# Patient Record
Sex: Male | Born: 1950 | Race: Black or African American | Hispanic: No | Marital: Single | Smoking: Former smoker
Health system: Southern US, Community
[De-identification: ages and names within clinical notes are randomized; demographics above are authoritative.]

## PROBLEM LIST (undated history)

## (undated) DIAGNOSIS — N183 Chronic kidney disease, stage 3 unspecified: Secondary | ICD-10-CM

## (undated) DIAGNOSIS — I340 Nonrheumatic mitral (valve) insufficiency: Secondary | ICD-10-CM

## (undated) DIAGNOSIS — F1011 Alcohol abuse, in remission: Secondary | ICD-10-CM

## (undated) DIAGNOSIS — I509 Heart failure, unspecified: Secondary | ICD-10-CM

## (undated) DIAGNOSIS — Z953 Presence of xenogenic heart valve: Secondary | ICD-10-CM

## (undated) DIAGNOSIS — I5042 Chronic combined systolic (congestive) and diastolic (congestive) heart failure: Secondary | ICD-10-CM

## (undated) DIAGNOSIS — I10 Essential (primary) hypertension: Secondary | ICD-10-CM

## (undated) DIAGNOSIS — C61 Malignant neoplasm of prostate: Secondary | ICD-10-CM

## (undated) HISTORY — PX: PROSTATE SURGERY: SHX751

## (undated) HISTORY — PX: APPENDECTOMY: SHX54

## (undated) HISTORY — DX: Chronic combined systolic (congestive) and diastolic (congestive) heart failure: I50.42

---

## 2016-04-26 ENCOUNTER — Emergency Department (HOSPITAL_COMMUNITY): Payer: Medicare Other

## 2016-04-26 ENCOUNTER — Encounter (HOSPITAL_COMMUNITY): Payer: Self-pay

## 2016-04-26 ENCOUNTER — Inpatient Hospital Stay (HOSPITAL_COMMUNITY)
Admission: EM | Admit: 2016-04-26 | Discharge: 2016-04-28 | DRG: 291 | Disposition: A | Discharge status: Home Health | Payer: Medicare Other | Attending: Cardiology | Admitting: Cardiology

## 2016-04-26 DIAGNOSIS — I130 Hypertensive heart and chronic kidney disease with heart failure and stage 1 through stage 4 chronic kidney disease, or unspecified chronic kidney disease: Secondary | ICD-10-CM | POA: Diagnosis not present

## 2016-04-26 DIAGNOSIS — I5021 Acute systolic (congestive) heart failure: Secondary | ICD-10-CM | POA: Diagnosis present

## 2016-04-26 DIAGNOSIS — R0602 Shortness of breath: Secondary | ICD-10-CM | POA: Diagnosis not present

## 2016-04-26 DIAGNOSIS — I5043 Acute on chronic combined systolic (congestive) and diastolic (congestive) heart failure: Secondary | ICD-10-CM | POA: Diagnosis present

## 2016-04-26 DIAGNOSIS — I340 Nonrheumatic mitral (valve) insufficiency: Secondary | ICD-10-CM | POA: Diagnosis present

## 2016-04-26 DIAGNOSIS — I272 Other secondary pulmonary hypertension: Secondary | ICD-10-CM | POA: Diagnosis present

## 2016-04-26 DIAGNOSIS — I472 Ventricular tachycardia: Secondary | ICD-10-CM | POA: Diagnosis present

## 2016-04-26 DIAGNOSIS — D638 Anemia in other chronic diseases classified elsewhere: Secondary | ICD-10-CM | POA: Diagnosis present

## 2016-04-26 DIAGNOSIS — N183 Chronic kidney disease, stage 3 unspecified: Secondary | ICD-10-CM | POA: Diagnosis present

## 2016-04-26 DIAGNOSIS — Z823 Family history of stroke: Secondary | ICD-10-CM

## 2016-04-26 DIAGNOSIS — I509 Heart failure, unspecified: Secondary | ICD-10-CM

## 2016-04-26 DIAGNOSIS — Z8546 Personal history of malignant neoplasm of prostate: Secondary | ICD-10-CM

## 2016-04-26 DIAGNOSIS — I5031 Acute diastolic (congestive) heart failure: Secondary | ICD-10-CM | POA: Diagnosis present

## 2016-04-26 DIAGNOSIS — I071 Rheumatic tricuspid insufficiency: Secondary | ICD-10-CM | POA: Diagnosis present

## 2016-04-26 DIAGNOSIS — Z7982 Long term (current) use of aspirin: Secondary | ICD-10-CM

## 2016-04-26 DIAGNOSIS — Z87891 Personal history of nicotine dependence: Secondary | ICD-10-CM

## 2016-04-26 DIAGNOSIS — E785 Hyperlipidemia, unspecified: Secondary | ICD-10-CM | POA: Diagnosis present

## 2016-04-26 DIAGNOSIS — Z8249 Family history of ischemic heart disease and other diseases of the circulatory system: Secondary | ICD-10-CM

## 2016-04-26 DIAGNOSIS — I5023 Acute on chronic systolic (congestive) heart failure: Secondary | ICD-10-CM | POA: Diagnosis present

## 2016-04-26 DIAGNOSIS — N179 Acute kidney failure, unspecified: Secondary | ICD-10-CM | POA: Diagnosis present

## 2016-04-26 DIAGNOSIS — E7800 Pure hypercholesterolemia, unspecified: Secondary | ICD-10-CM | POA: Diagnosis present

## 2016-04-26 HISTORY — DX: Chronic kidney disease, stage 3 unspecified: N18.30

## 2016-04-26 HISTORY — DX: Malignant neoplasm of prostate: C61

## 2016-04-26 HISTORY — DX: Nonrheumatic mitral (valve) insufficiency: I34.0

## 2016-04-26 HISTORY — DX: Heart failure, unspecified: I50.9

## 2016-04-26 HISTORY — DX: Essential (primary) hypertension: I10

## 2016-04-26 HISTORY — DX: Chronic kidney disease, stage 3 (moderate): N18.3

## 2016-04-26 HISTORY — DX: Alcohol abuse, in remission: F10.11

## 2016-04-26 LAB — CBC WITH DIFFERENTIAL/PLATELET
BASOS PCT: 0 %
Basophils Absolute: 0 10*3/uL (ref 0.0–0.1)
Eosinophils Absolute: 0 10*3/uL (ref 0.0–0.7)
Eosinophils Relative: 1 %
HEMATOCRIT: 33.3 % — AB (ref 39.0–52.0)
HEMOGLOBIN: 10.8 g/dL — AB (ref 13.0–17.0)
LYMPHS PCT: 27 %
Lymphs Abs: 1.3 10*3/uL (ref 0.7–4.0)
MCH: 34.2 pg — ABNORMAL HIGH (ref 26.0–34.0)
MCHC: 32.4 g/dL (ref 30.0–36.0)
MCV: 105.4 fL — AB (ref 78.0–100.0)
MONO ABS: 0.6 10*3/uL (ref 0.1–1.0)
MONOS PCT: 11 %
NEUTROS ABS: 3 10*3/uL (ref 1.7–7.7)
NEUTROS PCT: 61 %
Platelets: 191 10*3/uL (ref 150–400)
RBC: 3.16 MIL/uL — ABNORMAL LOW (ref 4.22–5.81)
RDW: 13.8 % (ref 11.5–15.5)
WBC: 4.9 10*3/uL (ref 4.0–10.5)

## 2016-04-26 LAB — I-STAT TROPONIN, ED
Troponin i, poc: 0.03 ng/mL (ref 0.00–0.08)
Troponin i, poc: 0.04 ng/mL (ref 0.00–0.08)

## 2016-04-26 LAB — COMPREHENSIVE METABOLIC PANEL
ALBUMIN: 2.8 g/dL — AB (ref 3.5–5.0)
ALK PHOS: 56 U/L (ref 38–126)
ALT: 20 U/L (ref 17–63)
ANION GAP: 10 (ref 5–15)
AST: 33 U/L (ref 15–41)
BILIRUBIN TOTAL: 1 mg/dL (ref 0.3–1.2)
BUN: 34 mg/dL — AB (ref 6–20)
CALCIUM: 8.9 mg/dL (ref 8.9–10.3)
CO2: 24 mmol/L (ref 22–32)
Chloride: 100 mmol/L — ABNORMAL LOW (ref 101–111)
Creatinine, Ser: 1.66 mg/dL — ABNORMAL HIGH (ref 0.61–1.24)
GFR calc Af Amer: 48 mL/min — ABNORMAL LOW (ref >60)
GFR, EST NON AFRICAN AMERICAN: 42 mL/min — AB (ref >60)
GLUCOSE: 92 mg/dL (ref 65–99)
Potassium: 4.8 mmol/L (ref 3.5–5.1)
Sodium: 134 mmol/L — ABNORMAL LOW (ref 135–145)
TOTAL PROTEIN: 6.9 g/dL (ref 6.5–8.1)

## 2016-04-26 LAB — BRAIN NATRIURETIC PEPTIDE: B NATRIURETIC PEPTIDE 5: 2761.6 pg/mL — AB (ref 0.0–100.0)

## 2016-04-26 MED ORDER — ACETAMINOPHEN 325 MG PO TABS
650.0000 mg | ORAL_TABLET | ORAL | Status: DC | PRN
  Administered 2016-04-27: 650 mg via ORAL
  Filled 2016-04-26: qty 2

## 2016-04-26 MED ORDER — HEPARIN SODIUM (PORCINE) 5000 UNIT/ML IJ SOLN
5000.0000 [IU] | Freq: Three times a day (TID) | INTRAMUSCULAR | Status: DC
  Administered 2016-04-26 – 2016-04-28 (×5): 5000 [IU] via SUBCUTANEOUS
  Filled 2016-04-26 (×5): qty 1

## 2016-04-26 MED ORDER — FUROSEMIDE 10 MG/ML IJ SOLN
40.0000 mg | Freq: Once | INTRAMUSCULAR | Status: AC
  Administered 2016-04-26: 40 mg via INTRAVENOUS
  Filled 2016-04-26: qty 4

## 2016-04-26 MED ORDER — ISOSORBIDE MONONITRATE ER 30 MG PO TB24
30.0000 mg | ORAL_TABLET | Freq: Every day | ORAL | Status: DC
  Administered 2016-04-27 – 2016-04-28 (×2): 30 mg via ORAL
  Filled 2016-04-26 (×2): qty 1

## 2016-04-26 MED ORDER — FUROSEMIDE 10 MG/ML IJ SOLN
40.0000 mg | Freq: Two times a day (BID) | INTRAMUSCULAR | Status: DC
  Administered 2016-04-26 – 2016-04-27 (×2): 40 mg via INTRAVENOUS
  Filled 2016-04-26 (×2): qty 4

## 2016-04-26 MED ORDER — SODIUM CHLORIDE 0.9% FLUSH
3.0000 mL | Freq: Two times a day (BID) | INTRAVENOUS | Status: DC
  Administered 2016-04-27 – 2016-04-28 (×4): 3 mL via INTRAVENOUS

## 2016-04-26 MED ORDER — ADULT MULTIVITAMIN W/MINERALS CH
1.0000 | ORAL_TABLET | Freq: Every day | ORAL | Status: DC
  Administered 2016-04-27 – 2016-04-28 (×2): 1 via ORAL
  Filled 2016-04-26 (×2): qty 1

## 2016-04-26 MED ORDER — ASPIRIN EC 81 MG PO TBEC
81.0000 mg | DELAYED_RELEASE_TABLET | Freq: Every day | ORAL | Status: DC
  Administered 2016-04-27 – 2016-04-28 (×2): 81 mg via ORAL
  Filled 2016-04-26 (×2): qty 1

## 2016-04-26 MED ORDER — GUAIFENESIN-DM 100-10 MG/5ML PO SYRP
5.0000 mL | ORAL_SOLUTION | ORAL | Status: DC | PRN
  Administered 2016-04-26 – 2016-04-27 (×2): 5 mL via ORAL
  Filled 2016-04-26 (×2): qty 5

## 2016-04-26 MED ORDER — SODIUM CHLORIDE 0.9% FLUSH: 3.0000 mL | INTRAVENOUS | Status: DC | PRN

## 2016-04-26 MED ORDER — VITAMIN B-1 100 MG PO TABS
100.0000 mg | ORAL_TABLET | Freq: Every day | ORAL | Status: DC
  Administered 2016-04-27 – 2016-04-28 (×2): 100 mg via ORAL
  Filled 2016-04-26: qty 1

## 2016-04-26 MED ORDER — ATORVASTATIN CALCIUM 40 MG PO TABS
40.0000 mg | ORAL_TABLET | Freq: Every day | ORAL | Status: DC
  Administered 2016-04-27 – 2016-04-28 (×2): 40 mg via ORAL
  Filled 2016-04-26 (×2): qty 1

## 2016-04-26 MED ORDER — POTASSIUM CHLORIDE CRYS ER 20 MEQ PO TBCR
20.0000 meq | EXTENDED_RELEASE_TABLET | Freq: Two times a day (BID) | ORAL | Status: DC
  Administered 2016-04-26 – 2016-04-28 (×4): 20 meq via ORAL
  Filled 2016-04-26 (×4): qty 1

## 2016-04-26 MED ORDER — CARVEDILOL 3.125 MG PO TABS
3.1250 mg | ORAL_TABLET | Freq: Two times a day (BID) | ORAL | Status: DC
  Administered 2016-04-26 – 2016-04-28 (×4): 3.125 mg via ORAL
  Filled 2016-04-26 (×5): qty 1

## 2016-04-26 MED ORDER — SODIUM CHLORIDE 0.9 % IV SOLN: 250.0000 mL | INTRAVENOUS | Status: DC | PRN

## 2016-04-26 MED ORDER — ONDANSETRON HCL 4 MG/2ML IJ SOLN: 4.0000 mg | Freq: Four times a day (QID) | INTRAMUSCULAR | Status: DC | PRN

## 2016-04-26 MED ORDER — HYDRALAZINE HCL 25 MG PO TABS
25.0000 mg | ORAL_TABLET | Freq: Three times a day (TID) | ORAL | Status: DC
  Administered 2016-04-26: 25 mg via ORAL
  Filled 2016-04-26 (×2): qty 1

## 2016-04-26 MED ORDER — FOLIC ACID 1 MG PO TABS
1.0000 mg | ORAL_TABLET | Freq: Every day | ORAL | Status: DC
  Administered 2016-04-27 – 2016-04-28 (×2): 1 mg via ORAL
  Filled 2016-04-26 (×2): qty 1

## 2016-04-26 NOTE — ED Triage Notes (Signed)
Pt presents with increasing shortness of breath and facial edema since being discharged from [facility] on 8/23.  Pt had follow up appointment yesterday, family was called this morning with BNP over 4000.  Pt reports intermittent chest pressure and dry cough.  Lasix started yesterday.

## 2016-04-26 NOTE — ED Notes (Signed)
Report attempted 

## 2016-04-26 NOTE — H&P (Signed)
Patient ID: Aaron Bennett MRN: [id number], DOB/AGE: 65-Jun-1952   Admit date: 04/26/2016   Primary Physician: No primary care provider on file. Primary Cardiologist: New  Pt. Profile:  Aaron Bennett is a 65 y.o. male with a history of HTN, HLD, prostate cancer, CKD stage 3 and recently dx CHF who presented to [facility] ED for evaluation of worsening shortness of breath.  HPI: As above. The patient was a heavy alcohol drinker quit in July 2017. He was admitted to [facility] 8/20- 8/23 for CHF. He was initially presented for altered mental status and worsening shortness of breath. Episode of  Alcohol withdrawal seizure.  He was treated with IV diuretics and this did not prescribe at discharge due to increased creatinine. Echocardiogram shows slow ventricular function of 30-35% with severe MR and TR. Advised to follow-up with cardiology as outpatient. The patient was started on Imdur, spironolactone, Coreg, statin, aspirin and discharged home.                                  Patient yesterday seen by primary care provider and complained of shortness of breath and Lasix was started.  Patient came to live [place] with her sister after PCP visit. Lastand had a worsening shortness of breath with orthopnea and PND. His lower extremity edema improved per sister. Family was called this morning with BNP over 4000 leading to ER presentation. Patient admits to having intermittent chest pain mostly with cough, improved lower extremity edema and abdominal fullness. The patient denies syncope, melena or blood in his stool or urine.  Workup revealed BNP of 2761. Point-of-care troponin negative x 2. Serum creatinine of 1.6 his GFR 48. Hemoglobin of 10.8. Chest x-ray shows evidence of CHF.  50 pack year history of tobacco smoking. Last smoke prior to recent admission.  Problem List  Past Medical History:  Diagnosis Date  . CHF (congestive heart failure) ([place])   . CKD (chronic kidney disease)  stage 3, GFR 30-59 ml/min   . H/O ETOH abuse   . Hypertension   . Prostate cancer [facility])     Past Surgical History:  Procedure Laterality Date  . APPENDECTOMY    . PROSTATE SURGERY       Allergies  No Known Allergies   Home Medications  Prior to Admission medications   Medication Sig Start Date End Date Taking? Authorizing Provider  aspirin EC 81 MG tablet Take 81 mg by mouth daily.   Yes Historical Provider, MD  atorvastatin (LIPITOR) 40 MG tablet Take 40 mg by mouth daily.   Yes Historical Provider, MD  carvedilol (COREG) 3.125 MG tablet Take 3.125 mg by mouth 2 (two) times daily with a meal.   Yes Historical Provider, MD  folic acid (FOLVITE) 1 MG tablet Take 1 mg by mouth daily.   Yes Historical Provider, MD  furosemide (LASIX) 20 MG tablet Take 20 mg by mouth daily.   Yes Historical Provider, MD  isosorbide mononitrate (IMDUR) 30 MG 24 hr tablet Take 30 mg by mouth daily.   Yes Historical Provider, MD  Multiple Vitamin (MULTIVITAMIN) tablet Take 1 tablet by mouth daily.   Yes Historical Provider, MD  potassium chloride SA (K-DUR,KLOR-CON) 20 MEQ tablet Take 20 mEq by mouth 2 (two) times daily.   Yes Historical Provider, MD  spironolactone (ALDACTONE) 25 MG tablet Take 25 mg by mouth daily.   Yes Historical Provider, MD  thiamine (VITAMIN  B-1) 100 MG tablet Take 100 mg by mouth daily.   Yes Historical Provider, MD    Family History  Family History  Problem Relation Age of Onset  . Lung disease Mother   . Stroke Father 55  . Yves Dill Parkinson White syndrome Brother    Family Status  Relation Status  . Mother Deceased at age 59  . Father Deceased at age 1  . Brother Deceased at age 52   with WPW    Social History  Social History   Social History  . Marital status: Single    Spouse name: N/A  . Number of children: N/A  . Years of education: N/A   Occupational History  . Not on file.   Social History Main Topics  . Smoking status: Former Smoker     Packs/day: 1.00    Years: 50.00    Quit date: 04/16/2016  . Smokeless tobacco: Never Used  . Alcohol use Yes     Comment: 1/2 pine  . Drug use: Unknown  . Sexual activity: Not on file   Other Topics Concern  . Not on file   Social History Narrative  . No narrative on file      All other systems reviewed and are otherwise negative except as noted above.  Physical Exam  Blood pressure 117/86, pulse 96, temperature 97.9 F (36.6 C), temperature source Oral, resp. rate 18, height 5\' 11"  (1.803 m), weight 152 lb (68.9 kg), SpO2 100 %.  General: Frail NAD Psych: Normal affect. Neuro: Alert and oriented X 3. Moves all extremities spontaneously. HEENT: Normal  Neck: Supple without bruits. + JVD Lungs:  Resp regular and unlabored.  Diminished breath sounds with faint bibasilar rales. Heart: RRR no s3, s4. Systolic murmurs III/VI. Abdomen: Soft, non-tender, non-distended, BS + x 4.  Extremities: No clubbing, cyanosis. Trace to 1+ edema. DP/PT/Radials 2+ and equal bilaterally.  Labs  No results for input(s): CKTOTAL, CKMB, TROPONINI in the last 72 hours. Lab Results  Component Value Date   WBC 4.9 04/26/2016   HGB 10.8 (L) 04/26/2016   HCT 33.3 (L) 04/26/2016   MCV 105.4 (H) 04/26/2016   PLT 191 04/26/2016     Recent Labs Lab 04/26/16 1129  NA 134*  K 4.8  CL 100*  CO2 24  BUN 34*  CREATININE 1.66*  CALCIUM 8.9  PROT 6.9  BILITOT 1.0  ALKPHOS 56  ALT 20  AST 33  GLUCOSE 92   No results found for: CHOL, HDL, LDLCALC, TRIG No results found for: DDIMER   Radiology/Studies  Dg Chest 2 View  Result Date: 04/26/2016 CLINICAL DATA:  Cough for a week. EXAM: CHEST  2 VIEW COMPARISON:  None. FINDINGS: Cardiac silhouette is mild-to-moderately enlarged. No mediastinal or hilar masses or evidence of adenopathy. There are thickened interstitial markings bilaterally. No focal lung consolidation. No pleural effusion or pneumothorax. Skeletal structures are intact. IMPRESSION:  1. Findings consistent with congestive heart failure with interstitial pulmonary edema. No evidence of pneumonia. Electronically Signed   By: Lajean Manes M.D.   On: 04/26/2016 11:46    ECG  Pending  ASSESSMENT AND PLAN  1. Acute CHF - BNP of 2761 here. Recent admission for CHF however worsening shortness of breath with orthopnea and PND. He did felt better after "starting Lasix by PCP ". Records requested. 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 Given IV lasix 40mg  x 1 in ER with 635ml diuresis.Start him on IV Lasix 40 twice a day and get echocardiogram. Strict  I&O and daily wt. Discontinue spironolactone. Start hydralazine. Continue Imdur and Coreg.  2. Acute on CKD stage II likely - Follow kidney function closely with diuresis.   3. HTN - Stable  4. 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 Follow hemoglobin closely.  Signed, Leanor Kail, PA-C 04/26/2016, 2:32 PM Pager [phone]  Patient seen and examined and history reviewed. Agree with above findings and plan. 65 yo WM seen with his sister. History of long standing Etoh abuse (at least 1/2 pint/day for over 2 years). Admitted one week ago in [place] with acute CHF. Diuresed with IV lasix. Echo showed EF 30-35% with severe MR and TR. Also had CKD with creatinine 1.48. Discharged on Coreg, nitrates and aldactone but no lasix due to concern about renal function. Shortly after discharge he had recurrent symptoms of SOB, cough, orthopnea, and increased abdominal swelling and edema. Reports abstinence from alcohol this past week. Now moved here to live with his sister.  On exam patient is a pleasant BM in NAD. NSR Positive JVD to jaw Lungs with basilar rales.  CV with grade 3/6 holosystolic murmur at the apex. Prominent apical impulse. Abdomen distended. 1+ edema.  Labs reviewed as noted above. Creatinine 1.66. Albumin 2.8. CXR with CHF. BNP elevated  Impression: Acute on chronic systolic CHF. 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 I suspect severe MR and TR is secondary. He is decompensated and needs admission for IV lasix. Will stop aldactone with CKD. Continue Coreg. Will add nitrates and hydralazine. Follow renal indices closely. Will repeat Echo since he is planning to stay here with sister long term. Stressed importance of continued abstinence from [place].  Peter Martinique, [place] 04/26/2016 4:38 PM

## 2016-04-26 NOTE — ED Notes (Signed)
Meal tray ordered 

## 2016-04-26 NOTE — ED Provider Notes (Signed)
[facility] DEPT Provider Note   CSN: [id number] Arrival date & time: 04/26/16  1026     History   Chief Complaint No chief complaint on file.   HPI Aaron Bennett is a 65 y.o. male.  Patient is a 65 year old male with past medical history of hypertension, high cholesterol, and recently diagnosed congestive heart failure. He was recently admitted at [facility] and treated for CHF. He was apparently diuresed, then discharged. Over the past several days he has had increasing shortness of breath with exertion and orthopnea. He denies any fevers or chills or productive cough.    The history is provided by the patient.    Past Medical History:  Diagnosis Date  . CHF (congestive heart failure) ([place])   . H/O ETOH abuse   . Hypertension   . Prostate cancer ([place])     There are no active problems to display for this patient.   Past Surgical History:  Procedure Laterality Date  . APPENDECTOMY    . PROSTATE SURGERY         Home Medications    Prior to Admission medications   Medication Sig Start Date End Date Taking? Authorizing Provider  aspirin EC 81 MG tablet Take 81 mg by mouth daily.   Yes Historical Provider, MD  atorvastatin (LIPITOR) 40 MG tablet Take 40 mg by mouth daily.   Yes Historical Provider, MD  carvedilol (COREG) 3.125 MG tablet Take 3.125 mg by mouth 2 (two) times daily with a meal.   Yes Historical Provider, MD  folic acid (FOLVITE) 1 MG tablet Take 1 mg by mouth daily.   Yes Historical Provider, MD  furosemide (LASIX) 20 MG tablet Take 20 mg by mouth daily.   Yes Historical Provider, MD  isosorbide mononitrate (IMDUR) 30 MG 24 hr tablet Take 30 mg by mouth daily.   Yes Historical Provider, MD  Multiple Vitamin (MULTIVITAMIN) tablet Take 1 tablet by mouth daily.   Yes Historical Provider, MD  potassium chloride SA (K-DUR,KLOR-CON) 20 MEQ tablet Take 20 mEq by mouth 2 (two) times daily.   Yes Historical Provider, MD  spironolactone (ALDACTONE)  25 MG tablet Take 25 mg by mouth daily.   Yes Historical Provider, MD  thiamine (VITAMIN B-1) 100 MG tablet Take 100 mg by mouth daily.   Yes Historical Provider, MD    Family History History reviewed. No pertinent family history.  Social History Social History  Substance Use Topics  . Smoking status: Former Research scientist (life sciences)  . Smokeless tobacco: Never Used  . Alcohol use Yes     Allergies   Review of patient's allergies indicates no known allergies.   Review of Systems Review of Systems  All other systems reviewed and are negative.    Physical Exam Updated Vital Signs BP 117/86   Pulse 96   Temp 97.9 F (36.6 C) (Oral)   Resp 18   Ht 5\' 11"  (1.803 m)   Wt 152 lb (68.9 kg)   SpO2 100%   BMI 21.20 kg/m   Physical Exam  Constitutional: He is oriented to person, place, and time. He appears well-developed and well-nourished. No distress.  HENT:  Head: Normocephalic and atraumatic.  Mouth/Throat: Oropharynx is clear and moist.  Neck: Normal range of motion. Neck supple.  Cardiovascular: Normal rate and regular rhythm.  Exam reveals no friction rub.   No murmur heard. Pulmonary/Chest: Effort normal. No respiratory distress. He has no wheezes. He has rales.  There are slight rales in the bases bilaterally.  Abdominal: Soft. Bowel sounds are normal. He exhibits no distension. There is no tenderness.  Musculoskeletal: Normal range of motion. He exhibits edema.  There is 1+ pitting edema of both lower extremities.  Neurological: He is alert and oriented to person, place, and time. Coordination normal.  Skin: Skin is warm and dry. He is not diaphoretic.  Nursing note and vitals reviewed.    ED Treatments / Results  Labs (all labs ordered are listed, but only abnormal results are displayed) Labs Reviewed  CBC WITH DIFFERENTIAL/PLATELET - Abnormal; Notable for the following:       Result Value   RBC 3.16 (*)    Hemoglobin 10.8 (*)    HCT 33.3 (*)    MCV 105.4 (*)    MCH  34.2 (*)    All other components within normal limits  COMPREHENSIVE METABOLIC PANEL - Abnormal; Notable for the following:    Sodium 134 (*)    Chloride 100 (*)    BUN 34 (*)    Creatinine, Ser 1.66 (*)    Albumin 2.8 (*)    GFR calc non Af Amer 42 (*)    GFR calc Af Amer 48 (*)    All other components within normal limits  BRAIN NATRIURETIC PEPTIDE - Abnormal; Notable for the following:    B Natriuretic Peptide 2,761.6 (*)    All other components within normal limits  I-STAT TROPOININ, ED  Randolm Idol, ED    EKG  EKG Interpretation  Date/Time:  Wednesday April 26 2016 14:56:38 EDT Ventricular Rate:  91 PR Interval:    QRS Duration: 119 QT Interval:  372 QTC Calculation: 458 R Axis:   62 Text Interpretation:  Sinus rhythm Incomplete right bundle branch block Confirmed by Aboubacar Matsuo  MD, Rakeen Gaillard ([id number]) on 04/26/2016 3:01:26 PM       Radiology Dg Chest 2 View  Result Date: 04/26/2016 CLINICAL DATA:  Cough for a week. EXAM: CHEST  2 VIEW COMPARISON:  None. FINDINGS: Cardiac silhouette is mild-to-moderately enlarged. No mediastinal or hilar masses or evidence of adenopathy. There are thickened interstitial markings bilaterally. No focal lung consolidation. No pleural effusion or pneumothorax. Skeletal structures are intact. IMPRESSION: 1. Findings consistent with congestive heart failure with interstitial pulmonary edema. No evidence of pneumonia. Electronically Signed   By: Lajean Manes M.D.   On: 04/26/2016 11:46    Procedures Procedures (including critical care time)  Medications Ordered in ED Medications  furosemide (LASIX) injection 40 mg (not administered)     Initial Impression / Assessment and Plan / ED Course  I have reviewed the triage vital signs and the nursing notes.  Pertinent labs & imaging results that were available during my care of the patient were reviewed by me and considered in my medical decision making (see chart for details).  Clinical  Course    Patient presents with complaints of dyspnea on exertion and orthopnea worsening over the past several days. He was recently admitted for congestive heart failure at [facility]. His workup today reveals a markedly elevated BNP along with evidence for pulmonary edema on his chest x-ray. He was given IV Lasix. I've spoken with cardiology who will evaluate and likely admit.  Final Clinical Impressions(s) / ED Diagnoses   Final diagnoses:  None    New Prescriptions New Prescriptions   No medications on file     Veryl Speak, MD 04/26/16 1502

## 2016-04-27 ENCOUNTER — Observation Stay (HOSPITAL_COMMUNITY): Payer: Medicare Other

## 2016-04-27 DIAGNOSIS — N179 Acute kidney failure, unspecified: Secondary | ICD-10-CM | POA: Diagnosis present

## 2016-04-27 DIAGNOSIS — R0602 Shortness of breath: Secondary | ICD-10-CM | POA: Diagnosis present

## 2016-04-27 DIAGNOSIS — I272 Other secondary pulmonary hypertension: Secondary | ICD-10-CM | POA: Diagnosis present

## 2016-04-27 DIAGNOSIS — Z87891 Personal history of nicotine dependence: Secondary | ICD-10-CM | POA: Diagnosis not present

## 2016-04-27 DIAGNOSIS — I340 Nonrheumatic mitral (valve) insufficiency: Secondary | ICD-10-CM | POA: Diagnosis present

## 2016-04-27 DIAGNOSIS — I130 Hypertensive heart and chronic kidney disease with heart failure and stage 1 through stage 4 chronic kidney disease, or unspecified chronic kidney disease: Secondary | ICD-10-CM | POA: Diagnosis present

## 2016-04-27 DIAGNOSIS — I472 Ventricular tachycardia: Secondary | ICD-10-CM | POA: Diagnosis present

## 2016-04-27 DIAGNOSIS — I509 Heart failure, unspecified: Secondary | ICD-10-CM

## 2016-04-27 DIAGNOSIS — I5021 Acute systolic (congestive) heart failure: Secondary | ICD-10-CM | POA: Diagnosis present

## 2016-04-27 DIAGNOSIS — E785 Hyperlipidemia, unspecified: Secondary | ICD-10-CM | POA: Diagnosis present

## 2016-04-27 DIAGNOSIS — D638 Anemia in other chronic diseases classified elsewhere: Secondary | ICD-10-CM | POA: Diagnosis present

## 2016-04-27 DIAGNOSIS — Z8546 Personal history of malignant neoplasm of prostate: Secondary | ICD-10-CM | POA: Diagnosis not present

## 2016-04-27 DIAGNOSIS — Z8249 Family history of ischemic heart disease and other diseases of the circulatory system: Secondary | ICD-10-CM | POA: Diagnosis not present

## 2016-04-27 DIAGNOSIS — N183 Chronic kidney disease, stage 3 (moderate): Secondary | ICD-10-CM | POA: Diagnosis present

## 2016-04-27 DIAGNOSIS — I5043 Acute on chronic combined systolic (congestive) and diastolic (congestive) heart failure: Secondary | ICD-10-CM | POA: Diagnosis present

## 2016-04-27 DIAGNOSIS — E7800 Pure hypercholesterolemia, unspecified: Secondary | ICD-10-CM | POA: Diagnosis present

## 2016-04-27 DIAGNOSIS — Z7982 Long term (current) use of aspirin: Secondary | ICD-10-CM | POA: Diagnosis not present

## 2016-04-27 DIAGNOSIS — I071 Rheumatic tricuspid insufficiency: Secondary | ICD-10-CM | POA: Diagnosis present

## 2016-04-27 DIAGNOSIS — Z823 Family history of stroke: Secondary | ICD-10-CM | POA: Diagnosis not present

## 2016-04-27 LAB — BASIC METABOLIC PANEL
ANION GAP: 9 (ref 5–15)
BUN: 42 mg/dL — ABNORMAL HIGH (ref 6–20)
CHLORIDE: 97 mmol/L — AB (ref 101–111)
CO2: 28 mmol/L (ref 22–32)
Calcium: 9 mg/dL (ref 8.9–10.3)
Creatinine, Ser: 2.06 mg/dL — ABNORMAL HIGH (ref 0.61–1.24)
GFR calc non Af Amer: 32 mL/min — ABNORMAL LOW (ref >60)
GFR, EST AFRICAN AMERICAN: 37 mL/min — AB (ref >60)
Glucose, Bld: 100 mg/dL — ABNORMAL HIGH (ref 65–99)
POTASSIUM: 5.1 mmol/L (ref 3.5–5.1)
Sodium: 134 mmol/L — ABNORMAL LOW (ref 135–145)

## 2016-04-27 LAB — ECHOCARDIOGRAM COMPLETE
HEIGHTINCHES: 71 in
Weight: 2281.6 oz

## 2016-04-27 MED ORDER — HYDRALAZINE HCL 25 MG PO TABS
25.0000 mg | ORAL_TABLET | Freq: Three times a day (TID) | ORAL | Status: DC
  Administered 2016-04-27: 25 mg via ORAL
  Filled 2016-04-27 (×3): qty 1

## 2016-04-27 MED ORDER — FUROSEMIDE 40 MG PO TABS
40.0000 mg | ORAL_TABLET | Freq: Every day | ORAL | Status: DC
  Administered 2016-04-28: 40 mg via ORAL
  Filled 2016-04-27: qty 1

## 2016-04-27 NOTE — Progress Notes (Signed)
Notified oncall PA Rhonda Barrett that patient's bp is running low. She gave a verbal order to hold hydralazine 25mg  for systolic bp less than [id number]. Held 6am hydralazine

## 2016-04-27 NOTE — Progress Notes (Signed)
Patient Name: Goran Fineran Date of Encounter: 04/27/2016     Principal Problem:   Acute on chronic systolic CHF (congestive heart failure) ([place]) Active Problems:   Acute CHF (congestive heart failure) ([facility])   Severe mitral insufficiency   Tricuspid valve insufficiency   Anemia of chronic disease   CKD (chronic kidney disease) stage 3, GFR 30-59 ml/min    SUBJECTIVE  No complaints. breathing a lot better. Still with cough.  CURRENT MEDS . aspirin EC  81 mg Oral Daily  . atorvastatin  40 mg Oral Daily  . carvedilol  3.125 mg Oral BID WC  . folic acid  1 mg Oral Daily  . furosemide  40 mg Intravenous BID  . heparin  5,000 Units Subcutaneous Q8H  . hydrALAZINE  25 mg Oral Q8H  . isosorbide mononitrate  30 mg Oral Daily  . multivitamin with minerals  1 tablet Oral Daily  . potassium chloride SA  20 mEq Oral BID  . sodium chloride flush  3 mL Intravenous Q12H  . thiamine  100 mg Oral Daily    OBJECTIVE  Vitals:   04/26/16 2348 04/27/16 0556 04/27/16 0834 04/27/16 0856  BP: 92/63 93/64 108/68 107/63  Pulse: 100 90 96 88  Resp: 18 18 18 18   Temp: 98.6 F (37 C) 97.9 F (36.6 C) 97.8 F (36.6 C) 97.5 F (36.4 C)  TempSrc: Oral Oral Oral Oral  SpO2: 98% 98% 98% 99%  Weight:  142 lb 9.6 oz (64.7 kg)    Height:        Intake/Output Summary (Last 24 hours) at 04/27/16 1050 Last data filed at 04/27/16 1015  Gross per 24 hour  Intake              563 ml  Output             2845 ml  Net            -2282 ml   Filed Weights   04/26/16 1036 04/26/16 1748 04/27/16 0556  Weight: 152 lb (68.9 kg) 146 lb 1.6 oz (66.3 kg) 142 lb 9.6 oz (64.7 kg)    PHYSICAL EXAM  General: Pleasant, NAD. Neuro: Alert and oriented X 3. Moves all extremities spontaneously. Psych: Normal affect. HEENT:  Normal  Neck: Supple without bruits or JVD. Lungs:  Resp regular and unlabored, CTA. Heart: RRR with grade 3/6 holosystolic murmur at the apex. Prominent apical impulse. Abdomen:  Soft, non-tender, +distended, BS + x 4.  Extremities: No clubbing, cyanosis or edema. DP/PT/Radials 2+ and equal bilaterally.   Accessory Clinical Findings  CBC  Recent Labs  04/26/16 1129  WBC 4.9  NEUTROABS 3.0  HGB 10.8*  HCT 33.3*  MCV 105.4*  PLT 99991111   Basic Metabolic Panel  Recent Labs  04/26/16 1129 04/27/16 0431  NA 134* 134*  K 4.8 5.1  CL 100* 97*  CO2 24 28  GLUCOSE 92 100*  BUN 34* 42*  CREATININE 1.66* 2.06*  CALCIUM 8.9 9.0   Liver Function Tests  Recent Labs  04/26/16 1129  AST 33  ALT 20  ALKPHOS 56  BILITOT 1.0  PROT 6.9  ALBUMIN 2.8*    TELE  NSR with PVCs and NSVT: 17 beats  Radiology/Studies  Dg Chest 2 View  Result Date: 04/26/2016 CLINICAL DATA:  Cough for a week. EXAM: CHEST  2 VIEW COMPARISON:  None. FINDINGS: Cardiac silhouette is mild-to-moderately enlarged. No mediastinal or hilar masses or evidence of adenopathy. There are thickened  interstitial markings bilaterally. No focal lung consolidation. No pleural effusion or pneumothorax. Skeletal structures are intact. IMPRESSION: 1. Findings consistent with congestive heart failure with interstitial pulmonary edema. No evidence of pneumonia. Electronically Signed   By: Lajean Manes M.D.   On: 04/26/2016 11:46    ASSESSMENT AND PLAN  Jacarie Bouknight is a 65 y.o. male with a history of HTN, HLD, prostate cancer, CKD stage 3, alcoholism (quit 02/2016), tobacco abuse and recently dx systolic CHF with severe MR/TR who presented to [facility] ED 04/26/16 for evaluation of worsening shortness of breath.  Acute on chronic systolic CHF: likely 2/2 alcoholic CM and suspect severe MR/TR are secondary.  -- Recently admitted in [place] for new onset CHF c/b alcohol withdrawal seizure. 2D ECHO with EF 30-35% severe MR and TR. He was treated with IV diuretics but no po diuretics prescribed at discharge due to increased creatinine. He returned with volume overload. -- BNP elevated 2761. CXR c/w  CHF. -- Placed on IV lasix 40mg  BID. Net neg 2.3L and weight down 10 lbs (152--> 142lbs).   -- Worsening creat 1.66--> 2.06. He has had brisk diuresis on IV lasix. With worsening renal function, I will convert him to PO lasix 40mg  daily and continue to watch creat.  -- Repeat 2D ECHO here pending. -- Continue Coreg 3.125mg  BID and hydralazine/nitrates. Bps have been soft. Arlyce Harman discontinued due to AKI.  CKD: worsening renal function with diuresis: 1.66--> 2.06. As above, will convert to PO lasix 40mg  daily.   NSVT: 17 beats. Continue Coreg. Repeat 2D ECHO pending. If EF <35% we may need to think about ICD. 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 Troponins have remained negative.   HLD: continue statin   Alcohol and tobacco abuse: he has quit both since last admission. Stressed importance of complete cessation.  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 Follow hemoglobin closely.  HTN: stable  Signed, Angelena Form PA-C  Pager [phone] Patient seen and examined and history reviewed. Agree with above findings and plan. Patient is much better today. Breathing is improved and edema resolved. Good response to IV lasix. Creatinine is higher. Agree with switching lasix to po. Continue Coreg, nitrates, hydralazine. Echo pending. Discussed sodium restriction, monitoring weight, medication compliance. He will be staying with his sister who is a Marine scientist.   Havard Radigan [place], [place] 04/27/2016 12:30 PM

## 2016-04-27 NOTE — Progress Notes (Addendum)
Notified MD Martin[place] that patient had 16 beats of Vtach. No order were given. Patient remained asymptomatic. Will continue to monitor for patient safety

## 2016-04-27 NOTE — Progress Notes (Signed)
  Echocardiogram 2D Echocardiogram has been performed.  Jennette Dubin 04/27/2016, 1:42 PM

## 2016-04-27 NOTE — Progress Notes (Signed)
Patient had a 11 beat run of Vtach. He is asymptomatic. Physician has been notified.

## 2016-04-28 ENCOUNTER — Telehealth: Payer: Self-pay | Admitting: Cardiology

## 2016-04-28 ENCOUNTER — Encounter (HOSPITAL_COMMUNITY): Payer: Self-pay | Admitting: Student

## 2016-04-28 DIAGNOSIS — I272 Other secondary pulmonary hypertension: Secondary | ICD-10-CM

## 2016-04-28 DIAGNOSIS — I5031 Acute diastolic (congestive) heart failure: Secondary | ICD-10-CM

## 2016-04-28 LAB — BASIC METABOLIC PANEL
ANION GAP: 10 (ref 5–15)
BUN: 36 mg/dL — ABNORMAL HIGH (ref 6–20)
CALCIUM: 8.7 mg/dL — AB (ref 8.9–10.3)
CO2: 25 mmol/L (ref 22–32)
Chloride: 99 mmol/L — ABNORMAL LOW (ref 101–111)
Creatinine, Ser: 1.77 mg/dL — ABNORMAL HIGH (ref 0.61–1.24)
GFR, EST AFRICAN AMERICAN: 45 mL/min — AB (ref >60)
GFR, EST NON AFRICAN AMERICAN: 39 mL/min — AB (ref >60)
Glucose, Bld: 97 mg/dL (ref 65–99)
POTASSIUM: 4.9 mmol/L (ref 3.5–5.1)
Sodium: 134 mmol/L — ABNORMAL LOW (ref 135–145)

## 2016-04-28 MED ORDER — FUROSEMIDE 40 MG PO TABS: 40.0000 mg | ORAL_TABLET | Freq: Every day | ORAL | 6 refills | Status: DC

## 2016-04-28 MED ORDER — HYDRALAZINE HCL 10 MG PO TABS: 10.0000 mg | ORAL_TABLET | Freq: Three times a day (TID) | ORAL | Status: DC

## 2016-04-28 MED ORDER — HYDRALAZINE HCL 10 MG PO TABS: 10.0000 mg | ORAL_TABLET | Freq: Three times a day (TID) | ORAL | 6 refills | Status: DC

## 2016-04-28 NOTE — Telephone Encounter (Signed)
New message        TCM appt on 05-05-16 with Kerin Ransom

## 2016-04-28 NOTE — Progress Notes (Signed)
Patient Name: Aaron Bennett Date of Encounter: 04/28/2016     Principal Problem:   Acute on chronic systolic CHF (congestive heart failure) ([place]) Active Problems:   Acute CHF (congestive heart failure) (HCC)   Severe mitral insufficiency   Tricuspid valve insufficiency   Anemia of chronic disease   CKD (chronic kidney disease) stage 3, GFR 30-59 ml/min   Acute systolic CHF (congestive heart failure) (HCC)    SUBJECTIVE  No complaints. breathing a lot better.   CURRENT MEDS . aspirin EC  81 mg Oral Daily  . atorvastatin  40 mg Oral Daily  . carvedilol  3.125 mg Oral BID WC  . folic acid  1 mg Oral Daily  . furosemide  40 mg Oral Daily  . heparin  5,000 Units Subcutaneous Q8H  . hydrALAZINE  10 mg Oral Q8H  . isosorbide mononitrate  30 mg Oral Daily  . multivitamin with minerals  1 tablet Oral Daily  . sodium chloride flush  3 mL Intravenous Q12H  . thiamine  100 mg Oral Daily    OBJECTIVE  Vitals:   04/28/16 0624 04/28/16 0637 04/28/16 0641 04/28/16 0835  BP:  97/71 92/60 100/69  Pulse:  85 89 95  Resp:      Temp:      TempSrc:      SpO2:      Weight: 141 lb 1.6 oz (64 kg)     Height:        Intake/Output Summary (Last 24 hours) at 04/28/16 1031 Last data filed at 04/28/16 0915  Gross per 24 hour  Intake             1680 ml  Output             1600 ml  Net               80 ml   Filed Weights   04/26/16 1748 04/27/16 0556 04/28/16 0624  Weight: 146 lb 1.6 oz (66.3 kg) 142 lb 9.6 oz (64.7 kg) 141 lb 1.6 oz (64 kg)    PHYSICAL EXAM  General: Pleasant, NAD. Neuro: Alert and oriented X 3. Moves all extremities spontaneously. Psych: Normal affect. HEENT:  Normal  Neck: Supple without bruits or JVD. Lungs:  Resp regular and unlabored, CTA. Heart: RRR with grade 3/6 holosystolic murmur at the apex. Prominent apical impulse. Abdomen: Soft, non-tender, +distended, BS + x 4.  Extremities: No clubbing, cyanosis or edema. DP/PT/Radials 2+ and equal  bilaterally.   Accessory Clinical Findings  CBC  Recent Labs  04/26/16 1129  WBC 4.9  NEUTROABS 3.0  HGB 10.8*  HCT 33.3*  MCV 105.4*  PLT 99991111   Basic Metabolic Panel  Recent Labs  04/27/16 0431 04/28/16 0434  NA 134* 134*  K 5.1 4.9  CL 97* 99*  CO2 28 25  GLUCOSE 100* 97  BUN 42* 36*  CREATININE 2.06* 1.77*  CALCIUM 9.0 8.7*   Liver Function Tests  Recent Labs  04/26/16 1129  AST 33  ALT 20  ALKPHOS 56  BILITOT 1.0  PROT 6.9  ALBUMIN 2.8*    TELE  NSR with PVCs and NSVT: 8-11 beats.  Radiology/Studies  Dg Chest 2 View  Result Date: 04/26/2016 CLINICAL DATA:  Cough for a week. EXAM: CHEST  2 VIEW COMPARISON:  None. FINDINGS: Cardiac silhouette is mild-to-moderately enlarged. No mediastinal or hilar masses or evidence of adenopathy. There are thickened interstitial markings bilaterally. No focal lung consolidation. No pleural effusion or  pneumothorax. Skeletal structures are intact. IMPRESSION: 1. Findings consistent with congestive heart failure with interstitial pulmonary edema. No evidence of pneumonia. Electronically Signed   By: Lajean Manes M.D.   On: 04/26/2016 11:46   Echo: Study Conclusions  - Left ventricle: The cavity size was normal. There was moderate   focal basal and moderate concentric hypertrophy of the left   ventricle. Systolic function was normal. The estimated ejection   fraction was in the range of 50% to 55%. Wall motion was normal;   there were no regional wall motion abnormalities. - Ventricular septum: The contour showed diastolic flattening and   systolic flattening. - Aortic valve: There was moderate regurgitation. - Mitral valve: There was severe regurgitation directed centrally   and posteriorly. Valve area by continuity equation (using LVOT   flow): 1.11 cm^2. - Left atrium: The atrium was moderately dilated. - Right ventricle: The cavity size was moderately dilated. Wall   thickness was normal. - Right atrium:  The atrium was severely dilated. - Tricuspid valve: There was moderate regurgitation. - Pulmonary arteries: Systolic pressure was severely increased. PA   peak pressure: 71 mm Hg (S).  Impressions:  - Low normal LVEF, however with severe MR most probably   overestimated.   Mitral leaflets are thickened and mildly calcified. There is   severe mitral regurgitation (RV = 58ml, ERO 50 mm2).   Moderate aortic regurgitation.   Moderately dilated RV with normal RVEF. Severe pulmonary   hypertension.  ASSESSMENT AND PLAN  Jaydrian Glad is a 65 y.o. male with a history of HTN, HLD, prostate cancer, CKD stage 3, alcoholism (quit 02/2016), tobacco abuse and recently dx systolic CHF with severe MR/TR who presented to [facility] ED 04/26/16 for evaluation of worsening shortness of breath.  Acute on chronic systolic CHF:  -- Recently admitted in [place] for new onset CHF c/b alcohol withdrawal seizure. 2D ECHO with EF 30-35% severe MR and TR. He was treated with IV diuretics but no po diuretics prescribed at discharge due to increased creatinine. He returned with volume overload. -- Echo here demonstrates preserved LV function with EF 50%. There is severe MR with severe pulmonary HTN. This makes it unlikely that this is an alcoholic cardiomyopathy. I think the MR is primary and resulting in CHF.  -- BNP elevated 2761. CXR c/w CHF. -- Placed on IV lasix 40mg  BID. Net neg 2.3L and weight down 11 lbs (152--> 141lbs).   -- Worsening creat 1.66--> 2.06. Now improved to 1.77 with change to po lasix and holding aldactone. -- Continue Coreg 3.125mg  BID and hydralazine/nitrates. Bps have been low so I will reduce hydralazine to 10 mg tid. Arlyce Harman discontinued due to AKI.  CKD: worsening renal function with diuresis: 1.66--> 2.06. Improved today 1.77.   NSVT: 8-17 beats. Continue Coreg. Repeat 2D ECHO pending. EF 50% so no indication for ICD or LifeVest. Patient is asymptomatic.   HLD: continue statin    Alcohol and tobacco abuse: he has quit both since last admission. Stressed importance of complete cessation.   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 Follow hemoglobin closely.  HTN: stable  Discussed findings of Echo with patient and his sister Letta Median. She is a Marine scientist and worked with Dr. Arlyce Dice here so is very knowledgeable. The patient will be staying with her from now on. I think the patient will need to be considered for MV repair. To complete work up he will need TEE and [facility]. He is stable for DC today. These  studies can be done later as outpatient once renal function stabilized. Will need close follow up with TOC visit. Once cardiac work up complete will consult CT surgery.  Signed,  Peter Martinique, [place] 04/28/2016 10:31 AM

## 2016-04-28 NOTE — Care Management Note (Addendum)
Case Management Note  Patient Details  Name: Aaron Bennett MRN: [id number] Date of Birth: 09-03-1950  Subjective/Objective:                    Action/Plan: Pt discharging home with orders for [facility] services. CM met with the patient and provided him a list of [facility] agencies in the [place] area. He selected [facility]. Butch Penny with [facility] notified and accepted the referral. Pt has moved to [facility] and is living with his sister at: [place]. This information along with sisters phone number: [phone] given to Butch Penny. Bedside RN updated.    Expected Discharge Date:  04/28/16               Expected Discharge Plan:  Home/Self Care  In-House Referral:     Discharge planning Services     Post Acute Care Choice:    Choice offered to:     DME Arranged:    DME Agency:     HH Arranged:    HH Agency:     Status of Service:  Completed, signed off  If discussed at [employer] of Stay Meetings, dates discussed:    Additional Comments:  KATLIN CISZEWSKI, RN 04/28/2016, 12:52 PM

## 2016-04-28 NOTE — Discharge Summary (Signed)
Discharge Summary    Patient ID: Aaron Bennett,  MRN: [id number], DOB/AGE: Mar 24, 1951 65 y.o.  Admit date: 04/26/2016 Discharge date: 04/28/2016  Primary Care Provider: No primary care provider on file. Primary Cardiologist: New - Dr. Martinique  Discharge Diagnoses    Principal Problem:   Acute diastolic CHF (congestive heart failure) ([place]) Active Problems:   Severe mitral insufficiency   Tricuspid valve insufficiency   Anemia of chronic disease   CKD (chronic kidney disease) stage 3, GFR 30-59 ml/min   History of Present Illness     Aaron Bennett is a 65 y.o. male with past medical history of HTN, HLD, prostate cancer, stage 3 CKD, and history of heavy alcohol abuse who presented to [facility] ED on 04/26/2016 for worsening dyspnea.   Reported being hospitalized for CHF exacerbation form 8/20-8/23 at [facility] for a CHF exacerbation. 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 He was not prescribed diuretics at time of discharge due to his elevated creatinine.   He was initially seen by his PCP for his worsening dyspnea since hospital discharge but this continued to worsen and labs performed by his PCP showed a BNP > 4000, therefore he was encouraged to come to the ED.   Upon arrival, his initial BNP was elevated to 2761. Two initial troponin values were negative. Creatinine was 1.6 at that time. He was examined and admitted for further management of his CHF exacerbation.   Hospital Course     Consultants: None  He was started on IV Lasix 40mg  BID and reported significant improvement in his respiratory status the following morning. Weight was noted to be down 10 lbs since admission. However, his creatinine bumped from 1.66 to 2.06 and he was switched to PO Lasix 40mg  daily. He was continued on Coreg 3.125mg  BID but Spironolactone was stopped due to his elevated creatinine and he was  placed on a combination of Hydralazine and Nitrates.   On 04/28/2016, review of records showed he was continuing to diurese well with PO Lasix. Overall, he was -2.8L this admission and weight was down 11 lbs (152 lbs --> 141 lbs). With the switch to PO Lasix, his creatinine was trending down to 1.77 at time of discharge. A repeat echocardiogram showed an EF of 50-55% with severe MR, moderate TR, and moderate AR. With his EF of 50%,  ICD or LifeVest was not indicated at this time. He did have 8 to 17 beats of NSVT and will continue on BB therapy.   He was last examined by Dr. Martinique and deemed stable for discharge. The importance of fluid restriction, sodium restriction, and daily weights was reviewed with the patient and his sister. A 7-day TOC appointment has been arranged to assess his volume status and begin the process of possible MV repair which will consist of at least a TEE and R/LHC. He was discharged home in stable condition. _____________  Discharge Vitals Blood pressure 102/78, pulse 92, temperature 98.2 F (36.8 C), temperature source Oral, resp. rate 16, height 5\' 11"  (1.803 m), weight 141 lb 1.6 oz (64 kg), SpO2 95 %.  Filed Weights   04/26/16 1748 04/27/16 0556 04/28/16 0624  Weight: 146 lb 1.6 oz (66.3 kg) 142 lb 9.6 oz (64.7 kg) 141 lb 1.6 oz (64 kg)    Labs & Radiologic Studies     CBC  Recent Labs  04/26/16 1129  WBC 4.9  NEUTROABS 3.0  HGB 10.8*  HCT 33.3*  MCV 105.4*  PLT 99991111   Basic Metabolic Panel  Recent Labs  04/27/16 0431 04/28/16 0434  NA 134* 134*  K 5.1 4.9  CL 97* 99*  CO2 28 25  GLUCOSE 100* 97  BUN 42* 36*  CREATININE 2.06* 1.77*  CALCIUM 9.0 8.7*   Liver Function Tests  Recent Labs  04/26/16 1129  AST 33  ALT 20  ALKPHOS 56  BILITOT 1.0  PROT 6.9  ALBUMIN 2.8*   No results for input(s): LIPASE, AMYLASE in the last 72 hours. Cardiac Enzymes No results for input(s): CKTOTAL, CKMB, CKMBINDEX, TROPONINI in the last 72  hours. BNP Invalid input(s): POCBNP D-Dimer No results for input(s): DDIMER in the last 72 hours. Hemoglobin A1C No results for input(s): HGBA1C in the last 72 hours. Fasting Lipid Panel No results for input(s): CHOL, HDL, LDLCALC, TRIG, CHOLHDL, LDLDIRECT in the last 72 hours. Thyroid Function Tests No results for input(s): TSH, T4TOTAL, T3FREE, THYROIDAB in the last 72 hours.  Invalid input(s): FREET3  Dg Chest 2 View  Result Date: 04/26/2016 CLINICAL DATA:  Cough for a week. EXAM: CHEST  2 VIEW COMPARISON:  None. FINDINGS: Cardiac silhouette is mild-to-moderately enlarged. No mediastinal or hilar masses or evidence of adenopathy. There are thickened interstitial markings bilaterally. No focal lung consolidation. No pleural effusion or pneumothorax. Skeletal structures are intact. IMPRESSION: 1. Findings consistent with congestive heart failure with interstitial pulmonary edema. No evidence of pneumonia. Electronically Signed   By: Lajean Manes M.D.   On: 04/26/2016 11:46     Diagnostic Studies/Procedures     Echocardiogram: 04/27/2016 Study Conclusions  - Left ventricle: The cavity size was normal. There was moderate   focal basal and moderate concentric hypertrophy of the left   ventricle. Systolic function was normal. The estimated ejection   fraction was in the range of 50% to 55%. Wall motion was normal;   there were no regional wall motion abnormalities. - Ventricular septum: The contour showed diastolic flattening and   systolic flattening. - Aortic valve: There was moderate regurgitation. - Mitral valve: There was severe regurgitation directed centrally   and posteriorly. Valve area by continuity equation (using LVOT   flow): 1.11 cm^2. - Left atrium: The atrium was moderately dilated. - Right ventricle: The cavity size was moderately dilated. Wall   thickness was normal. - Right atrium: The atrium was severely dilated. - Tricuspid valve: There was moderate  regurgitation. - Pulmonary arteries: Systolic pressure was severely increased. PA   peak pressure: 71 mm Hg (S).  Impressions:  - Low normal LVEF, however with severe MR most probably   overestimated.   Mitral leaflets are thickened and mildly calcified. There is   severe mitral regurgitation (RV = 20ml, ERO 50 mm2).   Moderate aortic regurgitation.   Moderately dilated RV with normal RVEF. Severe pulmonary   hypertension.     Disposition   Pt is being discharged home today in good condition.  Follow-up Plans & Appointments    Follow-up Information    Kerin Ransom, [place] Follow up on 05/05/2016.   Specialties:  Cardiology, Radiology Why:  [place] on 05/05/2016 at 11:00AM.  Contact information: [place] [id number] [phone]          Discharge Instructions    Diet - low sodium heart healthy    Complete by:  As directed   Increase activity slowly    Complete by:  As directed      Discharge Medications     Medication List    STOP taking these medications   spironolactone 25 MG tablet Commonly known as:  ALDACTONE     TAKE these medications   aspirin EC 81 MG tablet Take 81 mg by mouth daily.   atorvastatin 40 MG tablet Commonly known as:  LIPITOR Take 40 mg by mouth daily.   carvedilol 3.125 MG tablet Commonly known as:  COREG Take 3.125 mg by mouth 2 (two) times daily with a meal.   folic acid 1 MG tablet Commonly known as:  FOLVITE Take 1 mg by mouth daily.   furosemide 40 MG tablet Commonly known as:  LASIX Take 1 tablet (40 mg total) by mouth daily. What changed:  medication strength  how much to take   hydrALAZINE 10 MG tablet Commonly known as:  APRESOLINE Take 1 tablet (10 mg total) by mouth every 8 (eight) hours.   isosorbide mononitrate 30 MG 24 hr tablet Commonly known as:  IMDUR Take 30 mg by mouth daily.   multivitamin tablet Take 1 tablet by mouth daily.   potassium  chloride SA 20 MEQ tablet Commonly known as:  K-DUR,KLOR-CON Take 20 mEq by mouth 2 (two) times daily.   thiamine 100 MG tablet Commonly known as:  VITAMIN B-1 Take 100 mg by mouth daily.       Allergies No Known Allergies   Outstanding Labs/Studies   BMET at follow-up appointment.  Duration of Discharge Encounter   Greater than 30 minutes including physician time.  Signed, Erma Heritage, PA-C 04/28/2016, 9:54 PM

## 2016-04-28 NOTE — Progress Notes (Signed)
1600 discharge instructions given to pt and sister. Both verbalized understanding. Wheeled to lobby by NT

## 2016-05-02 NOTE — Telephone Encounter (Signed)
Invalid number

## 2016-05-02 NOTE — Telephone Encounter (Signed)
Called listed phone contact. Goes to busy signal/invalid number. Attempted x2.

## 2016-05-05 ENCOUNTER — Ambulatory Visit (INDEPENDENT_AMBULATORY_CARE_PROVIDER_SITE_OTHER): Payer: Self-pay | Admitting: Cardiology

## 2016-05-05 ENCOUNTER — Encounter: Payer: Self-pay | Admitting: Cardiology

## 2016-05-05 VITALS — BP 108/83 | HR 116 | Ht 71.0 in | Wt 151.4 lb

## 2016-05-05 DIAGNOSIS — I5031 Acute diastolic (congestive) heart failure: Secondary | ICD-10-CM

## 2016-05-05 DIAGNOSIS — F1011 Alcohol abuse, in remission: Secondary | ICD-10-CM

## 2016-05-05 DIAGNOSIS — F1010 Alcohol abuse, uncomplicated: Secondary | ICD-10-CM

## 2016-05-05 DIAGNOSIS — N183 Chronic kidney disease, stage 3 unspecified: Secondary | ICD-10-CM

## 2016-05-05 DIAGNOSIS — I4510 Unspecified right bundle-branch block: Secondary | ICD-10-CM

## 2016-05-05 DIAGNOSIS — I340 Nonrheumatic mitral (valve) insufficiency: Secondary | ICD-10-CM

## 2016-05-05 DIAGNOSIS — F17200 Nicotine dependence, unspecified, uncomplicated: Secondary | ICD-10-CM

## 2016-05-05 DIAGNOSIS — Z720 Tobacco use: Secondary | ICD-10-CM

## 2016-05-05 DIAGNOSIS — N289 Disorder of kidney and ureter, unspecified: Secondary | ICD-10-CM

## 2016-05-05 DIAGNOSIS — D62 Acute posthemorrhagic anemia: Secondary | ICD-10-CM

## 2016-05-05 LAB — CBC WITH DIFFERENTIAL/PLATELET
Basophils Absolute: 55 cells/uL (ref 0–200)
Basophils Relative: 1 %
Eosinophils Absolute: 110 cells/uL (ref 15–500)
Eosinophils Relative: 2 %
HCT: 37.7 % — ABNORMAL LOW (ref 38.5–50.0)
Hemoglobin: 12.5 g/dL — ABNORMAL LOW (ref 13.2–17.1)
Lymphocytes Relative: 41 %
Lymphs Abs: 2255 cells/uL (ref 850–3900)
MCH: 34.4 pg — ABNORMAL HIGH (ref 27.0–33.0)
MCHC: 33.2 g/dL (ref 32.0–36.0)
MCV: 103.9 fL — ABNORMAL HIGH (ref 80.0–100.0)
MPV: 9.4 fL (ref 7.5–12.5)
Monocytes Absolute: 660 cells/uL (ref 200–950)
Monocytes Relative: 12 %
Neutro Abs: 2420 cells/uL (ref 1500–7800)
Neutrophils Relative %: 44 %
Platelets: 216 10*3/uL (ref 140–400)
RBC: 3.63 MIL/uL — ABNORMAL LOW (ref 4.20–5.80)
RDW: 13.7 % (ref 11.0–15.0)
WBC: 5.5 10*3/uL (ref 3.8–10.8)

## 2016-05-05 LAB — BASIC METABOLIC PANEL
BUN: 27 mg/dL — ABNORMAL HIGH (ref 7–25)
CO2: 25 mmol/L (ref 20–31)
Calcium: 9.2 mg/dL (ref 8.6–10.3)
Chloride: 101 mmol/L (ref 98–110)
Creat: 1.58 mg/dL — ABNORMAL HIGH (ref 0.70–1.25)
Glucose, Bld: 68 mg/dL (ref 65–99)
Potassium: 5.4 mmol/L — ABNORMAL HIGH (ref 3.5–5.3)
Sodium: 136 mmol/L (ref 135–146)

## 2016-05-05 MED ORDER — CARVEDILOL 6.25 MG PO TABS
6.2500 mg | ORAL_TABLET | Freq: Two times a day (BID) | ORAL | 6 refills | Status: DC
Start: 2016-05-05 — End: 2016-11-29

## 2016-05-05 NOTE — Patient Instructions (Signed)
Medication Instructions:  Your physician has recommended you make the following change in your medication:  1. Increase Coreg (6.25 mg ) twice a day   Labwork: Your physician recommends that you have lab work today: CBC/BMP   Testing/Procedures: -None  Follow-Up: To Be Determined  Any Other Special Instructions Will Be Listed Below (If Applicable).     If you need a refill on your cardiac medications before your next appointment, please call your pharmacy.

## 2016-05-05 NOTE — Assessment & Plan Note (Signed)
Quit 04/16/16

## 2016-05-05 NOTE — Assessment & Plan Note (Signed)
He will need TEE and Rt and Lt cath and evaluation for possible MV repair

## 2016-05-05 NOTE — Assessment & Plan Note (Signed)
Discharged from [facility] 04/28/16- EF 50-55% by echo 04/27/16 EF was 30-35% by echo 04/16/16 in [place]

## 2016-05-05 NOTE — Assessment & Plan Note (Signed)
Pt had ETOH withdrawal with seizures Aug 2017 during [place] admission for CHF

## 2016-05-05 NOTE — Assessment & Plan Note (Signed)
SCr 1.77 on 9/1

## 2016-05-05 NOTE — Progress Notes (Signed)
05/05/2016 Loni Dolly   July 23, 1951  [id number]  Primary Physician Pcp Not In System Primary Cardiologist: Dr Martinique  HPI:  65 y.o.AA male with past medical history of HTN, HLD, prostate cancer, stage 3 CKD, and history of heavy alcohol abuse who was hospitalized for CHF exacerbation from 8/20-8/23/ 17 at [facility]. Echo that admission showed reduced EF of 30-35% with severe MR and TR. 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 He did have alcohol withdrawal with seizures.during that admission. He was not prescribed diuretics at time of discharge due to an elevated creatinine. He went to stay with his nephew in [place] after discharge. The pt became SOB and came the ED at [facility] 04/26/16 with CHF. He was admitted and diuresed. A repeat echo done 04/27/16 showed improved LVF-50-55% with severe MR.   He is in the office today for follow up. Since discharge he has been doing well. He is staying with his nephew and his nephew accompanied him today to the office visit. He confirms that Mr Iwanski has not been drinking or smoking since discharge. The pt says he feels well, no unusual dyspnea or edema. His nephew tells me that Mr Grossheim will be staying with him for the immediate future- including post op rehab if needed.    Current Outpatient Prescriptions  Medication Sig Dispense Refill  . aspirin EC 81 MG tablet Take 81 mg by mouth daily.    Marland Kitchen atorvastatin (LIPITOR) 40 MG tablet Take 40 mg by mouth daily.    . carvedilol (COREG) 3.125 MG tablet Take 6.25 mg by mouth 2 (two) times daily with a meal.    . folic acid (FOLVITE) 1 MG tablet Take 1 mg by mouth daily.    . furosemide (LASIX) 40 MG tablet Take 1 tablet (40 mg total) by mouth daily. 30 tablet 6  . hydrALAZINE (APRESOLINE) 10 MG tablet Take 1 tablet (10 mg total) by mouth every 8 (eight) hours. 90 tablet 6  . isosorbide mononitrate (IMDUR) 30 MG 24 hr tablet Take 30 mg by mouth daily.    . Multiple Vitamin  (MULTIVITAMIN) tablet Take 1 tablet by mouth daily.    . potassium chloride SA (K-DUR,KLOR-CON) 20 MEQ tablet Take 20 mEq by mouth 2 (two) times daily.    Marland Kitchen thiamine (VITAMIN B-1) 100 MG tablet Take 100 mg by mouth daily.     No current facility-administered medications for this visit.     No Known Allergies  Social History   Social History  . Marital status: Single    Spouse name: N/A  . Number of children: N/A  . Years of education: N/A   Occupational History  . Not on file.   Social History Main Topics  . Smoking status: Former Smoker    Packs/day: 1.00    Years: 50.00    Quit date: 04/16/2016  . Smokeless tobacco: Never Used  . Alcohol use Yes     Comment: 1/2 pine  . Drug use: Unknown  . Sexual activity: Not on file   Other Topics Concern  . Not on file   Social History Narrative  . No narrative on file     Review of Systems: General: negative for chills, fever, night sweats or weight changes.  Cardiovascular: negative for chest pain, dyspnea on exertion, edema, orthopnea, palpitations, paroxysmal nocturnal dyspnea or shortness of breath Dermatological: negative for rash Respiratory: negative for cough or wheezing Urologic: negative for hematuria Abdominal: negative for nausea, vomiting, diarrhea,  bright red blood per rectum, melena, or hematemesis Neurologic: negative for visual changes, syncope, or dizziness All other systems reviewed and are otherwise negative except as noted above.    Blood pressure 108/83, pulse (!) 116, height 5\' 11"  (1.803 m), weight 151 lb 6.4 oz (68.7 kg), SpO2 98 %.  General appearance: alert, cooperative, cachectic and no distress Neck: no carotid bruit and no JVD Lungs: clear to auscultation bilaterally Heart: regular rate and rhythm and 2/6 holosystolic murmur MV area Abdomen: soft, non-tender; bowel sounds normal; no masses,  no organomegaly and liver down 2-3 fingers below RCM Extremities: no edema Skin: Skin color, texture,  turgor normal. No rashes or lesions Neurologic: Grossly normal   ASSESSMENT AND PLAN:   Acute diastolic CHF (congestive heart failure) ([place]) Discharged from [facility] 04/28/16- EF 50-55% by echo 04/27/16 EF was 30-35% by echo 04/16/16 in [place]  Severe mitral insufficiency He will need TEE and Rt and Lt cath and evaluation for possible MV repair  CKD (chronic kidney disease) stage 3, GFR 30-59 ml/min SCr 1.77 on 9/1  H/O ETOH abuse Pt had ETOH withdrawal with seizures Aug 2017 during [place] admission for CHF  RBBB .  Smoker Quit 04/16/16   PLAN  Check CBC and BMP today. I increased his Coreg to 6.25 mg BID (resting HR 110).   I will discuss with Dr Martinique timing of further work up- ? TEE, [place] and [place] cath.   Kerin Ransom PA-C 05/05/2016 11:54 AM

## 2016-05-06 ENCOUNTER — Telehealth: Payer: Self-pay | Admitting: Cardiology

## 2016-05-06 ENCOUNTER — Other Ambulatory Visit: Payer: Self-pay | Admitting: Cardiology

## 2016-05-06 NOTE — Telephone Encounter (Signed)
I spoke with pt's nephew Delfino Lovett who is caring for him. Dr Martinique feels we can proceed with a TEE and Rt and Lt heart cath. I also reviewed yesterday's lab and stopped the pt's K+ supplement.   Catlin Doria PA-C 05/06/2016 1:12 PM

## 2016-05-12 ENCOUNTER — Other Ambulatory Visit: Payer: Self-pay | Admitting: *Deleted

## 2016-05-12 ENCOUNTER — Encounter: Payer: Self-pay | Admitting: *Deleted

## 2016-05-12 ENCOUNTER — Telehealth: Payer: Self-pay | Admitting: *Deleted

## 2016-05-12 DIAGNOSIS — E875 Hyperkalemia: Secondary | ICD-10-CM

## 2016-05-12 NOTE — Telephone Encounter (Signed)
-----   Message from Erlene Quan, [place] sent at 05/09/2016 11:03 AM EDT ----- Stop K+ supplement, re check BMP in one week  LUKE KILROY PA-C 05/09/2016 11:03 AM

## 2016-05-12 NOTE — Telephone Encounter (Signed)
Spoke with pt, he reports he was called by [facility] and stopped the potassium. They were not aware he needed repeat lab work. Lab orders mailed to the pt for recheck.

## 2016-05-15 ENCOUNTER — Telehealth: Payer: Self-pay | Admitting: *Deleted

## 2016-05-15 DIAGNOSIS — I340 Nonrheumatic mitral (valve) insufficiency: Secondary | ICD-10-CM

## 2016-05-15 DIAGNOSIS — I5031 Acute diastolic (congestive) heart failure: Secondary | ICD-10-CM

## 2016-05-15 DIAGNOSIS — I071 Rheumatic tricuspid insufficiency: Secondary | ICD-10-CM

## 2016-05-15 DIAGNOSIS — I4510 Unspecified right bundle-branch block: Secondary | ICD-10-CM

## 2016-05-15 NOTE — Telephone Encounter (Signed)
Left message for pt to call.

## 2016-05-15 NOTE — Telephone Encounter (Signed)
-----   Message from Erlene Quan, [place] sent at 05/06/2016  1:06 PM EDT ----- This pt needs to have an OP TEE and Rt and Lt heart cath arranged for MV disease, pt of Dr Martinique.  Kerin Ransom PA-C 05/06/2016 1:07 PM

## 2016-05-16 NOTE — Telephone Encounter (Signed)
Left msg for Megan to call back to discuss concerns/asessment findings. Also notified in VM msg of pending tests per Dr. Martinique.

## 2016-05-16 NOTE — Telephone Encounter (Signed)
°  Follow Up   Nurse calling back as requested. Please call.

## 2016-05-16 NOTE — Telephone Encounter (Signed)
°  New Prob    Blood pressure trending down for the last week and weight has been trending up. Nurse states he has gained 4.6 pounds in last 7 days. Please call.

## 2016-05-17 NOTE — Telephone Encounter (Signed)
Patient called not at home.[place].

## 2016-05-17 NOTE — Telephone Encounter (Signed)
I would not reduce medication. BP is OK for him.   Dudley Cooley Martinique MD, [facility]

## 2016-05-17 NOTE — Telephone Encounter (Signed)
Have left a follow up request for Megan to return call. Note advisements from [facility] regarding setting pt up for TEE and cath - will cc to [facility] to review

## 2016-05-17 NOTE — Telephone Encounter (Signed)
Returned call to [facility] with [facility] left message on personal voice mail Dr.Jordan advised to continue same medications,B/P is ok for him.

## 2016-05-17 NOTE — Telephone Encounter (Signed)
Returned call to [place] with [place].She stated she saw patient for the first time yesterday.She wanted Dr.Jordan to know patient has gained 4.8 lbs within 1 week.B/P low ranging 100/76.1+ edema lf leg,2+ in rt leg.Stated pt voiced no complaints.No sob.She stated she reviewed low sodium diet and he is restricting fluid intake to 2 L daily.Stated she wanted to ask Dr.Jordan if he needed to decrease medications due to low B/P.I will be scheduling outpt TEE and rt and lf cardiac cath.Message sent to Juda for advice.

## 2016-05-18 ENCOUNTER — Other Ambulatory Visit: Payer: Self-pay | Admitting: Cardiology

## 2016-05-18 DIAGNOSIS — I340 Nonrheumatic mitral (valve) insufficiency: Secondary | ICD-10-CM

## 2016-05-18 LAB — CBC WITH DIFFERENTIAL/PLATELET
BASOS PCT: 0 %
Basophils Absolute: 0 cells/uL (ref 0–200)
EOS ABS: 177 {cells}/uL (ref 15–500)
Eosinophils Relative: 3 %
HEMATOCRIT: 33.4 % — AB (ref 38.5–50.0)
HEMOGLOBIN: 11 g/dL — AB (ref 13.2–17.1)
LYMPHS PCT: 41 %
Lymphs Abs: 2419 cells/uL (ref 850–3900)
MCH: 34 pg — ABNORMAL HIGH (ref 27.0–33.0)
MCHC: 32.9 g/dL (ref 32.0–36.0)
MCV: 103.1 fL — AB (ref 80.0–100.0)
MPV: 10.2 fL (ref 7.5–12.5)
Monocytes Absolute: 590 cells/uL (ref 200–950)
Monocytes Relative: 10 %
Neutro Abs: 2714 cells/uL (ref 1500–7800)
Neutrophils Relative %: 46 %
Platelets: 199 10*3/uL (ref 140–400)
RBC: 3.24 MIL/uL — AB (ref 4.20–5.80)
RDW: 13.9 % (ref 11.0–15.0)
WBC: 5.9 10*3/uL (ref 3.8–10.8)

## 2016-05-18 NOTE — Telephone Encounter (Signed)
Spoke to patient.Dr.Jordan advised to continue same medications. TEE scheduled 05/24/16 at 9:00 am arrive at [facility] hospital at 7:30 am. Right and left cardiac cath scheduled 05/24/16 at 11:00 am.Patient to pick up instructions and have pre procedure labs tomorrow Fri 05/19/16.

## 2016-05-19 LAB — PROTIME-INR
INR: 1.1
Prothrombin Time: 11.4 s (ref 9.0–11.5)

## 2016-05-19 LAB — BASIC METABOLIC PANEL
BUN: 26 mg/dL — ABNORMAL HIGH (ref 7–25)
CALCIUM: 9 mg/dL (ref 8.6–10.3)
CHLORIDE: 105 mmol/L (ref 98–110)
CO2: 23 mmol/L (ref 20–31)
Creat: 1.64 mg/dL — ABNORMAL HIGH (ref 0.70–1.25)
GLUCOSE: 48 mg/dL — AB (ref 65–99)
Potassium: 4.2 mmol/L (ref 3.5–5.3)
SODIUM: 139 mmol/L (ref 135–146)

## 2016-05-22 ENCOUNTER — Telehealth: Payer: Self-pay | Admitting: Cardiovascular Disease

## 2016-05-22 NOTE — Telephone Encounter (Signed)
error 

## 2016-05-23 ENCOUNTER — Telehealth: Payer: Self-pay

## 2016-05-23 NOTE — Telephone Encounter (Signed)
Kerin Ransom PA-C spoke with Aaron Bennett from the precert dept and she stated that patient needed to call insurance company and find out who is in network for care. Spoke with patient nephew, Aaron Bennett, and relayed info about coverage and about cancellation of TEE and cath tomorrow, as well as once the insurance company gives them a provider that we will refer and or send any necessary notes to that office. Nephew voiced understanding.

## 2016-05-23 NOTE — Telephone Encounter (Signed)
Received a Skype from Dorian Pod in the precert department for this patient, she stated the procedure that is scheduled for tomorrow TEE, Left & Right cath, is not covered by insurance and that none of the office visits will be covered because we are not in contract with his insurance.

## 2016-05-24 ENCOUNTER — Ambulatory Visit (HOSPITAL_COMMUNITY)
Admission: RE | Admit: 2016-05-24 | Payer: Medicare (Managed Care) | Source: Ambulatory Visit | Admitting: Cardiovascular Disease

## 2016-05-24 ENCOUNTER — Encounter (HOSPITAL_COMMUNITY): Admission: RE | Payer: Self-pay | Source: Ambulatory Visit

## 2016-05-24 SURGERY — RIGHT/LEFT HEART CATH AND CORONARY ANGIOGRAPHY

## 2016-05-24 SURGERY — ECHOCARDIOGRAM, TRANSESOPHAGEAL
Anesthesia: Moderate Sedation

## 2016-06-08 ENCOUNTER — Telehealth: Payer: Self-pay | Admitting: Cardiology

## 2016-06-08 NOTE — Telephone Encounter (Signed)
New Message  Pt son call requesting to speak with RN. Pt son call to reschedule pts TEE. Please call back to discuss

## 2016-06-08 NOTE — Telephone Encounter (Signed)
Spoke with Nephew-Richard he states that pt insurance had to be changed and he changed it and now needs to reschedule TEE please call Nephew on cell number [phone])

## 2016-06-08 NOTE — Telephone Encounter (Signed)
Returned call to patient's nephew Delfino Lovett.He stated uncle has changed insurances and he is ready to schedule TEE and cath.Advised I will speak to Dr.Jordan on Mon 06/12/16 and call you back.

## 2016-06-12 NOTE — Telephone Encounter (Signed)
Returned call to patient's nephew Delfino Lovett.Spoke to [place] he advised pt will need office visit before we schedule TEE and rt & lf cardiac cath,will need a updated H&P.Advised keep appointment as scheduled with Almyra Deforest PA 06/14/16 at 9:30 am.

## 2016-06-13 NOTE — Progress Notes (Signed)
Cardiology Office Note    Date:  06/14/2016   ID:  Aaron Bennett, DOB 12-12-1950, MRN [id number]  PCP:  Pcp Not In System  Cardiologist:  Dr. Martinique  Chief Complaint  Patient presents with  . Follow-up    seen for Dr. Martinique.     History of Present Illness:  Aaron Bennett is a 65 y.o. male with PMH of HTN, HLD, prostate CA, stage III CKD, and h/o alcohol abuse. He was hospitalized for CHF exacerbation from 8/20-8/23/2017 at [facility]. Echocardiogram obtained during that admission showed reduced ejection fraction of 30-35% with severe MR and TR. 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 He did have alcohol withdrawal with seizure during that admission. He was not prescribed diuretic at the time of discharge due to elevated creatinine. He went to stay with his nephew in [place] area after discharge. Patient became short of breath and came to the emergency room at [facility] on 04/26/2016 with congestive heart failure. He was admitted and diuresed. A repeat echo obtained on 04/27/2016 showed improved ejection fraction of 50-55% with severe MR.  He was last seen in the office by Kerin Ransom PA-C on 05/05/2016, he was abstinent from alcohol use or smoking since discharge. During the office visit, it was discussed with the patient regarding transesophageal echocardiogram and a left and right heart cath for evaluation of possible mitral valve replacement surgery, however he did not have insurance at the time, therefore the scheduled procedure was canceled. His carvedilol did increase to 6.25 mg twice a day. He initially had some insurance issues, therefore did not proceed with previously scheduled TEE and left and right heart cath. However has since transitioned to [facility] and Medicaid. His nephew is still working with Set designer and likely will settle his insurance issue in the next few days. He would like to proceed with further  workup.    Past Medical History:  Diagnosis Date  . CHF (congestive heart failure) ([place])    a. 04/2016: echo with EF of 50-55%, previously reported 30-35% by outside records.  . CKD (chronic kidney disease) stage 3, GFR 30-59 ml/min   . H/O ETOH abuse   . Hypertension   . Mitral regurgitation    a. severe by echo in 04/2016  . Prostate cancer [facility])     Past Surgical History:  Procedure Laterality Date  . APPENDECTOMY    . PROSTATE SURGERY      Current Medications: Outpatient Medications Prior to Visit  Medication Sig Dispense Refill  . atorvastatin (LIPITOR) 40 MG tablet Take 40 mg by mouth daily.    . carvedilol (COREG) 6.25 MG tablet Take 1 tablet (6.25 mg total) by mouth 2 (two) times daily with a meal. 60 tablet 6  . furosemide (LASIX) 40 MG tablet Take 1 tablet (40 mg total) by mouth daily. 30 tablet 6  . hydrALAZINE (APRESOLINE) 10 MG tablet Take 1 tablet (10 mg total) by mouth every 8 (eight) hours. 90 tablet 6  . Multiple Vitamin (MULTIVITAMIN) tablet Take 1 tablet by mouth daily.    Marland Kitchen aspirin EC 81 MG tablet Take 81 mg by mouth daily.    . folic acid (FOLVITE) 1 MG tablet Take 1 mg by mouth daily.    . isosorbide mononitrate (IMDUR) 30 MG 24 hr tablet Take 30 mg by mouth daily.    Marland Kitchen thiamine (VITAMIN B-1) 100 MG tablet Take 100 mg by mouth daily.     No facility-administered  medications prior to visit.      Allergies:   Review of patient's allergies indicates no known allergies.   Social History   Social History  . Marital status: Single    Spouse name: N/A  . Number of children: N/A  . Years of education: N/A   Social History Main Topics  . Smoking status: Former Smoker    Packs/day: 1.00    Years: 50.00    Quit date: 04/16/2016  . Smokeless tobacco: Never Used  . Alcohol use Yes     Comment: 1/2 pine  . Drug use: Unknown  . Sexual activity: Not Asked   Other Topics Concern  . None   Social History Narrative  . None     Family History:  The  patient's family history includes Lung disease in his mother; Stroke (age of onset: 20) in his father; Yves Dill Parkinson White syndrome in his brother.   ROS:   Please see the history of present illness.    ROS All other systems reviewed and are negative.   PHYSICAL EXAM:   VS:  BP 115/82   Pulse 89   Ht 5\' 11"  (1.803 m)   Wt 156 lb 6.4 oz (70.9 kg)   BMI 21.81 kg/m    GEN: Well nourished, well developed, in no acute distress  HEENT: normal  Neck: no JVD, carotid bruits, or masses Cardiac: RRR; no rubs, or gallops,no edema  4/6 systolic murmur at RUSB Respiratory:  clear to auscultation bilaterally, normal work of breathing GI: soft, nontender, nondistended, + BS MS: no deformity or atrophy  Skin: warm and dry, no rash Neuro:  Alert and Oriented x 3, Strength and sensation are intact Psych: euthymic mood, full affect  Wt Readings from Last 3 Encounters:  06/14/16 156 lb 6.4 oz (70.9 kg)  05/05/16 151 lb 6.4 oz (68.7 kg)  04/28/16 141 lb 1.6 oz (64 kg)      Studies/Labs Reviewed:   EKG:  EKG is not ordered today.    Recent Labs: 04/26/2016: ALT 20; B Natriuretic Peptide 2,761.6 05/18/2016: BUN 26; Creat 1.64; Hemoglobin 11.0; Platelets 199; Potassium 4.2; Sodium 139   Lipid Panel No results found for: CHOL, TRIG, HDL, CHOLHDL, VLDL, LDLCALC, LDLDIRECT  Additional studies/ records that were reviewed today include:   Echo 04/27/2016 LV EF: 50% -   55%  ------------------------------------------------------------------- Indications:      CHF - 428.0.  ------------------------------------------------------------------- History:   Risk factors:  History of alcohol abuse. Hypertension.   ------------------------------------------------------------------- Study Conclusions  - Left ventricle: The cavity size was normal. There was moderate   focal basal and moderate concentric hypertrophy of the left   ventricle. Systolic function was normal. The estimated ejection    fraction was in the range of 50% to 55%. Wall motion was normal;   there were no regional wall motion abnormalities. - Ventricular septum: The contour showed diastolic flattening and   systolic flattening. - Aortic valve: There was moderate regurgitation. - Mitral valve: There was severe regurgitation directed centrally   and posteriorly. Valve area by continuity equation (using LVOT   flow): 1.11 cm^2. - Left atrium: The atrium was moderately dilated. - Right ventricle: The cavity size was moderately dilated. Wall   thickness was normal. - Right atrium: The atrium was severely dilated. - Tricuspid valve: There was moderate regurgitation. - Pulmonary arteries: Systolic pressure was severely increased. PA   peak pressure: 71 mm Hg (S).  Impressions:  - Low normal LVEF, however with severe  MR most probably   overestimated.   Mitral leaflets are thickened and mildly calcified. There is   severe mitral regurgitation (RV = 59ml, ERO 50 mm2).   Moderate aortic regurgitation.   Moderately dilated RV with normal RVEF. Severe pulmonary   hypertension.   ASSESSMENT:    1. Severe mitral regurgitation by prior echocardiogram   2. CKD (chronic kidney disease), stage III   3. Essential hypertension   4. Hyperlipidemia, unspecified hyperlipidemia type      PLAN:  In order of problems listed above:   1. Severe MR: Despite a previous echocardiogram showed moderate LV dysfunction, repeat echocardiogram at [facility] showed ejection fraction is actually normal. He is currently compensated on Lasix, there is no sign of fluid overload on physical exam. The loud systolic murmur is quite impressive on physical exam. Overall he is doing okay. His nephew still sorting out his Set designer, and currently trying to transfer while this Medicare from out of state to [place]. We did discuss benefit and risk of transesophageal echocardiogram including 1:10000 risks associated with sedation  (hypertension, bradycardia, aspiration event) and risk of traumatic injury with transesophageal probe. We further discussed benefit and risk of cardiac catheterization including but not limited to Risk and benefit of procedure explained to the patient who display clear understanding and agree to proceed to bleeding, vascular injury, renal injury, arrythmia, MI, stroke and loss of limb or life. Patient was agreeable to proceed with the procedures. His nephew is in the process of transferring his insurance from out of state.   - Once he is completed with this process, we plan to obtain precath labs and have him proceed with upcoming procedures (TEE and left and right heart cath).  2. CKD stage III: Main concern of upcoming cardiac cath is related to his baseline CKD, his creatinine is around 1.5-1.6 range, have advised him to hold his Lasix at least 24 hours prior to cath. I also emphasized the risk of kidney injury is higher in people with underlying chronic kidney disease and he understand this risk as well.  3. Hypertension: His blood pressure is reasonably controlled  4. HLD: Continue on Lipitor 40 mg daily. We'll need to discuss with patient on the next follow-up who is managing his cholesterol to see if he need a fasting lipid panel. I did not find one in our system.    Medication Adjustments/Labs and Tests Ordered: Current medicines are reviewed at length with the patient today.  Concerns regarding medicines are outlined above.  Medication changes, Labs and Tests ordered today are listed in the Patient Instructions below. Patient Instructions  Give our office a call when you are ready to schedule Heart Cath and TEE    Weston Brass Almyra Deforest, [place]  06/14/2016 7:46 PM    [address], [place], [place]  [address] Phone: [phone]; Fax: [phone]

## 2016-06-14 ENCOUNTER — Encounter: Payer: Self-pay | Admitting: Physician Assistant

## 2016-06-14 ENCOUNTER — Ambulatory Visit (INDEPENDENT_AMBULATORY_CARE_PROVIDER_SITE_OTHER): Payer: Self-pay | Admitting: Physician Assistant

## 2016-06-14 VITALS — BP 115/82 | HR 89 | Ht 71.0 in | Wt 156.4 lb

## 2016-06-14 DIAGNOSIS — N183 Chronic kidney disease, stage 3 unspecified: Secondary | ICD-10-CM

## 2016-06-14 DIAGNOSIS — I340 Nonrheumatic mitral (valve) insufficiency: Secondary | ICD-10-CM

## 2016-06-14 DIAGNOSIS — I10 Essential (primary) hypertension: Secondary | ICD-10-CM

## 2016-06-14 DIAGNOSIS — E785 Hyperlipidemia, unspecified: Secondary | ICD-10-CM

## 2016-06-14 MED ORDER — ASPIRIN EC 81 MG PO TBEC: 81.0000 mg | DELAYED_RELEASE_TABLET | Freq: Every day | ORAL | 11 refills | Status: DC

## 2016-06-14 MED ORDER — FOLIC ACID 1 MG PO TABS: 1.0000 mg | ORAL_TABLET | Freq: Every day | ORAL | 11 refills | Status: DC

## 2016-06-14 MED ORDER — VITAMIN B-1 100 MG PO TABS: 100.0000 mg | ORAL_TABLET | Freq: Every day | ORAL | 11 refills | Status: DC

## 2016-06-14 MED ORDER — ISOSORBIDE MONONITRATE ER 30 MG PO TB24: 30.0000 mg | ORAL_TABLET | Freq: Every day | ORAL | 11 refills | Status: DC

## 2016-06-14 NOTE — Patient Instructions (Signed)
Give our office a call when you are ready to schedule Heart Cath and TEE

## 2016-07-27 ENCOUNTER — Telehealth: Payer: Self-pay | Admitting: Cardiology

## 2016-07-27 NOTE — Telephone Encounter (Signed)
I called, no answer when dialed, goes to VM message w no means to leave voice mail.  Msg sent to scheduling to alert them of request.

## 2016-07-27 NOTE — Telephone Encounter (Signed)
New Message  Pt son call requesting to speak with RN about rescheduling pt Cath procedure. Please call back to discuss

## 2016-07-27 NOTE — Telephone Encounter (Signed)
Reviewed again - patient has been >30 days since seen in office - will need new visit. Will discuss w nephew when he calls.

## 2016-07-31 ENCOUNTER — Encounter: Payer: Self-pay | Admitting: Cardiology

## 2016-08-01 NOTE — Telephone Encounter (Signed)
Left msg for patient to call. 

## 2016-08-30 ENCOUNTER — Encounter: Payer: Self-pay | Admitting: Physician Assistant

## 2016-08-30 ENCOUNTER — Ambulatory Visit (INDEPENDENT_AMBULATORY_CARE_PROVIDER_SITE_OTHER): Payer: Medicaid Other | Admitting: Physician Assistant

## 2016-08-30 VITALS — BP 102/78 | HR 90 | Ht 71.0 in | Wt 158.2 lb

## 2016-08-30 DIAGNOSIS — N183 Chronic kidney disease, stage 3 unspecified: Secondary | ICD-10-CM

## 2016-08-30 DIAGNOSIS — I340 Nonrheumatic mitral (valve) insufficiency: Secondary | ICD-10-CM | POA: Diagnosis not present

## 2016-08-30 DIAGNOSIS — I5032 Chronic diastolic (congestive) heart failure: Secondary | ICD-10-CM | POA: Diagnosis not present

## 2016-08-30 LAB — CBC
HEMATOCRIT: 36.2 % — AB (ref 38.5–50.0)
HEMOGLOBIN: 11.6 g/dL — AB (ref 13.2–17.1)
MCH: 33.1 pg — AB (ref 27.0–33.0)
MCHC: 32 g/dL (ref 32.0–36.0)
MCV: 103.4 fL — AB (ref 80.0–100.0)
MPV: 9.8 fL (ref 7.5–12.5)
Platelets: 235 10*3/uL (ref 140–400)
RBC: 3.5 MIL/uL — ABNORMAL LOW (ref 4.20–5.80)
RDW: 15 % (ref 11.0–15.0)
WBC: 6.5 10*3/uL (ref 3.8–10.8)

## 2016-08-30 LAB — PROTIME-INR
INR: 1.1
Prothrombin Time: 11.2 s (ref 9.0–11.5)

## 2016-08-30 NOTE — Patient Instructions (Signed)
YOU WILL BE HAVING RIGHT AND LEFT HEART CATHETERIZATION ON 09-06-2016 WITH DR Martinique THIS WILL BE AT 1130AM,  ALSO, YOU WILL BE HAVING A TEE PROCEDURE BEFORE THAT PROCEDURE. CHECK IN AT [place] AND PROCEDURE ON 09-06-16. YOU WILL CHECK IN AT THE MAIN ENTRANCE- [place] AT [facility] OFF OF [address] CHECK IN AT ADMITTING.

## 2016-08-30 NOTE — Progress Notes (Signed)
Cardiology Office Note   Date:  08/30/2016   ID:  Aaron Bennett, DOB 12-07-1950, MRN [id number]  PCP:  Pcp Not In System  Cardiologist:  Dr Martinique  Hao Meng 06/08/2016 Rosaria Ferries, PA-C   Chief Complaint  Patient presents with  . Follow-up    Discuss cath and TEE    History of Present Illness: Aaron Bennett is a 66 y.o. male with a history of  HTN, HLD, prostate CA, CKD stage III, and h/o alcohol abuse. EF 30-35% at echo during admit for CHF to [facility] [id number], complicated by ETOH withdrawal and sz.   04/27/2016 admit to [facility] for CHF, EF now 50-55% w/ severe MR.  06/14/2016 pt seen by Almyra Deforest and scheduled for TEE w/ R/L cath to eval valve. Procedure rescheduled, additional office visit needed to make sure pt ok for procedure.  Aaron Bennett presents for cardiology evaluation. His nephew is with him today.  He has been doing fairly well. He denies LE edema, PND or orthopnea. His DOE is at baseline, but he can only walk about 100 feet without stopping.   He never gets palpitations and never feels his heart skipping or racing. He has not had any chest pain. He has not had ETOH. He is compliant with his medications.   Past Medical History:  Diagnosis Date  . CHF (congestive heart failure) ([place])    a. 04/2016: echo with EF of 50-55%, previously reported 30-35% by outside records.  . CKD (chronic kidney disease) stage 3, GFR 30-59 ml/min   . H/O ETOH abuse   . Hypertension   . Mitral regurgitation    a. severe by echo in 04/2016  . Prostate cancer [facility])     Past Surgical History:  Procedure Laterality Date  . APPENDECTOMY    . PROSTATE SURGERY      Current Outpatient Prescriptions  Medication Sig Dispense Refill  . aspirin EC 81 MG tablet Take 1 tablet (81 mg total) by mouth daily. 30 tablet 11  . atorvastatin (LIPITOR) 40 MG tablet Take 40 mg by mouth daily.    . carvedilol (COREG) 6.25 MG tablet Take 1 tablet (6.25 mg total) by mouth 2 (two)  times daily with a meal. 60 tablet 6  . folic acid (FOLVITE) 1 MG tablet Take 1 tablet (1 mg total) by mouth daily. 30 tablet 11  . furosemide (LASIX) 40 MG tablet Take 1 tablet (40 mg total) by mouth daily. 30 tablet 6  . hydrALAZINE (APRESOLINE) 10 MG tablet Take 1 tablet (10 mg total) by mouth every 8 (eight) hours. 90 tablet 6  . isosorbide mononitrate (IMDUR) 30 MG 24 hr tablet Take 1 tablet (30 mg total) by mouth daily. 30 tablet 11  . Multiple Vitamin (MULTIVITAMIN) tablet Take 1 tablet by mouth daily.    Marland Kitchen thiamine (VITAMIN B-1) 100 MG tablet Take 1 tablet (100 mg total) by mouth daily. 30 tablet 11   No current facility-administered medications for this visit.     Allergies:   Patient has no known allergies.    Social History:  The patient  reports that he quit smoking about 4 months ago. He has a 50.00 pack-year smoking history. He has never used smokeless tobacco. He reports that he drinks alcohol.   Family History:  The patient's family history includes Lung disease in his mother; Stroke (age of onset: 61) in his father; Yves Dill Parkinson White syndrome in his brother.    ROS:  Please see the  history of present illness. All other systems are reviewed and negative.    PHYSICAL EXAM: VS:  BP 102/78   Pulse 90   Ht 5\' 11"  (1.803 m)   Wt 158 lb 3.2 oz (71.8 kg)   SpO2 94%   BMI 22.06 kg/m  , BMI Body mass index is 22.06 kg/m. GEN: Well nourished, well developed, male in no acute distress  HEENT: normal for age  Neck: JVD 8 cm, no carotid bruit, no masses Cardiac: RRR; 2-3/6 murmur, no rubs, or gallops Respiratory: decreased BS bases bilaterally, normal work of breathing GI: soft, nontender, nondistended, + BS MS: no deformity or atrophy; no edema; distal pulses are 2+ in all 4 extremities   Skin: warm and dry, no rash Neuro:  Strength and sensation are intact Psych: euthymic mood, full affect   EKG:  EKG is not ordered today. The ECG from 08/30 demonstrates SR, inc  RBBB w/ QRS duration 119 ms  ECHO: 04/27/2016 - Left ventricle: The cavity size was normal. There was moderate   focal basal and moderate concentric hypertrophy of the left   ventricle. Systolic function was normal. The estimated ejection   fraction was in the range of 50% to 55%. Wall motion was normal;   there were no regional wall motion abnormalities. - Ventricular septum: The contour showed diastolic flattening and   systolic flattening. - Aortic valve: There was moderate regurgitation. - Mitral valve: There was severe regurgitation directed centrally   and posteriorly. Valve area by continuity equation (using LVOT   flow): 1.11 cm^2. - Left atrium: The atrium was moderately dilated. - Right ventricle: The cavity size was moderately dilated. Wall   thickness was normal. - Right atrium: The atrium was severely dilated. - Tricuspid valve: There was moderate regurgitation. - Pulmonary arteries: Systolic pressure was severely increased. PA   peak pressure: 71 mm Hg (S). Impressions: - Low normal LVEF, however with severe MR most probably   overestimated.   Mitral leaflets are thickened and mildly calcified. There is   severe mitral regurgitation (RV = 54ml, ERO 50 mm2).   Moderate aortic regurgitation.   Moderately dilated RV with normal RVEF. Severe pulmonary   hypertension.  Recent Labs: 04/26/2016: ALT 20; B Natriuretic Peptide 2,761.6 05/18/2016: BUN 26; Creat 1.64; Hemoglobin 11.0; Platelets 199; Potassium 4.2; Sodium 139    Lipid Panel No results found for: CHOL, TRIG, HDL, CHOLHDL, VLDL, LDLCALC, LDLDIRECT   Wt Readings from Last 3 Encounters:  08/30/16 158 lb 3.2 oz (71.8 kg)  06/14/16 156 lb 6.4 oz (70.9 kg)  05/05/16 151 lb 6.4 oz (68.7 kg)     Other studies Reviewed: Additional studies/ records that were reviewed today include: office notes, hospital records and testing.  ASSESSMENT AND PLAN:  1.  Severe MR: Pt needs TEE to further evaluate him. He is  willing to have the procedure, it will be scheduled.  2. CHF, hx LVD: Pt also needs R/L heart cath to evaluate pressures and for possible CAD. Get this the same day as the TEE.  3. CKD III: BUN/Cr were 26/1.64 on 05/18/2016. Recheck. Initial BUN/Cr in our system 34/1.66, peak 42/2.06.    Current medicines are reviewed at length with the patient today.  The patient does not have concerns regarding medicines.  The following changes have been made:  no change to baseline meds but hold Lasix the day of the procedure  Labs/ tests ordered today include:   Orders Placed This Encounter  Procedures  .  Basic metabolic panel  . Protime-INR  . CBC  . ECHO TEE     Disposition:   FU with Dr Martinique  Signed, Rosaria Ferries, PA-C  08/30/2016 3:45 PM    [place] Phone: [phone]; Fax: [phone]  This note was written with the assistance of speech recognition software. Please excuse any transcriptional errors.

## 2016-08-31 ENCOUNTER — Other Ambulatory Visit: Payer: Self-pay | Admitting: *Deleted

## 2016-08-31 LAB — BASIC METABOLIC PANEL
BUN: 23 mg/dL (ref 7–25)
CHLORIDE: 103 mmol/L (ref 98–110)
CO2: 25 mmol/L (ref 20–31)
CREATININE: 1.49 mg/dL — AB (ref 0.70–1.25)
Calcium: 9.3 mg/dL (ref 8.6–10.3)
GLUCOSE: 83 mg/dL (ref 65–99)
POTASSIUM: 4.3 mmol/L (ref 3.5–5.3)
Sodium: 140 mmol/L (ref 135–146)

## 2016-09-06 ENCOUNTER — Ambulatory Visit (HOSPITAL_BASED_OUTPATIENT_CLINIC_OR_DEPARTMENT_OTHER): Payer: Medicare HMO

## 2016-09-06 ENCOUNTER — Ambulatory Visit (HOSPITAL_COMMUNITY)
Admission: RE | Admit: 2016-09-06 | Discharge: 2016-09-06 | Disposition: A | Discharge status: Home / Self Care | Payer: Medicare HMO | Source: Ambulatory Visit | Attending: Cardiology | Admitting: Cardiology

## 2016-09-06 ENCOUNTER — Encounter (HOSPITAL_COMMUNITY)
Admission: RE | Disposition: A | Discharge status: Home / Self Care | Payer: Self-pay | Source: Ambulatory Visit | Attending: Cardiology

## 2016-09-06 ENCOUNTER — Encounter (HOSPITAL_COMMUNITY): Payer: Self-pay | Admitting: *Deleted

## 2016-09-06 DIAGNOSIS — Z8546 Personal history of malignant neoplasm of prostate: Secondary | ICD-10-CM | POA: Diagnosis not present

## 2016-09-06 DIAGNOSIS — I509 Heart failure, unspecified: Secondary | ICD-10-CM | POA: Insufficient documentation

## 2016-09-06 DIAGNOSIS — I272 Pulmonary hypertension, unspecified: Secondary | ICD-10-CM | POA: Diagnosis not present

## 2016-09-06 DIAGNOSIS — I340 Nonrheumatic mitral (valve) insufficiency: Secondary | ICD-10-CM

## 2016-09-06 DIAGNOSIS — I2510 Atherosclerotic heart disease of native coronary artery without angina pectoris: Secondary | ICD-10-CM | POA: Diagnosis not present

## 2016-09-06 DIAGNOSIS — N183 Chronic kidney disease, stage 3 unspecified: Secondary | ICD-10-CM | POA: Diagnosis present

## 2016-09-06 DIAGNOSIS — Z823 Family history of stroke: Secondary | ICD-10-CM | POA: Insufficient documentation

## 2016-09-06 DIAGNOSIS — E785 Hyperlipidemia, unspecified: Secondary | ICD-10-CM | POA: Diagnosis not present

## 2016-09-06 DIAGNOSIS — Z7982 Long term (current) use of aspirin: Secondary | ICD-10-CM | POA: Insufficient documentation

## 2016-09-06 DIAGNOSIS — Z87891 Personal history of nicotine dependence: Secondary | ICD-10-CM | POA: Insufficient documentation

## 2016-09-06 DIAGNOSIS — I130 Hypertensive heart and chronic kidney disease with heart failure and stage 1 through stage 4 chronic kidney disease, or unspecified chronic kidney disease: Secondary | ICD-10-CM | POA: Insufficient documentation

## 2016-09-06 DIAGNOSIS — I071 Rheumatic tricuspid insufficiency: Secondary | ICD-10-CM | POA: Diagnosis present

## 2016-09-06 DIAGNOSIS — I4510 Unspecified right bundle-branch block: Secondary | ICD-10-CM | POA: Diagnosis present

## 2016-09-06 HISTORY — PX: CARDIAC CATHETERIZATION: SHX172

## 2016-09-06 HISTORY — PX: TEE WITHOUT CARDIOVERSION: SHX5443

## 2016-09-06 LAB — BASIC METABOLIC PANEL
Anion gap: 8 (ref 5–15)
BUN: 19 mg/dL (ref 6–20)
CALCIUM: 9.2 mg/dL (ref 8.9–10.3)
CO2: 28 mmol/L (ref 22–32)
CREATININE: 1.43 mg/dL — AB (ref 0.61–1.24)
Chloride: 102 mmol/L (ref 101–111)
GFR calc non Af Amer: 50 mL/min — ABNORMAL LOW (ref >60)
GFR, EST AFRICAN AMERICAN: 58 mL/min — AB (ref >60)
Glucose, Bld: 96 mg/dL (ref 65–99)
Potassium: 3.9 mmol/L (ref 3.5–5.1)
SODIUM: 138 mmol/L (ref 135–145)

## 2016-09-06 SURGERY — RIGHT/LEFT HEART CATH AND CORONARY ANGIOGRAPHY

## 2016-09-06 SURGERY — ECHOCARDIOGRAM, TRANSESOPHAGEAL
Anesthesia: Moderate Sedation

## 2016-09-06 MED ORDER — SODIUM CHLORIDE 0.9% FLUSH: 3.0000 mL | INTRAVENOUS | Status: DC | PRN

## 2016-09-06 MED ORDER — BUTAMBEN-TETRACAINE-BENZOCAINE 2-2-14 % EX AERO
INHALATION_SPRAY | CUTANEOUS | Status: DC | PRN
  Administered 2016-09-06: 2 via TOPICAL

## 2016-09-06 MED ORDER — SODIUM CHLORIDE 0.9 % WEIGHT BASED INFUSION: 1.0000 mL/kg/h | INTRAVENOUS | Status: AC

## 2016-09-06 MED ORDER — LIDOCAINE HCL (PF) 1 % IJ SOLN
INTRAMUSCULAR | Status: DC | PRN
  Administered 2016-09-06: 2 mL
  Administered 2016-09-06: 15 mL

## 2016-09-06 MED ORDER — SODIUM CHLORIDE 0.9% FLUSH: 3.0000 mL | Freq: Two times a day (BID) | INTRAVENOUS | Status: DC

## 2016-09-06 MED ORDER — ASPIRIN 81 MG PO CHEW: 81.0000 mg | CHEWABLE_TABLET | ORAL | Status: DC

## 2016-09-06 MED ORDER — SODIUM CHLORIDE 0.9 % IV SOLN
INTRAVENOUS | Status: DC
  Administered 2016-09-06: 08:00:00 via INTRAVENOUS

## 2016-09-06 MED ORDER — SODIUM CHLORIDE 0.9 % IV SOLN: 250.0000 mL | INTRAVENOUS | Status: DC | PRN

## 2016-09-06 MED ORDER — LIDOCAINE HCL (PF) 1 % IJ SOLN
INTRAMUSCULAR | Status: AC
  Filled 2016-09-06: qty 30

## 2016-09-06 MED ORDER — FENTANYL CITRATE (PF) 100 MCG/2ML IJ SOLN
INTRAMUSCULAR | Status: DC | PRN
  Administered 2016-09-06: 50 ug via INTRAVENOUS
  Administered 2016-09-06 (×2): 25 ug via INTRAVENOUS

## 2016-09-06 MED ORDER — HEPARIN (PORCINE) IN NACL 2-0.9 UNIT/ML-% IJ SOLN
INTRAMUSCULAR | Status: DC | PRN
  Administered 2016-09-06: 1000 mL

## 2016-09-06 MED ORDER — MIDAZOLAM HCL 5 MG/ML IJ SOLN
INTRAMUSCULAR | Status: AC
  Filled 2016-09-06: qty 2

## 2016-09-06 MED ORDER — VERAPAMIL HCL 2.5 MG/ML IV SOLN
INTRAVENOUS | Status: AC
  Filled 2016-09-06: qty 2

## 2016-09-06 MED ORDER — IOPAMIDOL (ISOVUE-370) INJECTION 76%
INTRAVENOUS | Status: AC
  Filled 2016-09-06: qty 100

## 2016-09-06 MED ORDER — MIDAZOLAM HCL 10 MG/2ML IJ SOLN
INTRAMUSCULAR | Status: DC | PRN
  Administered 2016-09-06: 1 mg via INTRAVENOUS
  Administered 2016-09-06 (×2): 2 mg via INTRAVENOUS

## 2016-09-06 MED ORDER — DIPHENHYDRAMINE HCL 50 MG/ML IJ SOLN
INTRAMUSCULAR | Status: AC
  Filled 2016-09-06: qty 1

## 2016-09-06 MED ORDER — HEPARIN (PORCINE) IN NACL 2-0.9 UNIT/ML-% IJ SOLN
INTRAMUSCULAR | Status: AC
  Filled 2016-09-06: qty 1000

## 2016-09-06 MED ORDER — SODIUM CHLORIDE 0.9 % IV SOLN: INTRAVENOUS | Status: DC

## 2016-09-06 MED ORDER — DIPHENHYDRAMINE HCL 50 MG/ML IJ SOLN
INTRAMUSCULAR | Status: DC | PRN
  Administered 2016-09-06: 25 mg via INTRAVENOUS

## 2016-09-06 MED ORDER — IOPAMIDOL (ISOVUE-370) INJECTION 76%
INTRAVENOUS | Status: DC | PRN
  Administered 2016-09-06: 40 mL via INTRA_ARTERIAL

## 2016-09-06 MED ORDER — FENTANYL CITRATE (PF) 100 MCG/2ML IJ SOLN
INTRAMUSCULAR | Status: AC
  Filled 2016-09-06: qty 2

## 2016-09-06 SURGICAL SUPPLY — 18 items
CATH BALLN WEDGE 5F 110CM (CATHETERS) ×3 IMPLANT
CATH EXPO 5F FL5 (CATHETERS) ×3 IMPLANT
CATH INFINITI 5FR MULTPACK ANG (CATHETERS) ×3 IMPLANT
CATH SWAN GANZ 7F STRAIGHT (CATHETERS) ×3 IMPLANT
GLIDESHEATH SLEND SS 6F .021 (SHEATH) ×3 IMPLANT
GUIDEWIRE INQWIRE 1.5J.035X260 (WIRE) ×1 IMPLANT
INQWIRE 1.5J .035X260CM (WIRE) ×3
KIT HEART LEFT (KITS) ×3 IMPLANT
PACK CARDIAC CATHETERIZATION (CUSTOM PROCEDURE TRAY) ×3 IMPLANT
SHEATH FAST CATH BRACH 5F 5CM (SHEATH) ×3 IMPLANT
SHEATH PINNACLE 5F 10CM (SHEATH) ×3 IMPLANT
SHEATH PINNACLE 7F 10CM (SHEATH) ×3 IMPLANT
TRANSDUCER W/STOPCOCK (MISCELLANEOUS) ×3 IMPLANT
TUBING ART PRESS 72  MALE/FEM (TUBING) ×2
TUBING ART PRESS 72 MALE/FEM (TUBING) ×1 IMPLANT
TUBING CIL FLEX 10 FLL-RA (TUBING) ×3 IMPLANT
WIRE EMERALD 3MM-J .025X260CM (WIRE) ×3 IMPLANT
WIRE EMERALD 3MM-J .035X150CM (WIRE) ×3 IMPLANT

## 2016-09-06 NOTE — Interval H&P Note (Signed)
History and Physical Interval Note:  09/06/2016 10:52 AM  Aaron Bennett  has presented today for surgery, with the diagnosis of cp  The various methods of treatment have been discussed with the patient and family. After consideration of risks, benefits and other options for treatment, the patient has consented to  Procedure(s): Right/Left Heart Cath and Coronary Angiography (N/A) as a surgical intervention .  The patient's history has been reviewed, patient examined, no change in status, stable for surgery.  I have reviewed the patient's chart and labs.  Questions were answered to the patient's satisfaction.      Collier [facility] 09/06/2016  10:52 AM

## 2016-09-06 NOTE — Progress Notes (Signed)
  Echocardiogram Echocardiogram Transesophageal has been performed.  Jennette Dubin 09/06/2016, 10:21 AM

## 2016-09-06 NOTE — Discharge Instructions (Signed)

## 2016-09-06 NOTE — H&P (View-Only) (Signed)
Cardiology Office Note   Date:  08/30/2016   ID:  Aaron Bennett, DOB 06-19-1951, MRN [id number]  PCP:  Pcp Not In System  Cardiologist:  Dr Martinique  Hao Meng 06/08/2016 Rosaria Ferries, PA-C   Chief Complaint  Patient presents with  . Follow-up    Discuss cath and TEE    History of Present Illness: Aaron Bennett is a 66 y.o. male with a history of  HTN, HLD, prostate CA, CKD stage III, and h/o alcohol abuse. EF 30-35% at echo during admit for CHF to [facility] [id number], complicated by ETOH withdrawal and sz.   04/27/2016 admit to [facility] for CHF, EF now 50-55% w/ severe MR.  06/14/2016 pt seen by Almyra Deforest and scheduled for TEE w/ R/L cath to eval valve. Procedure rescheduled, additional office visit needed to make sure pt ok for procedure.  Aaron Bennett presents for cardiology evaluation. His nephew is with him today.  He has been doing fairly well. He denies LE edema, PND or orthopnea. His DOE is at baseline, but he can only walk about 100 feet without stopping.   He never gets palpitations and never feels his heart skipping or racing. He has not had any chest pain. He has not had ETOH. He is compliant with his medications.   Past Medical History:  Diagnosis Date  . CHF (congestive heart failure) ([place])    a. 04/2016: echo with EF of 50-55%, previously reported 30-35% by outside records.  . CKD (chronic kidney disease) stage 3, GFR 30-59 ml/min   . H/O ETOH abuse   . Hypertension   . Mitral regurgitation    a. severe by echo in 04/2016  . Prostate cancer [facility])     Past Surgical History:  Procedure Laterality Date  . APPENDECTOMY    . PROSTATE SURGERY      Current Outpatient Prescriptions  Medication Sig Dispense Refill  . aspirin EC 81 MG tablet Take 1 tablet (81 mg total) by mouth daily. 30 tablet 11  . atorvastatin (LIPITOR) 40 MG tablet Take 40 mg by mouth daily.    . carvedilol (COREG) 6.25 MG tablet Take 1 tablet (6.25 mg total) by mouth 2 (two)  times daily with a meal. 60 tablet 6  . folic acid (FOLVITE) 1 MG tablet Take 1 tablet (1 mg total) by mouth daily. 30 tablet 11  . furosemide (LASIX) 40 MG tablet Take 1 tablet (40 mg total) by mouth daily. 30 tablet 6  . hydrALAZINE (APRESOLINE) 10 MG tablet Take 1 tablet (10 mg total) by mouth every 8 (eight) hours. 90 tablet 6  . isosorbide mononitrate (IMDUR) 30 MG 24 hr tablet Take 1 tablet (30 mg total) by mouth daily. 30 tablet 11  . Multiple Vitamin (MULTIVITAMIN) tablet Take 1 tablet by mouth daily.    Marland Kitchen thiamine (VITAMIN B-1) 100 MG tablet Take 1 tablet (100 mg total) by mouth daily. 30 tablet 11   No current facility-administered medications for this visit.     Allergies:   Patient has no known allergies.    Social History:  The patient  reports that he quit smoking about 4 months ago. He has a 50.00 pack-year smoking history. He has never used smokeless tobacco. He reports that he drinks alcohol.   Family History:  The patient's family history includes Lung disease in his mother; Stroke (age of onset: 36) in his father; Yves Dill Parkinson White syndrome in his brother.    ROS:  Please see the  history of present illness. All other systems are reviewed and negative.    PHYSICAL EXAM: VS:  BP 102/78   Pulse 90   Ht 5\' 11"  (1.803 m)   Wt 158 lb 3.2 oz (71.8 kg)   SpO2 94%   BMI 22.06 kg/m  , BMI Body mass index is 22.06 kg/m. GEN: Well nourished, well developed, male in no acute distress  HEENT: normal for age  Neck: JVD 8 cm, no carotid bruit, no masses Cardiac: RRR; 2-3/6 murmur, no rubs, or gallops Respiratory: decreased BS bases bilaterally, normal work of breathing GI: soft, nontender, nondistended, + BS MS: no deformity or atrophy; no edema; distal pulses are 2+ in all 4 extremities   Skin: warm and dry, no rash Neuro:  Strength and sensation are intact Psych: euthymic mood, full affect   EKG:  EKG is not ordered today. The ECG from 08/30 demonstrates SR, inc  RBBB w/ QRS duration 119 ms  ECHO: 04/27/2016 - Left ventricle: The cavity size was normal. There was moderate   focal basal and moderate concentric hypertrophy of the left   ventricle. Systolic function was normal. The estimated ejection   fraction was in the range of 50% to 55%. Wall motion was normal;   there were no regional wall motion abnormalities. - Ventricular septum: The contour showed diastolic flattening and   systolic flattening. - Aortic valve: There was moderate regurgitation. - Mitral valve: There was severe regurgitation directed centrally   and posteriorly. Valve area by continuity equation (using LVOT   flow): 1.11 cm^2. - Left atrium: The atrium was moderately dilated. - Right ventricle: The cavity size was moderately dilated. Wall   thickness was normal. - Right atrium: The atrium was severely dilated. - Tricuspid valve: There was moderate regurgitation. - Pulmonary arteries: Systolic pressure was severely increased. PA   peak pressure: 71 mm Hg (S). Impressions: - Low normal LVEF, however with severe MR most probably   overestimated.   Mitral leaflets are thickened and mildly calcified. There is   severe mitral regurgitation (RV = 22ml, ERO 50 mm2).   Moderate aortic regurgitation.   Moderately dilated RV with normal RVEF. Severe pulmonary   hypertension.  Recent Labs: 04/26/2016: ALT 20; B Natriuretic Peptide 2,761.6 05/18/2016: BUN 26; Creat 1.64; Hemoglobin 11.0; Platelets 199; Potassium 4.2; Sodium 139    Lipid Panel No results found for: CHOL, TRIG, HDL, CHOLHDL, VLDL, LDLCALC, LDLDIRECT   Wt Readings from Last 3 Encounters:  08/30/16 158 lb 3.2 oz (71.8 kg)  06/14/16 156 lb 6.4 oz (70.9 kg)  05/05/16 151 lb 6.4 oz (68.7 kg)     Other studies Reviewed: Additional studies/ records that were reviewed today include: office notes, hospital records and testing.  ASSESSMENT AND PLAN:  1.  Severe MR: Pt needs TEE to further evaluate him. He is  willing to have the procedure, it will be scheduled.  2. CHF, hx LVD: Pt also needs R/L heart cath to evaluate pressures and for possible CAD. Get this the same day as the TEE.  3. CKD III: BUN/Cr were 26/1.64 on 05/18/2016. Recheck. Initial BUN/Cr in our system 34/1.66, peak 42/2.06.    Current medicines are reviewed at length with the patient today.  The patient does not have concerns regarding medicines.  The following changes have been made:  no change to baseline meds but hold Lasix the day of the procedure  Labs/ tests ordered today include:   Orders Placed This Encounter  Procedures  .  Basic metabolic panel  . Protime-INR  . CBC  . ECHO TEE     Disposition:   FU with Dr Martinique  Signed, Rosaria Ferries, PA-C  08/30/2016 3:45 PM    [place] Phone: [phone]; Fax: [phone]  This note was written with the assistance of speech recognition software. Please excuse any transcriptional errors.

## 2016-09-06 NOTE — Interval H&P Note (Signed)
History and Physical Interval Note:  09/06/2016 8:51 AM  Aaron Bennett  has presented today for surgery, with the diagnosis of SEVERE MITRAL REGURGATION  The various methods of treatment have been discussed with the patient and family. After consideration of risks, benefits and other options for treatment, the patient has consented to  Procedure(s): TRANSESOPHAGEAL ECHOCARDIOGRAM (TEE) (N/A) as a surgical intervention .  The patient's history has been reviewed, patient examined, no change in status, stable for surgery.  I have reviewed the patient's chart and labs.  Questions were answered to the patient's satisfaction.     [name]

## 2016-09-06 NOTE — CV Procedure (Addendum)
    Transesophageal Echocardiogram Note  Aaron Bennett [id number] 1951/03/27  Procedure: Transesophageal Echocardiogram Indications: mitral regurgitation   Procedure Details Consent: Obtained Time Out: Verified patient identification, verified procedure, site/side was marked, verified correct patient position, special equipment/implants available, Radiology Safety Procedures followed,  medications/allergies/relevent history reviewed, required imaging and test results available.  Performed  Medications:  During this procedure the patient is administered a total of Versed 5  mg and Fentanyl 100 mcg  to achieve and maintain moderate conscious sedation.  The patient's heart rate, blood pressure, and oxygen saturation are monitored continuously during the procedure. The period of conscious sedation is 30 minutes, of which I was present face-to-face 100% of this time.  Left Ventrical:  Normal LV systolic function  Mitral Valve: severe MR, the MR jet is broad and is mostly central .  No vegetation. There is diastolic reversal of flow in the pulmonary arteries   Aortic Valve: mild - mod AI   Tricuspid Valve: mild TR   Pulmonic Valve: no significant PI   Left Atrium/ Left atrial appendage: no thrombus   Atrial septum: intact by color flow   Aorta: mild calcified atheroma   Complications: No apparent complications Patient did tolerate procedure well.   Thayer Headings, Brooke Bonito., MD, [facility] 09/06/2016, 9:25 AM

## 2016-09-06 NOTE — Progress Notes (Signed)
71f sheath removed from Rt femoral artery at 1250 and 58f sheath removed from rt femoral vein at 1300.  Manual pressure applied to rt groin for a total of 20 min.  Bedrest starts at 1310.  Rt DP palpated.  Post sheath removal instruction given.  Pt understands.  Rt groin is level 0.

## 2016-09-07 ENCOUNTER — Encounter (HOSPITAL_COMMUNITY): Payer: Self-pay | Admitting: Cardiology

## 2016-09-11 LAB — POCT I-STAT 3, ART BLOOD GAS (G3+)
Acid-Base Excess: 1 mmol/L (ref 0.0–2.0)
Bicarbonate: 26.2 mmol/L (ref 20.0–28.0)
O2 Saturation: 98 %
TCO2: 28 mmol/L (ref 0–100)
pCO2 arterial: 42.7 mmHg (ref 32.0–48.0)
pH, Arterial: 7.396 (ref 7.350–7.450)
pO2, Arterial: 106 mmHg (ref 83.0–108.0)

## 2016-09-11 LAB — POCT I-STAT 3, VENOUS BLOOD GAS (G3P V)
BICARBONATE: 25.7 mmol/L (ref 20.0–28.0)
BICARBONATE: 26.5 mmol/L (ref 20.0–28.0)
O2 SAT: 44 %
O2 SAT: 47 %
PCO2 VEN: 48.3 mmHg (ref 44.0–60.0)
PO2 VEN: 26 mmHg — AB (ref 32.0–45.0)
TCO2: 27 mmol/L (ref 0–100)
TCO2: 28 mmol/L (ref 0–100)
pCO2, Ven: 46.9 mmHg (ref 44.0–60.0)
pH, Ven: 7.346 (ref 7.250–7.430)
pH, Ven: 7.347 (ref 7.250–7.430)
pO2, Ven: 27 mmHg — CL (ref 32.0–45.0)

## 2016-09-19 ENCOUNTER — Encounter: Payer: Self-pay | Admitting: Thoracic Surgery (Cardiothoracic Vascular Surgery)

## 2016-09-19 ENCOUNTER — Other Ambulatory Visit: Payer: Self-pay | Admitting: *Deleted

## 2016-09-19 ENCOUNTER — Institutional Professional Consult (permissible substitution) (INDEPENDENT_AMBULATORY_CARE_PROVIDER_SITE_OTHER): Payer: Medicare HMO | Admitting: Thoracic Surgery (Cardiothoracic Vascular Surgery)

## 2016-09-19 VITALS — BP 96/73 | HR 81 | Resp 16 | Ht 71.0 in | Wt 155.0 lb

## 2016-09-19 DIAGNOSIS — I5042 Chronic combined systolic (congestive) and diastolic (congestive) heart failure: Secondary | ICD-10-CM | POA: Diagnosis not present

## 2016-09-19 DIAGNOSIS — I340 Nonrheumatic mitral (valve) insufficiency: Secondary | ICD-10-CM | POA: Diagnosis not present

## 2016-09-19 DIAGNOSIS — Z01818 Encounter for other preprocedural examination: Secondary | ICD-10-CM

## 2016-09-19 DIAGNOSIS — I7409 Other arterial embolism and thrombosis of abdominal aorta: Secondary | ICD-10-CM

## 2016-09-19 DIAGNOSIS — I071 Rheumatic tricuspid insufficiency: Secondary | ICD-10-CM

## 2016-09-19 DIAGNOSIS — I71019 Dissection of thoracic aorta, unspecified: Secondary | ICD-10-CM

## 2016-09-19 DIAGNOSIS — I361 Nonrheumatic tricuspid (valve) insufficiency: Secondary | ICD-10-CM | POA: Diagnosis not present

## 2016-09-19 DIAGNOSIS — I7101 Dissection of thoracic aorta: Secondary | ICD-10-CM

## 2016-09-19 NOTE — Patient Instructions (Signed)
Continue all previous medications without any changes at this time  

## 2016-09-19 NOTE — Progress Notes (Signed)
[address]       [place] [address]             [phone]     CARDIOTHORACIC SURGERY CONSULTATION REPORT  Referring Provider is Martinique, Peter M, MD PCP is Pcp Not In System  Chief Complaint  Patient presents with  . Mitral Regurgitation    ECHO 04/27/16, CATH/TEE 09/06/16    HPI:  Patient is a 66 year old African-American male with history of mitral regurgitation, tricuspid regurgitation, chronic combined systolic and diastolic congestive heart failure, RBBB, hypertension, stage III chronic kidney disease, hyperlipidemia, previous heavy alcohol abuse and tobacco abuse, and prostate cancer who has been referred for surgical consultation to discuss treatment options for management of severe symptomatic mitral regurgitation. The patient reportedly has a long history of heavy alcohol and tobacco abuse. He began to feel poorly more than a year ago and made efforts to cut back and eventually quit drinking and smoking completely. He successfully stopped drinking and smoking last July. At that time he presented with symptoms of class IV congestive heart failure and a brief syncopal episode that was attributed to possible alcohol withdrawal seizure. He was hospitalized at [facility] and started on medical therapy for congestive heart failure. Transthoracic echocardiogram performed at that time revealed severe mitral regurgitation, moderate tricuspid regurgitation and left ventricular ejection fraction estimated 50-55%. Symptoms improved and the patient has continued to abstain from any tobacco or alcohol use. He has been seen in follow-up on several occasions at [facility] and has continued to complain of symptoms of exertional shortness of breath despite remaining compliant with optimal medical therapy for congestive heart failure.  He was seen in follow-up recently and scheduled for transesophageal echocardiogram and diagnostic cardiac catheterization.   TEE performed 09/06/2016 confirmed the presence of severe mitral regurgitation, mild aortic insufficiency, and mild to moderate tricuspid regurgitation. Left ventricular ejection fraction was reported 50-55%. Left and right heart catheterization performed 09/06/2016 was notable for the absence of significant coronary artery disease. The patient had moderate to severe pulmonary hypertension with large V waves on wedge tracing consistent with severe mitral regurgitation. The patient was referred for surgical consultation.  The patient has been retired for approximately 6 years, having previously worked as a Horticulturist, commercial. He admits to long-standing heavy alcohol and tobacco abuse although he has been completely abstinent since July 2017. He admits that he does use E cigarettes but he has not smoked any cigarettes since last summer. He has not had any alcohol. At the time he presented last July he complained of symptoms consistent with acute on chronic combined systolic and diastolic congestive heart failure, functional class IV. At present the patient describes stable symptoms of exertional shortness of breath that occur with low-level activity. He denies resting shortness of breath, PND, orthopnea, palpitations, dizzy spells, or syncope. He does have some mild lower extremity edema. He denies any exertional chest pain or chest tightness. He previously lived in [place], but he moved to [place] where he now lives with his sister where he has much improved family and social support. He reports that his physical activity is limited only by exertional shortness of breath.  Past Medical History:  Diagnosis Date  . CHF (congestive heart failure) ([place])    a. 04/2016: echo with EF of 50-55%, previously reported 30-35% by outside records.  . Chronic combined systolic and diastolic congestive heart failure ([place])   . CKD (chronic kidney disease) stage 3,  GFR 30-59 ml/min   . H/O ETOH abuse   . Hypertension   .  Mitral regurgitation    a. severe by echo in 04/2016  . Prostate cancer [facility])     Past Surgical History:  Procedure Laterality Date  . APPENDECTOMY    . CARDIAC CATHETERIZATION N/A 09/06/2016   Procedure: Right/Left Heart Cath and Coronary Angiography;  Surgeon: Peter M Martinique, MD;  Location: [facility];  Service: Cardiovascular;  Laterality: N/A;  . PROSTATE SURGERY    . TEE WITHOUT CARDIOVERSION N/A 09/06/2016   Procedure: TRANSESOPHAGEAL ECHOCARDIOGRAM (TEE);  Surgeon: Thayer Headings, MD;  Location: [facility] ENDOSCOPY;  Service: Cardiovascular;  Laterality: N/A;    Family History  Problem Relation Age of Onset  . Lung disease Mother   . Stroke Father 21  . Mansfield White syndrome Brother     Social History   Social History  . Marital status: Single    Spouse name: N/A  . Number of children: N/A  . Years of education: N/A   Occupational History  . Not on file.   Social History Main Topics  . Smoking status: Former Smoker    Packs/day: 1.00    Years: 50.00    Quit date: 04/16/2016  . Smokeless tobacco: Never Used  . Alcohol use Yes     Comment: 1/2 pint  . Drug use: Unknown  . Sexual activity: Not on file   Other Topics Concern  . Not on file   Social History Narrative  . No narrative on file    Current Outpatient Prescriptions  Medication Sig Dispense Refill  . aspirin EC 81 MG tablet Take 1 tablet (81 mg total) by mouth daily. 30 tablet 11  . atorvastatin (LIPITOR) 40 MG tablet Take 40 mg by mouth daily.    . carvedilol (COREG) 6.25 MG tablet Take 1 tablet (6.25 mg total) by mouth 2 (two) times daily with a meal. 60 tablet 6  . folic acid (FOLVITE) 1 MG tablet Take 1 tablet (1 mg total) by mouth daily. 30 tablet 11  . furosemide (LASIX) 40 MG tablet Take 1 tablet (40 mg total) by mouth daily. 30 tablet 6  . hydrALAZINE (APRESOLINE) 10 MG tablet Take 1 tablet (10 mg total) by mouth every 8 (eight) hours. 90 tablet 6  . isosorbide mononitrate (IMDUR)  30 MG 24 hr tablet Take 1 tablet (30 mg total) by mouth daily. 30 tablet 11  . Multiple Vitamin (MULTIVITAMIN) tablet Take 1 tablet by mouth daily.    Marland Kitchen thiamine (VITAMIN B-1) 100 MG tablet Take 1 tablet (100 mg total) by mouth daily. 30 tablet 11   No current facility-administered medications for this visit.     No Known Allergies    Review of Systems:   General:  normal appetite, decreased energy, no weight gain, no weight loss, no fever  Cardiac:  no chest pain with exertion, no chest pain at rest, + SOB with mild exertion, no resting SOB, no PND, no orthopnea, no palpitations, no arrhythmia, no atrial fibrillation, mild bilateral LE edema R>L, no dizzy spells, no syncope  Respiratory:  + shortness of breath, no home oxygen, no productive cough, occasional dry cough, no bronchitis, no wheezing, no hemoptysis, no asthma, no pain with inspiration or cough, no sleep apnea, no CPAP at night  GI:   no difficulty swallowing, no reflux, no frequent heartburn, no hiatal hernia, no abdominal pain, no constipation, no diarrhea, no hematochezia, no hematemesis, no melena  GU:   no dysuria,  no frequency, no urinary tract infection, no hematuria, no enlarged prostate, no kidney stones, + kidney disease  Vascular:  no pain suggestive of claudication, no pain in feet, no leg cramps, no varicose veins, no DVT, no non-healing foot ulcer  Neuro:   no stroke, no TIA's, + seizures, no headaches, no temporary blindness one eye,  no slurred speech, no peripheral neuropathy, no chronic pain, no instability of gait, no memory/cognitive dysfunction  Musculoskeletal: no arthritis, no joint swelling, no myalgias, no difficulty walking, normal mobility   Skin:   no rash, no itching, no skin infections, no pressure sores or ulcerations  Psych:   no anxiety, no depression, no nervousness, no unusual recent stress  Eyes:   no blurry vision, no floaters, no recent vision changes, does not wear glasses or  contacts  ENT:   no hearing loss, edentulous, no dentures, last saw dentist many years ago  Hematologic:  no easy bruising, no abnormal bleeding, no clotting disorder, no frequent epistaxis  Endocrine:  no diabetes, does not check CBG's at home     Physical Exam:   BP 96/73 (BP Location: Left Arm, Patient Position: Sitting, Cuff Size: Large)   Pulse 81   Resp 16   Ht 5\' 11"  (1.803 m)   Wt 155 lb (70.3 kg)   SpO2 99% Comment: ON RA  BMI 21.62 kg/m   General:  Thin,  well-appearing  HEENT:  Unremarkable   Neck:   no JVD, no bruits, no adenopathy   Chest:   clear to auscultation, symmetrical breath sounds, no wheezes, no rhonchi   CV:   RRR, grade IV/VI holosystolic murmur   Abdomen:  soft, non-tender, no masses   Extremities:  warm, well-perfused, pulses diminished, no LE edema  Rectal/GU  Deferred  Neuro:   Grossly non-focal and symmetrical throughout  Skin:   Clean and dry, no rashes, no breakdown   Diagnostic Tests:  Transthoracic Echocardiography  Patient:    Aaron Bennett, Aaron Bennett MR #:       [id number] Study Date: 04/27/2016 Gender:     M Age:        65 Height:     180.3 cm Weight:     64.7 kg BSA:        1.79 m^2 Pt. Status: Room:       3E20C   ADMITTING    Peter Martinique, M.D.  ATTENDING    Peter Martinique, M.D.  PERFORMING   [facility], Inpatient  ORDERING     Bhagat, Bhavinkumar  REFERRING    Leanor Kail  SONOGRAPHER  Mikki Santee  cc:  ------------------------------------------------------------------- LV EF: 50% -   55%  ------------------------------------------------------------------- Indications:      CHF - 428.0.  ------------------------------------------------------------------- History:   Risk factors:  History of alcohol abuse. Hypertension.   ------------------------------------------------------------------- Study Conclusions  - Left ventricle: The cavity size was normal. There was moderate   focal basal and moderate concentric  hypertrophy of the left   ventricle. Systolic function was normal. The estimated ejection   fraction was in the range of 50% to 55%. Wall motion was normal;   there were no regional wall motion abnormalities. - Ventricular septum: The contour showed diastolic flattening and   systolic flattening. - Aortic valve: There was moderate regurgitation. - Mitral valve: There was severe regurgitation directed centrally   and posteriorly. Valve area by continuity equation (using LVOT   flow): 1.11 cm^2. - Left atrium: The atrium was moderately dilated. -  Right ventricle: The cavity size was moderately dilated. Wall   thickness was normal. - Right atrium: The atrium was severely dilated. - Tricuspid valve: There was moderate regurgitation. - Pulmonary arteries: Systolic pressure was severely increased. PA   peak pressure: 71 mm Hg (S).  Impressions:  - Low normal LVEF, however with severe MR most probably   overestimated.   Mitral leaflets are thickened and mildly calcified. There is   severe mitral regurgitation (RV = 38ml, ERO 50 mm2).   Moderate aortic regurgitation.   Moderately dilated RV with normal RVEF. Severe pulmonary   hypertension.  ------------------------------------------------------------------- Study data:  No prior study was available for comparison.  Study status:  Routine.  Procedure:  The patient reported no pain pre or post test. Transthoracic echocardiography. Image quality was adequate.  Study completion:  There were no complications. Transthoracic echocardiography.  M-mode, complete 2D, spectral Doppler, and color Doppler.  Birthdate:  Patient birthdate: 06-30-1951.  Age:  Patient is 66 yr old.  Sex:  Gender: male. BMI: 19.9 kg/m^2.  Blood pressure:     110/72  Patient status: Inpatient.  Study date:  Study date: 04/27/2016. Study time: 01:08 PM.  Location:  Echo  laboratory.  -------------------------------------------------------------------  ------------------------------------------------------------------- Left ventricle:  The cavity size was normal. There was moderate focal basal and moderate concentric hypertrophy of the left ventricle. Systolic function was normal. The estimated ejection fraction was in the range of 50% to 55%. Wall motion was normal; there were no regional wall motion abnormalities.  ------------------------------------------------------------------- Aortic valve:   Trileaflet; normal thickness leaflets. Mobility was not restricted.  Doppler:  Transvalvular velocity was within the normal range. There was no stenosis. There was moderate regurgitation.  ------------------------------------------------------------------- Aorta:  Aortic root: The aortic root was normal in size.  ------------------------------------------------------------------- Mitral valve:   Structurally normal valve.   Mobility was not restricted.  Doppler:  Transvalvular velocity was within the normal range. There was no evidence for stenosis. There was severe regurgitation directed centrally and posteriorly.    Valve area by continuity equation (using LVOT flow): 1.11 cm^2. Indexed valve area by continuity equation (using LVOT flow): 0.62 cm^2/m^2. Mean gradient (D): 5 mm Hg. Peak gradient (D): 9 mm Hg.  ------------------------------------------------------------------- Left atrium:  The atrium was moderately dilated.  ------------------------------------------------------------------- Right ventricle:  The cavity size was moderately dilated. Wall thickness was normal. Systolic function was normal.  ------------------------------------------------------------------- Ventricular septum:   The contour showed diastolic flattening and systolic flattening.  ------------------------------------------------------------------- Pulmonic  valve:    Structurally normal valve.   Cusp separation was normal.  Doppler:  Transvalvular velocity was within the normal range. There was no evidence for stenosis. There was mild regurgitation.  ------------------------------------------------------------------- Tricuspid valve:   Structurally normal valve.    Doppler: Transvalvular velocity was within the normal range. There was moderate regurgitation.  ------------------------------------------------------------------- Pulmonary artery:   The main pulmonary artery was normal-sized. Systolic pressure was severely increased.  ------------------------------------------------------------------- Right atrium:  The atrium was severely dilated.  ------------------------------------------------------------------- Pericardium:  There was no pericardial effusion.  ------------------------------------------------------------------- Systemic veins: Inferior vena cava: The vessel was normal in size.  ------------------------------------------------------------------- Measurements   Left ventricle                            Value          Reference  LV ID, ED, PLAX chordal           (H)     56.1  mm       43 - 52  LV ID, ES, PLAX chordal           (H)     46.8  mm       23 - 38  LV fx shortening, PLAX chordal    (L)     17    %        >=29  LV PW thickness, ED                       10.4  mm       ---------  IVS/LV PW ratio, ED               (H)     1.49           <=1.3  Stroke volume, 2D                         45    ml       ---------  Stroke volume/bsa, 2D                     25    ml/m^2   ---------  LV ejection fraction, 1-p A4C             40    %        ---------  LV end-diastolic volume, 2-p              177   ml       ---------  LV end-systolic volume, 2-p               104   ml       ---------  LV ejection fraction, 2-p                 41    %        ---------  Stroke volume, 2-p                        73    ml        ---------  LV end-diastolic volume/bsa, 2-p          99    ml/m^2   ---------  LV end-systolic volume/bsa, 2-p           58    ml/m^2   ---------  Stroke volume/bsa, 2-p                    40.8  ml/m^2   ---------    Ventricular septum                        Value          Reference  IVS thickness, ED                         15.5  mm       ---------    LVOT                                      Value          Reference  LVOT ID, S  21    mm       ---------  LVOT area                                 3.46  cm^2     ---------  LVOT peak velocity, S                     64.3  cm/s     ---------  LVOT mean velocity, S                     42.5  cm/s     ---------  LVOT VTI, S                               12.9  cm       ---------    Aortic valve                              Value          Reference  Aortic regurg pressure half-time          298   ms       ---------    Aorta                                     Value          Reference  Aortic root ID, ED                        39    mm       ---------    Left atrium                               Value          Reference  LA ID, A-P, ES                            45    mm       ---------  LA ID/bsa, A-P                    (H)     2.51  cm/m^2   <=2.2  LA volume, ES, 1-p A4C                    111   ml       ---------  LA volume/bsa, ES, 1-p A4C                62    ml/m^2   ---------    Mitral valve                              Value          Reference  Mitral E-wave peak velocity               151   cm/s     ---------  Mitral mean velocity, D  103   cm/s     ---------  Mitral deceleration time          (H)     310   ms       150 - 230  Mitral mean gradient, D                   5     mm Hg    ---------  Mitral peak gradient, D                   9     mm Hg    ---------  Mitral valve area, LVOT                   1.11  cm^2     ---------  continuity  Mitral valve area/bsa, LVOT               0.62   cm^2/m^2 ---------  continuity  Mitral annulus VTI, D                     40.1  cm       ---------  Mitral regurg VTI, PISA                   153   cm       ---------  Mitral ERO, PISA                          0.24  cm^2     ---------  Mitral regurg volume, PISA                37    ml       ---------    Pulmonary arteries                        Value          Reference  PA pressure, S, DP                (H)     71    mm Hg    <=30    Tricuspid valve                           Value          Reference  Tricuspid regurg peak velocity            375   cm/s     ---------  Tricuspid peak RV-RA gradient             56    mm Hg    ---------    Systemic veins                            Value          Reference  Estimated CVP                             15    mm Hg    ---------    Right ventricle                           Value          Reference  TAPSE                                     13.6  mm       ---------  RV pressure, S, DP                (H)     71    mm Hg    <=30  RV s&', lateral, S                         11.7  cm/s     ---------    Pulmonic valve                            Value          Reference  Pulmonic regurg velocity, ED              168   cm/s     ---------  Pulmonic regurg gradient, ED              11    mm Hg    ---------  Legend: (L)  and  (H)  mark values outside specified reference range.  ------------------------------------------------------------------- Prepared and Electronically Authenticated by  Ena Dawley, M.D. 2017-08-31T19:34:29   Transesophageal Echocardiography  Patient:    Aaron Bennett, Aaron Bennett MR #:       [id number] Study Date: 09/06/2016 Gender:     M Age:        35 Height:     154.9 cm Weight:     71.8 kg BSA:        1.78 m^2 Pt. Status: Room:   ADMITTING    Peter Martinique, M.D.  ATTENDING    Peter Martinique, M.D.  PERFORMING   [name], M.D.  ORDERING     Barrett, Rhonda G  REFERRING    Barrett, Evelene Croon  SONOGRAPHER  Mikki Santee  cc:  ------------------------------------------------------------------- LV EF: 50% -   55%  ------------------------------------------------------------------- Indications:      MVD [non-rheumatic] 424.0.  ------------------------------------------------------------------- History:   PMH:   Congestive heart failure.  Risk factors:  History of alcohol abuse. Hypertension.  ------------------------------------------------------------------- Study Conclusions  - Left ventricle: Systolic function was normal. The estimated   ejection fraction was in the range of 50% to 55%. - Aortic valve: There was mild regurgitation. - Mitral valve: There was severe regurgitation. - Left atrium: No evidence of thrombus in the atrial cavity or   appendage. - Right ventricle: The cavity size was moderately dilated. Systolic   function was mildly to moderately reduced. - Tricuspid valve: There was mild-moderate regurgitation.  ------------------------------------------------------------------- Study data:   Study status:  Routine.  Consent:  The risks, benefits, and alternatives to the procedure were explained to the patient and informed consent was obtained.  Procedure:  The patient reported no pain pre or post test. Initial setup. The patient was brought to the laboratory. Surface ECG leads were monitored. Sedation. Conscious sedation was administered by cardiology staff. Transesophageal echocardiography. Topical anesthesia was obtained using viscous lidocaine. A transesophageal probe was inserted by the attending cardiologist. Image quality was adequate.  Study completion:  The patient tolerated the procedure well. There were no complications.  Administered medications:   Fentanyl, 158mcg. Midazolam, 5mg .          Diagnostic transesophageal echocardiography.  2D and color Doppler.  Birthdate:  Patient birthdate: 04-22-1951.  Age:  Patient is 66 yr old.  Sex:  Gender: male.     BMI: 29.9 kg/m^2.  Blood pressure:     86/68  Patient status:  Inpatient.  Study date:  Study date: 09/06/2016. Study time: 09:05 AM.  Location:  Endoscopy.  -------------------------------------------------------------------  ------------------------------------------------------------------- Left ventricle:  Systolic function was normal. The estimated ejection fraction was in the range of 50% to 55%.  ------------------------------------------------------------------- Aortic valve:   Doppler:  There was mild regurgitation.  ------------------------------------------------------------------- Aorta:  The aorta was mildly calcified.  ------------------------------------------------------------------- Mitral valve:  The MR is central . no evidence of a flail leaflet. no vegetation There is reversal of flow in the pulmonary veins. Doppler:  There was severe regurgitation.  ------------------------------------------------------------------- Left atrium:   No evidence of thrombus in the atrial cavity or appendage.  ------------------------------------------------------------------- Right ventricle:  The cavity size was moderately dilated. Systolic function was mildly to moderately reduced.  ------------------------------------------------------------------- Tricuspid valve:   The valve appears to be grossly normal. Doppler:  There was mild-moderate regurgitation.   ------------------------------------------------------------------- Post procedure conclusions Ascending Aorta:  - The aorta was mildly calcified.  ------------------------------------------------------------------- Prepared and Electronically Authenticated by  [name], M.D. 2018-01-10T18:03:20  Right/Left Heart Cath and Coronary Angiography  Conclusion     Mid RCA lesion, 20 %stenosed.  Hemodynamic findings consistent with moderate pulmonary hypertension.  LV end diastolic pressure is  normal.   1. No significant CAD 2. Moderate to severe pulmonary HTN 3. Elevated PCWP with prominent V waves c/w severe MR. 4. Reduced cardiac output/index.   Plan: consider patient for MVR versus repair.   Indications   Severe mitral insufficiency [I34.0 (ICD-10-CM)]  Procedural Details/Technique   Technical Details Indication: 66 yo BM with severe mitral insufficiency   Procedural Details: The left brachial vein was cannalized but due to acute angulation of the left subclavian vein the Swan catheter could not be advanced. The right groin was prepped, draped, and anesthetized with 1% lidocaine. Using the modified Seldinger technique a 5 Fr sheath was placed in the right femoral artery and a 7 French sheath was placed in the right femoral vein. A Swan-Ganz catheter was used for the right heart catheterization. Standard protocol was followed for recording of right heart pressures and sampling of oxygen saturations. Fick and thermodilution cardiac output was calculated. Standard Judkins catheters were used for selective coronary angiography and left ventricular pressures. There were no immediate procedural complications. The patient was transferred to the post catheterization recovery area for further monitoring.  Contrast: 40 cc   Estimated blood loss <50 mL.  During this procedure no sedation was administered.    Complications   Complications documented before study signed (09/06/2016 12:45 PM EST)    No complications were associated with this study.  Documented by Peter M Martinique, MD - 09/06/2016 12:17 PM EST    Coronary Findings   Dominance: Right  Left Main  Vessel was injected. Vessel is normal in caliber. Vessel is angiographically normal.  Left Anterior Descending  Vessel was injected. Vessel is normal in caliber. Vessel is angiographically normal.  Left Circumflex  Vessel was injected. Vessel is normal in caliber. Vessel is angiographically normal.  Right Coronary Artery   Mid RCA lesion, 20% stenosed.  Right Heart   Right Heart Pressures Hemodynamic findings consistent with moderate pulmonary hypertension. LV EDP is normal.    Coronary Diagrams   Diagnostic Diagram     Implants  No implant documentation for this case.  PACS Images   Show images for Cardiac catheterization   Link to Procedure Log   Procedure Log    Hemo Data   Flowsheet Row Most Recent Value  Fick Cardiac Output 3.04 L/min  Fick Cardiac Output Index 1.59 (L/min)/BSA  Thermal Cardiac Output 2.67 L/min  Thermal Cardiac Output Index 1.4 (L/min)/BSA  RA A Wave 8 mmHg  RA V Wave 9 mmHg  RA Mean 7 mmHg  RV Systolic Pressure 60 mmHg  RV Diastolic Pressure 2 mmHg  RV EDP 11 mmHg  PA Systolic Pressure 62 mmHg  PA Diastolic Pressure 23 mmHg  PA Mean 39 mmHg  PW A Wave 27 mmHg  PW V Wave 35 mmHg  PW Mean 26 mmHg  AO Systolic Pressure 85 mmHg  AO Diastolic Pressure 66 mmHg  AO Mean 76 mmHg  LV Systolic Pressure [id number] mmHg  LV Diastolic Pressure 4 mmHg  LV EDP 12 mmHg  Arterial Occlusion Pressure Extended Systolic Pressure 91 mmHg  Arterial Occlusion Pressure Extended Diastolic Pressure 63 mmHg  Arterial Occlusion Pressure Extended Mean Pressure 76 mmHg  Left Ventricular Apex Extended Systolic Pressure 94 mmHg  Left Ventricular Apex Extended Diastolic Pressure 2 mmHg  Left Ventricular Apex Extended EDP Pressure 10 mmHg  QP/QS 1  TPVR Index 27.66 HRUI  TSVR Index 47.77 HRUI  PVR SVR Ratio 0.28  TPVR/TSVR Ratio 0.58      Impression:  Patient has stage D severe symptomatic mitral regurgitation. 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 Although symptoms of  congestive heart failure have improved, he continues to experience exertional shortness of breath with low level activity consistent with chronic combined systolic and diastolic congestive heart failure, New York Heart Association functional class III. I have personally reviewed the patient's transthoracic and transesophageal echocardiograms and diagnostic cardiac catheterization. Left ventricular function is probably at least mildly globally reduced. Ejection fraction has been estimated 50-55% in the setting of severe mitral regurgitation. The left ventricle was mildly dilated. There are no obvious wall motion abnormalities. Functional anatomy of the patient's mitral regurgitation remains a bit unclear. There were very limited images obtained at the time of the patient's recent TEE. What images are available reveal fairly normal leaflet mobility with a broad central jet of mitral regurgitation. 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 The subvalvular apparatus was not image thoroughly on TEE but similar images suggest the possibility of an abnormality of the papillary muscles, possibly even congenital. Diagnostic cardiac catheterization is notable for the absence of significant coronary artery disease but the presence of moderate to severe pulmonary hypertension. The patient clearly needs surgery and I remain hopeful that his mitral valve will be repairable. He may need concomitant tricuspid valve repair. He appears to be a reasonably good candidate for minimally invasive approach for surgery.   Plan:  The patient and his nephew were counseled at length regarding the indications, risks and potential benefits of mitral valve repair.  The rationale for elective surgery has been explained, including a comparison between surgery and continued medical therapy with  close follow-up.  The likelihood of successful and durable valve repair has been discussed with particular reference to the findings of their recent echocardiogram.  Based upon these findings and previous experience, I have quoted them a greater than 80 percent likelihood of successful valve repair.  In the unlikely event that their valve cannot be successfully repaired, we discussed the possibility of replacing the mitral valve using a mechanical prosthesis with the attendant need for long-term anticoagulation versus the alternative of replacing it using a bioprosthetic tissue valve with its potential for late structural valve deterioration and failure, depending upon the patient's longevity.   The possibility that his tricuspid valve may need to be repaired has been discussed. The patient understands and accepts all potential risks of surgery including but not limited to risk of death, stroke or other neurologic complication, myocardial infarction, congestive heart failure, respiratory failure, renal failure, bleeding requiring transfusion and/or reexploration, arrhythmia, infection or other wound complications, pneumonia, pleural and/or pericardial effusion, pulmonary embolus, aortic dissection or other major vascular complication, or delayed complications related to valve repair or replacement including but not limited to structural valve deterioration and failure, thrombosis, embolization, endocarditis, or paravalvular leak. Alternative surgical approaches have been discussed including a comparison between conventional sternotomy and minimally-invasive techniques.  The relative risks and benefits of each have been reviewed as they pertain to the patient's specific circumstances, and all of their questions have been addressed.  Specific risks potentially related to the minimally-invasive approach were discussed at length, including but not limited to risk of conversion to full or partial sternotomy, aortic  dissection or other major vascular complication, unilateral acute lung injury or pulmonary edema, phrenic nerve dysfunction or paralysis, rib fracture, chronic pain, lung hernia, or lymphocele.  All of their questions have been answered.  The patient hopes to proceed with surgery in the near future, although for family reasons it will need to be postponed until mid February.  We tentatively plan to proceed with surgery on Wednesday, 10/18/2016. Patient will undergo formal pulmonary function testing to characterize presence of significant COPD. The patient will also undergo CT angiography of the aorta and iliac vessels to evaluate the feasibility of peripheral cannulation for surgery. The patient will return to our office for follow-up on Monday, 10/16/2016 prior to surgery.   I spent in excess of 90 minutes during the conduct of this office consultation and >50% of this time involved direct face-to-face encounter with the patient for counseling and/or coordination of their care.    Valentina Gu. Roxy Manns, MD 09/19/2016 10:59 AM

## 2016-10-16 ENCOUNTER — Ambulatory Visit
Admission: RE | Admit: 2016-10-16 | Discharge: 2016-10-16 | Disposition: A | Discharge status: Home / Self Care | Payer: Medicaid Other | Source: Ambulatory Visit | Attending: Thoracic Surgery (Cardiothoracic Vascular Surgery) | Admitting: Thoracic Surgery (Cardiothoracic Vascular Surgery)

## 2016-10-16 ENCOUNTER — Other Ambulatory Visit: Payer: Self-pay | Admitting: *Deleted

## 2016-10-16 ENCOUNTER — Ambulatory Visit (INDEPENDENT_AMBULATORY_CARE_PROVIDER_SITE_OTHER): Payer: Medicare HMO | Admitting: Thoracic Surgery (Cardiothoracic Vascular Surgery)

## 2016-10-16 ENCOUNTER — Encounter (HOSPITAL_COMMUNITY): Payer: Self-pay

## 2016-10-16 ENCOUNTER — Encounter (HOSPITAL_COMMUNITY)
Admission: RE | Admit: 2016-10-16 | Discharge: 2016-10-16 | Disposition: A | Discharge status: Home / Self Care | Payer: Medicare HMO | Source: Ambulatory Visit | Attending: Thoracic Surgery (Cardiothoracic Vascular Surgery) | Admitting: Thoracic Surgery (Cardiothoracic Vascular Surgery)

## 2016-10-16 ENCOUNTER — Ambulatory Visit (HOSPITAL_COMMUNITY)
Admission: RE | Admit: 2016-10-16 | Discharge: 2016-10-16 | Disposition: A | Discharge status: Home / Self Care | Payer: Medicare HMO | Source: Ambulatory Visit | Attending: Thoracic Surgery (Cardiothoracic Vascular Surgery) | Admitting: Thoracic Surgery (Cardiothoracic Vascular Surgery)

## 2016-10-16 ENCOUNTER — Encounter: Payer: Self-pay | Admitting: Thoracic Surgery (Cardiothoracic Vascular Surgery)

## 2016-10-16 VITALS — BP 106/75 | HR 94 | Resp 16 | Ht 71.0 in | Wt 155.0 lb

## 2016-10-16 DIAGNOSIS — I70208 Unspecified atherosclerosis of native arteries of extremities, other extremity: Secondary | ICD-10-CM | POA: Diagnosis not present

## 2016-10-16 DIAGNOSIS — I340 Nonrheumatic mitral (valve) insufficiency: Secondary | ICD-10-CM

## 2016-10-16 DIAGNOSIS — I700 Atherosclerosis of aorta: Secondary | ICD-10-CM | POA: Insufficient documentation

## 2016-10-16 DIAGNOSIS — I71019 Dissection of thoracic aorta, unspecified: Secondary | ICD-10-CM

## 2016-10-16 DIAGNOSIS — Z01818 Encounter for other preprocedural examination: Secondary | ICD-10-CM

## 2016-10-16 DIAGNOSIS — I071 Rheumatic tricuspid insufficiency: Secondary | ICD-10-CM

## 2016-10-16 DIAGNOSIS — Z01812 Encounter for preprocedural laboratory examination: Secondary | ICD-10-CM | POA: Diagnosis not present

## 2016-10-16 DIAGNOSIS — I517 Cardiomegaly: Secondary | ICD-10-CM | POA: Diagnosis not present

## 2016-10-16 DIAGNOSIS — Z01810 Encounter for preprocedural cardiovascular examination: Secondary | ICD-10-CM | POA: Insufficient documentation

## 2016-10-16 DIAGNOSIS — I7101 Dissection of thoracic aorta: Secondary | ICD-10-CM

## 2016-10-16 DIAGNOSIS — I4510 Unspecified right bundle-branch block: Secondary | ICD-10-CM | POA: Insufficient documentation

## 2016-10-16 DIAGNOSIS — I361 Nonrheumatic tricuspid (valve) insufficiency: Secondary | ICD-10-CM | POA: Diagnosis not present

## 2016-10-16 DIAGNOSIS — I7409 Other arterial embolism and thrombosis of abdominal aorta: Secondary | ICD-10-CM

## 2016-10-16 DIAGNOSIS — I5042 Chronic combined systolic (congestive) and diastolic (congestive) heart failure: Secondary | ICD-10-CM

## 2016-10-16 LAB — VAS US DOPPLER PRE CABG
LCCAPDIAS: 23 cm/s
LCCAPSYS: 63 cm/s
LEFT ECA DIAS: -10 cm/s
LEFT VERTEBRAL DIAS: -13 cm/s
LICAPSYS: -39 cm/s
Left CCA dist dias: -15 cm/s
Left CCA dist sys: -39 cm/s
Left ICA dist dias: -17 cm/s
Left ICA dist sys: -43 cm/s
Left ICA prox dias: -18 cm/s
RCCADSYS: -46 cm/s
RCCAPDIAS: 16 cm/s
RIGHT ECA DIAS: -11 cm/s
RIGHT VERTEBRAL DIAS: -14 cm/s
Right CCA prox sys: 46 cm/s

## 2016-10-16 LAB — COMPREHENSIVE METABOLIC PANEL
ALBUMIN: 3.6 g/dL (ref 3.5–5.0)
ALT: 10 U/L — AB (ref 17–63)
ANION GAP: 10 (ref 5–15)
AST: 17 U/L (ref 15–41)
Alkaline Phosphatase: 67 U/L (ref 38–126)
BUN: 23 mg/dL — ABNORMAL HIGH (ref 6–20)
CHLORIDE: 105 mmol/L (ref 101–111)
CO2: 24 mmol/L (ref 22–32)
Calcium: 9.1 mg/dL (ref 8.9–10.3)
Creatinine, Ser: 1.53 mg/dL — ABNORMAL HIGH (ref 0.61–1.24)
GFR calc Af Amer: 53 mL/min — ABNORMAL LOW (ref >60)
GFR calc non Af Amer: 46 mL/min — ABNORMAL LOW (ref >60)
Glucose, Bld: 91 mg/dL (ref 65–99)
Potassium: 4.3 mmol/L (ref 3.5–5.1)
SODIUM: 139 mmol/L (ref 135–145)
Total Bilirubin: 0.8 mg/dL (ref 0.3–1.2)
Total Protein: 7.6 g/dL (ref 6.5–8.1)

## 2016-10-16 LAB — CBC
HCT: 32.2 % — ABNORMAL LOW (ref 39.0–52.0)
Hemoglobin: 10.3 g/dL — ABNORMAL LOW (ref 13.0–17.0)
MCH: 32.4 pg (ref 26.0–34.0)
MCHC: 32 g/dL (ref 30.0–36.0)
MCV: 101.3 fL — ABNORMAL HIGH (ref 78.0–100.0)
PLATELETS: 204 10*3/uL (ref 150–400)
RBC: 3.18 MIL/uL — ABNORMAL LOW (ref 4.22–5.81)
RDW: 14.6 % (ref 11.5–15.5)
WBC: 5.2 10*3/uL (ref 4.0–10.5)

## 2016-10-16 LAB — PULMONARY FUNCTION TEST
DL/VA % pred: 74 %
DL/VA: 3.48 ml/min/mmHg/L
DLCO unc % pred: 41 %
DLCO unc: 13.85 ml/min/mmHg
FEF 25-75 PRE: 2.28 L/s
FEF 25-75 Post: 1.7 L/sec
FEF2575-%CHANGE-POST: -25 %
FEF2575-%PRED-POST: 61 %
FEF2575-%Pred-Pre: 82 %
FEV1-%CHANGE-POST: -7 %
FEV1-%PRED-POST: 62 %
FEV1-%PRED-PRE: 67 %
FEV1-POST: 1.96 L
FEV1-PRE: 2.11 L
FEV1FVC-%Change-Post: 0 %
FEV1FVC-%PRED-PRE: 105 %
FEV6-%Change-Post: -7 %
FEV6-%PRED-POST: 62 %
FEV6-%Pred-Pre: 66 %
FEV6-POST: 2.42 L
FEV6-PRE: 2.61 L
FEV6FVC-%PRED-PRE: 104 %
FEV6FVC-%Pred-Post: 104 %
FVC-%CHANGE-POST: -6 %
FVC-%PRED-POST: 59 %
FVC-%PRED-PRE: 64 %
FVC-POST: 2.43 L
FVC-PRE: 2.61 L
POST FEV1/FVC RATIO: 81 %
PRE FEV6/FVC RATIO: 100 %
Post FEV6/FVC ratio: 100 %
Pre FEV1/FVC ratio: 81 %
RV % PRED: 93 %
RV: 2.26 L
TLC % pred: 66 %
TLC: 4.8 L

## 2016-10-16 LAB — URINALYSIS, ROUTINE W REFLEX MICROSCOPIC
BILIRUBIN URINE: NEGATIVE
GLUCOSE, UA: NEGATIVE mg/dL
HGB URINE DIPSTICK: NEGATIVE
KETONES UR: NEGATIVE mg/dL
Leukocytes, UA: NEGATIVE
Nitrite: NEGATIVE
PROTEIN: NEGATIVE mg/dL
Specific Gravity, Urine: 1.005 (ref 1.005–1.030)
pH: 6 (ref 5.0–8.0)

## 2016-10-16 LAB — PROTIME-INR
INR: 1.18
Prothrombin Time: 15.1 seconds (ref 11.4–15.2)

## 2016-10-16 LAB — BLOOD GAS, ARTERIAL
ACID-BASE EXCESS: 2.1 mmol/L — AB (ref 0.0–2.0)
Bicarbonate: 26.3 mmol/L (ref 20.0–28.0)
DRAWN BY: 421801
FIO2: 21
O2 SAT: 87.6 %
PATIENT TEMPERATURE: 98.6
PCO2 ART: 41.9 mmHg (ref 32.0–48.0)
pH, Arterial: 7.414 (ref 7.350–7.450)
pO2, Arterial: 56.6 mmHg — ABNORMAL LOW (ref 83.0–108.0)

## 2016-10-16 LAB — SURGICAL PCR SCREEN
MRSA, PCR: NEGATIVE
STAPHYLOCOCCUS AUREUS: NEGATIVE

## 2016-10-16 LAB — APTT: APTT: 37 s — AB (ref 24–36)

## 2016-10-16 LAB — ABO/RH: ABO/RH(D): A NEG

## 2016-10-16 MED ORDER — ALBUTEROL SULFATE (2.5 MG/3ML) 0.083% IN NEBU
2.5000 mg | INHALATION_SOLUTION | Freq: Once | RESPIRATORY_TRACT | Status: AC
  Administered 2016-10-16: 2.5 mg via RESPIRATORY_TRACT

## 2016-10-16 MED ORDER — IOPAMIDOL (ISOVUE-370) INJECTION 76%
75.0000 mL | Freq: Once | INTRAVENOUS | Status: AC | PRN
  Administered 2016-10-16: 75 mL via INTRAVENOUS

## 2016-10-16 NOTE — Patient Instructions (Signed)
  Stop taking aspirin between now and the time of surgery  Continue taking all other medications without change through the day before surgery.  Have nothing to eat or drink after midnight the night before surgery.  On the morning of surgery take only carvedilol (Coreg) and hydralazine with a sip of water.

## 2016-10-16 NOTE — Progress Notes (Signed)
[address]       [place] [address]             [phone]     CARDIOTHORACIC SURGERY OFFICE NOTE  Referring Provider is Martinique, Peter M, MD PCP is Pcp Not In System   HPI:  Patient returns to the office today for follow-up of stage D severe symptomatic mitral regurgitation. He was originally seen in consultation on 09/19/2016. He underwent routine blood work, CT angiography and pulmonary function testing earlier today and he returns to our office today for follow-up. He reports no significant changes over the last few weeks. He reports stable symptoms of exertional shortness of breath with low-level activity consistent with chronic combined systolic and diastolic congestive heart failure, New York Heart Association functional class III. Symptoms have not gotten any worse over the past few weeks. He does not have any chest pain or chest tightness. He denies any fevers, chills, or productive cough.   Current Outpatient Prescriptions  Medication Sig Dispense Refill  . aspirin EC 81 MG tablet Take 1 tablet (81 mg total) by mouth daily. 30 tablet 11  . carvedilol (COREG) 6.25 MG tablet Take 1 tablet (6.25 mg total) by mouth 2 (two) times daily with a meal. 60 tablet 6  . folic acid (FOLVITE) 1 MG tablet Take 1 tablet (1 mg total) by mouth daily. 30 tablet 11  . furosemide (LASIX) 40 MG tablet Take 1 tablet (40 mg total) by mouth daily. 30 tablet 6  . hydrALAZINE (APRESOLINE) 10 MG tablet Take 1 tablet (10 mg total) by mouth every 8 (eight) hours. (Patient taking differently: Take 10 mg by mouth every 8 (eight) hours as needed. ) 90 tablet 6  . isosorbide mononitrate (IMDUR) 30 MG 24 hr tablet Take 1 tablet (30 mg total) by mouth daily. 30 tablet 11  . Multiple Vitamin (MULTIVITAMIN) tablet Take 1 tablet by mouth daily.    Marland Kitchen thiamine (VITAMIN B-1) 100 MG tablet Take 1 tablet (100 mg total) by mouth daily. 30 tablet 11   No current facility-administered medications  for this visit.       Physical Exam:   BP 106/75   Pulse 94   Resp 16   Ht 5\' 11"  (1.803 m)   Wt 155 lb (70.3 kg)   SpO2 98% Comment: ON RA  BMI 21.62 kg/m   General:  Well-appearing  Chest:   Clear to auscultation with symmetrical breath sounds  CV:   Regular rate and rhythm with prominent systolic murmur  Incisions:  n/a  Abdomen:  Soft nontender  Extremities:  Warm and well-perfused, no lower extremity edema  Diagnostic Tests:  CT ANGIOGRAPHY CHEST, ABDOMEN AND PELVIS  TECHNIQUE: Multidetector CT imaging through the chest, abdomen and pelvis was performed using the standard protocol during bolus administration of intravenous contrast. Multiplanar reconstructed images and MIPs were obtained and reviewed to evaluate the vascular anatomy.  CONTRAST:  75 mL of Isovue 370.  COMPARISON:  No priors.  FINDINGS: CTA CHEST FINDINGS  Cardiovascular: Heart size is moderately enlarged with right ventricular, right atrial and left atrial dilatation. Small amount of pericardial fluid and/or thickening, unlikely to be of any hemodynamic significance at this time. No associated pericardial calcification. Atherosclerosis of the thoracic aorta (mild), without evidence of aneurysm or dissection. However, there is ectasia of ascending thoracic aorta (4.2 cm in diameter). No atherosclerotic calcifications are identified in the coronary arteries. Dilatation of the pulmonic trunk (3.9 cm in  diameter).  Mediastinum/Nodes: Several borderline enlarged and mildly enlarged mediastinal lymph nodes are noted, largest of which measures 12 mm in short axis in the low left paratracheal nodal station. Esophagus is unremarkable in appearance. No axillary lymphadenopathy.  Lungs/Pleura: Trace bilateral pleural effusions (right greater than left). Widespread interlobular septal thickening, suggestive of a background of mild interstitial pulmonary edema. No confluent consolidative airspace  disease. No pleural effusions. Mild diffuse bronchial wall thickening with mild centrilobular and paraseptal emphysema. No definite suspicious appearing pulmonary nodules or masses.  Musculoskeletal: There are no aggressive appearing lytic or blastic lesions noted in the visualized portions of the skeleton.  Review of the MIP images confirms the above findings.  CTA ABDOMEN AND PELVIS FINDINGS  VASCULAR  Aorta: Normal caliber aorta without aneurysm, dissection, vasculitis or significant stenosis.  Celiac: Patent without evidence of aneurysm, dissection, vasculitis or significant stenosis.  SMA: Patent without evidence of aneurysm, dissection, vasculitis or significant stenosis.  Renals: Both renal arteries are patent without evidence of aneurysm, dissection, vasculitis, fibromuscular dysplasia or significant stenosis.  IMA: Patent without evidence of aneurysm, dissection, vasculitis or significant stenosis.  Inflow: Patent without evidence of aneurysm, dissection, vasculitis or significant stenosis.  Veins: No obvious venous abnormality within the limitations of this arterial phase study.  Review of the MIP images confirms the above findings.  NON-VASCULAR  Hepatobiliary: No suspicious cystic or solid hepatic lesions. No intra or extrahepatic biliary ductal dilatation. Several small calcified gallstones lie dependently in the gallbladder, measuring up to 7 mm. No finding to suggest an acute cholecystitis at this time.  Pancreas: No pancreatic mass. No pancreatic ductal dilatation. No pancreatic or peripancreatic fluid or inflammatory changes.  Spleen: Unremarkable.  Adrenals/Urinary Tract: 11 mm intermediate attenuation (26 HU) lesion in the interpolar region of the left kidney likely represents a small mildly proteinaceous cyst. 2 cm low-attenuation lesion in the interpolar region of the right kidney is compatible with a simple cyst. Bilateral  adrenal glands are normal in appearance. No hydroureteronephrosis. The left side of the urinary bladder extends into the neck of a small left inguinal hernia, but the urinary bladder is otherwise unremarkable in appearance.  Stomach/Bowel: Normal appearance of the stomach. No pathologic dilatation of small bowel or colon. The appendix is not confidently identified and may be surgically absent. Regardless, there are no inflammatory changes noted adjacent to the cecum to suggest the presence of an acute appendicitis at this time.  Lymphatic: No lymphadenopathy noted in the abdomen or pelvis. Multiple prominent borderline enlarged retroperitoneal lymph nodes are nonspecific.  Reproductive: Brachytherapy implants throughout the prostate gland. Seminal vesicles are unremarkable in appearance.  Other: No significant volume of ascites.  No pneumoperitoneum.  Musculoskeletal: There are no aggressive appearing lytic or blastic lesions noted in the visualized portions of the skeleton.  Review of the MIP images confirms the above findings.  IMPRESSION: 1. Cardiomegaly with dilatation of the right ventricle, right atrium and left atrium. Evidence of mild interstitial pulmonary edema in the lungs and trace bilateral pleural effusions. Findings suggest mild congestive heart failure. 2. Several borderline enlarged and mildly enlarged mediastinal lymph nodes. In the setting of apparent congestive heart failure, this is considered highly nonspecific. No lymphadenopathy is noted elsewhere in the chest, abdomen or pelvis (although there are borderline enlarged retroperitoneal lymph nodes, which are also nonspecific). Unless there are clinical findings to suggest lymphoproliferative disorder, this is considered benign at this time. 3. 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 Recommend annual imaging followup  by CTA or MRA. This recommendation follows 2010  ACCF/AHA/AATS/ACR/ASA/SCA/SCAI/SIR/STS/SVM Guidelines for the Diagnosis and Management of Patients with Thoracic Aortic Disease. Circulation. 2010; 121: [id number]. 4. Although there is atherosclerosis throughout the abdominal and pelvic vasculature, this is nonocclusive. 5. Cholelithiasis without evidence of acute cholecystitis at this time. 6. Dilatation of the pulmonic trunk (3.9 cm in diameter), which may suggest underlying pulmonary arterial hypertension. 7. Additional incidental findings, as above.   Electronically Signed   By: Vinnie Langton M.D.   On: 10/16/2016 15:21    Pulmonary Function Tests  Baseline      Post-bronchodilator  FVC  2.61 L  (64% predicted) FVC  2.43 L  (59% predicted) FEV1  2.11 L  (67% predicted) FEV1  1.96 L  (62% predicted) FEF25-75 2.28 L  (82% predicted) FEF25-75 1.70 L  (61% predicted)  TLC  4.80 L  (66% predicted) RV  2.26 L  (93% predicted) DLCO  41% predicted      Impression:  Patient has stage D severe symptomatic mitral regurgitation. 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 Although symptoms of congestive heart failure have improved, he continues to experience exertional shortness of breath with low level activity consistent with chronic combined systolic and diastolic congestive heart failure, New York Heart Association functional class III. I have personally reviewed the patient's transthoracic and transesophageal echocardiograms, diagnostic cardiac catheterization, CT angiogram and pulmonary function tests. Left ventricular function is probably at least mildly globally reduced. Ejection fraction has been estimated 50-55% in  the setting of severe mitral regurgitation. The left ventricle was mildly dilated. There are no obvious wall motion abnormalities. Functional anatomy of the patient's mitral regurgitation remains a bit unclear. There were very limited images obtained at the time of the patient's recent TEE. What images are available reveal fairly normal leaflet mobility with a broad central jet of mitral regurgitation. 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 The subvalvular apparatus was not image thoroughly on TEE but similar images suggest the possibility of an abnormality of the papillary muscles, possibly even congenital. Diagnostic cardiac catheterization is notable for the absence of significant coronary artery disease but the presence of moderate to severe pulmonary hypertension. The patient clearly needs surgery and I remain hopeful that his mitral valve will be repairable. He may need concomitant tricuspid valve repair. He appears to be a reasonably good candidate for minimally invasive approach for surgery and pulmonary function testing reveals findings consistent with only mild underlying airway obstruction due to COPD.  Routine blood work revealed that he does have significant hypoxemia on baseline arterial blood gas and his creatinine remains stable at 1.5.  He received intravenous contrast for his CT scan earlier today.    Plan:  I have again reviewed the indications, risks, and potential benefits of surgery with the patient and his nephew in the office today.  Expectations for his postoperative convalescence at been discussed. For a variety of reasons we have decided to postpone his surgery until 10/25/2016. Although the primary reason for this is related to complications with the operating room schedule, the delay will give the  patient some additional time to  make sure that his renal function has returned to baseline given the fact that his CT angiograms could not be performed before this morning.  The patient understands and accepts all potential risks of surgery including but not limited to risk of death, stroke or other neurologic complication, myocardial infarction, congestive heart failure, respiratory failure, renal failure, bleeding requiring transfusion and/or reexploration, arrhythmia, infection or other wound complications, pneumonia, pleural and/or pericardial effusion, pulmonary embolus, aortic dissection or other major vascular complication, or delayed complications related to valve repair or replacement including but not limited to structural valve deterioration and failure, thrombosis, embolization, endocarditis, or paravalvular leak. Alternative surgical approaches have been discussed including a comparison between conventional sternotomy and minimally-invasive techniques. Specific risks potentially related to the minimally-invasive approach were discussed at length, including but not limited to risk of conversion to full or partial sternotomy, aortic dissection or other major vascular complication, unilateral acute lung injury or pulmonary edema, phrenic nerve dysfunction or paralysis, rib fracture, chronic pain, lung hernia, or lymphocele.  All of their questions have been answered.    I spent in excess of 15 minutes during the conduct of this office consultation and >50% of this time involved direct face-to-face encounter with the patient for counseling and/or coordination of their care.   Valentina Gu. Roxy Manns, MD 10/16/2016 4:59 PM

## 2016-10-16 NOTE — Pre-Procedure Instructions (Signed)
    Aaron Bennett  10/16/2016     Your procedure is scheduled on Wednesday, February 21.  Report to [facility] Admitting at 6:30 AM               Your surgery or procedure is scheduled for 8:30 AM   Call this number if you have problems the morning of surgery: [phone]                 For any other questions, please call [phone], Monday - Friday 8 AM - 4 PM.     Remember:  Do not eat food or drink liquids after midnight Tuesday, February 20.  Take these medicines the morning of surgery with A SIP OF WATER :carvedilol (COREG), hydrALAZINE (APRESOLINE), isosorbide mononitrate (IMDUR).                 1 Week prior to surgery STOP taking, Aspirin Products (Goody Powder, Excedrin Migraine), Ibuprofen (Advil), Naproxen (Aleve), Vitiamins and Herbal Products (ie Fish Oil).   Do not wear jewelry, make-up or nail polish.  Do not wear lotions, powders, or perfumes, or deodorant.  Men may shave face and neck.  Do not bring valuables to the hospital.  [facility] is not responsible for any belongings or valuables.  Contacts, dentures or bridgework may not be worn into surgery.  Leave your suitcase in the car.  After surgery it may be brought to your room.  For patients admitted to the hospital, discharge time will be determined by your treatment team.  Special instructions: Review  [facility] - Preparing For Surgery.  Please read over the following fact sheets that you were given:  [facility]- Preparing For Surgery and Patient Instructions for Mupirocin Application, Incentive Spirometry, Pain Booklet

## 2016-10-16 NOTE — Progress Notes (Signed)
Pre-op Cardiac Surgery  Carotid Findings:  1-39% ICA plaquing. Vertebral artery flow is antegrade.   Upper Extremity Right Left  Brachial Pressures 100T 95T  Radial Waveforms T T  Ulnar Waveforms T T  Palmar Arch (Allen's Test) Doppler signal remains normal with radial compression and obliterates with ulnar compression. Doppler signal remains normal with radial compression and obliterates with ulnar compression.   Findings:      Lower  Extremity Right Left  Dorsalis Pedis    Anterior Tibial    Posterior Tibial    Ankle/Brachial Indices      Findings:

## 2016-10-16 NOTE — Progress Notes (Signed)
Aaron Bennett denies chest pain, does have shortness of breath with activity. Aaron Bennett's blood pressure was 95/72, heart rate 88.  I asked  Patient if Dr Roxy Manns said anything about taking the blood pressure medications the morning of surgery, patient said no, but he will be going to the office a this afternoon. I instructed patient to ask Dr Roxy Manns if he should take blood pressure medication the morning of surgery. Patient denies lightlessness or dizziness.  I called and left a message with Aaron Bennett  At Dr Priscella Mann office to check about blood pressure medications,

## 2016-10-17 LAB — HEMOGLOBIN A1C
HEMOGLOBIN A1C: 5.5 % (ref 4.8–5.6)
Mean Plasma Glucose: 111 mg/dL

## 2016-10-22 NOTE — H&P (Signed)
[address]       [place] [address]             [phone]          CARDIOTHORACIC SURGERY HISTORY AND PHYSICAL EXAM  Referring Provider is Martinique, Peter M, MD PCP is Pcp Not In System      Chief Complaint  Patient presents with  . Mitral Regurgitation    ECHO 04/27/16, CATH/TEE 09/06/16    HPI:  Patient is a 66 year old African-American male with history of mitral regurgitation, tricuspid regurgitation, chronic combined systolic and diastolic congestive heart failure, RBBB, hypertension, stage III chronic kidney disease, hyperlipidemia, previous heavy alcohol abuse and tobacco abuse, and prostate cancer who has been referred for surgical consultation to discuss treatment options for management of severe symptomatic mitral regurgitation. The patient reportedly has a long history of heavy alcohol and tobacco abuse. He began to feel poorly more than a year ago and made efforts to cut back and eventually quit drinking and smoking completely. He successfully stopped drinking and smoking last July. At that time he presented with symptoms of class IV congestive heart failure and a brief syncopal episode that was attributed to possible alcohol withdrawal seizure. He was hospitalized at [facility] and started on medical therapy for congestive heart failure. Transthoracic echocardiogram performed at that time revealed severe mitral regurgitation, moderate tricuspid regurgitation and left ventricular ejection fraction estimated 50-55%. Symptoms improved and the patient has continued to abstain from any tobacco or alcohol use. He has been seen in follow-up on several occasions at [facility] and has continued to complain of symptoms of exertional shortness of breath despite remaining compliant with optimal medical therapy for congestive heart failure.  He was seen in follow-up recently and scheduled for transesophageal echocardiogram and diagnostic  cardiac catheterization.  TEE performed 09/06/2016 confirmed the presence of severe mitral regurgitation, mild aortic insufficiency, and mild to moderate tricuspid regurgitation. Left ventricular ejection fraction was reported 50-55%. Left and right heart catheterization performed 09/06/2016 was notable for the absence of significant coronary artery disease. The patient had moderate to severe pulmonary hypertension with large V waves on wedge tracing consistent with severe mitral regurgitation. The patient was referred for surgical consultation.  The patient has been retired for approximately 6 years, having previously worked as a Horticulturist, commercial. He admits to long-standing heavy alcohol and tobacco abuse although he has been completely abstinent since July 2017. He admits that he does use E cigarettes but he has not smoked any cigarettes since last summer. He has not had any alcohol. At the time he presented last July he complained of symptoms consistent with acute on chronic combined systolic and diastolic congestive heart failure, functional class IV. At present the patient describes stable symptoms of exertional shortness of breath that occur with low-level activity. He denies resting shortness of breath, PND, orthopnea, palpitations, dizzy spells, or syncope. He does have some mild lower extremity edema. He denies any exertional chest pain or chest tightness. He previously lived in [place], but he moved to [place] where he now lives with his sister where he has much improved family and social support. He reports that his physical activity is limited only by exertional shortness of breath.  Patient returns to the office today for follow-up of stage D severe symptomatic mitral regurgitation. He was originally seen in consultation on 09/19/2016. He underwent routine blood work, CT angiography and pulmonary function testing earlier today and he returns  to our office today for follow-up. He reports no  significant changes over the last few weeks. He reports stable symptoms of exertional shortness of breath with low-level activity consistent with chronic combined systolic and diastolic congestive heart failure, New York Heart Association functional class III. Symptoms have not gotten any worse over the past few weeks. He does not have any chest pain or chest tightness. He denies any fevers, chills, or productive cough.   Past Medical History:  Diagnosis Date  . CHF (congestive heart failure) ([place])    a. 04/2016: echo with EF of 50-55%, previously reported 30-35% by outside records.  . Chronic combined systolic and diastolic congestive heart failure ([place])   . CKD (chronic kidney disease) stage 3, GFR 30-59 ml/min   . H/O ETOH abuse   . Hypertension   . Mitral regurgitation    a. severe by echo in 04/2016  . Prostate cancer [facility])     Past Surgical History:  Procedure Laterality Date  . APPENDECTOMY    . CARDIAC CATHETERIZATION N/A 09/06/2016   Procedure: Right/Left Heart Cath and Coronary Angiography;  Surgeon: Peter M Martinique, MD;  Location: [facility];  Service: Cardiovascular;  Laterality: N/A;  . PROSTATE SURGERY    . TEE WITHOUT CARDIOVERSION N/A 09/06/2016   Procedure: TRANSESOPHAGEAL ECHOCARDIOGRAM (TEE);  Surgeon: Thayer Headings, MD;  Location: [facility] ENDOSCOPY;  Service: Cardiovascular;  Laterality: N/A;    Family History  Problem Relation Age of Onset  . Lung disease Mother   . Stroke Father 4  . Yves Dill Parkinson White syndrome Brother     Social History Social History  Substance Use Topics  . Smoking status: Former Smoker    Packs/day: 1.00    Years: 50.00    Quit date: 04/16/2016  . Smokeless tobacco: Never Used  . Alcohol use No     Comment: no alcohol since 03/2016    Prior to Admission medications   Medication Sig Start Date End Date Taking? Authorizing Provider  aspirin EC 81 MG tablet Take 1 tablet (81 mg total) by mouth daily. 06/14/16  Yes Almyra Deforest, PA   carvedilol (COREG) 6.25 MG tablet Take 1 tablet (6.25 mg total) by mouth 2 (two) times daily with a meal. 05/05/16  Yes Erlene Quan, PA-C  folic acid (FOLVITE) 1 MG tablet Take 1 tablet (1 mg total) by mouth daily. 06/14/16  Yes Almyra Deforest, PA  furosemide (LASIX) 40 MG tablet Take 1 tablet (40 mg total) by mouth daily. 04/28/16  Yes Erma Heritage, PA  hydrALAZINE (APRESOLINE) 10 MG tablet Take 1 tablet (10 mg total) by mouth every 8 (eight) hours. Patient taking differently: Take 10 mg by mouth every 8 (eight) hours as needed.  04/28/16  Yes Erma Heritage, PA  isosorbide mononitrate (IMDUR) 30 MG 24 hr tablet Take 1 tablet (30 mg total) by mouth daily. 06/14/16  Yes Almyra Deforest, PA  Multiple Vitamin (MULTIVITAMIN) tablet Take 1 tablet by mouth daily.   Yes Historical Provider, MD  thiamine (VITAMIN B-1) 100 MG tablet Take 1 tablet (100 mg total) by mouth daily. 06/14/16  Yes Almyra Deforest, PA    No Known Allergies   Review of Systems:              General:                      normal appetite, decreased energy, no weight gain, no weight loss, no fever  Cardiac:                       no chest pain with exertion, no chest pain at rest, + SOB with mild exertion, no resting SOB, no PND, no orthopnea, no palpitations, no arrhythmia, no atrial fibrillation, mild bilateral LE edema R>L, no dizzy spells, no syncope             Respiratory:                 + shortness of breath, no home oxygen, no productive cough, occasional dry cough, no bronchitis, no wheezing, no hemoptysis, no asthma, no pain with inspiration or cough, no sleep apnea, no CPAP at night             GI:                               no difficulty swallowing, no reflux, no frequent heartburn, no hiatal hernia, no abdominal pain, no constipation, no diarrhea, no hematochezia, no hematemesis, no melena             GU:                              no dysuria,  no frequency, no urinary tract infection, no hematuria, no enlarged  prostate, no kidney stones, + kidney disease             Vascular:                     no pain suggestive of claudication, no pain in feet, no leg cramps, no varicose veins, no DVT, no non-healing foot ulcer             Neuro:                         no stroke, no TIA's, + seizures, no headaches, no temporary blindness one eye,  no slurred speech, no peripheral neuropathy, no chronic pain, no instability of gait, no memory/cognitive dysfunction             Musculoskeletal:         no arthritis, no joint swelling, no myalgias, no difficulty walking, normal mobility              Skin:                            no rash, no itching, no skin infections, no pressure sores or ulcerations             Psych:                         no anxiety, no depression, no nervousness, no unusual recent stress             Eyes:                           no blurry vision, no floaters, no recent vision changes, does not wear glasses or contacts             ENT:  no hearing loss, edentulous, no dentures, last saw dentist many years ago             Hematologic:               no easy bruising, no abnormal bleeding, no clotting disorder, no frequent epistaxis             Endocrine:                   no diabetes, does not check CBG's at home                           Physical Exam:              BP 96/73 (BP Location: Left Arm, Patient Position: Sitting, Cuff Size: Large)   Pulse 81   Resp 16   Ht 5\' 11"  (1.803 m)   Wt 155 lb (70.3 kg)   SpO2 99% Comment: ON RA  BMI 21.62 kg/m              General:                      Thin,  well-appearing             HEENT:                       Unremarkable              Neck:                           no JVD, no bruits, no adenopathy              Chest:                          clear to auscultation, symmetrical breath sounds, no wheezes, no rhonchi              CV:                              RRR, grade IV/VI holosystolic murmur              Abdomen:                     soft, non-tender, no masses              Extremities:                 warm, well-perfused, pulses diminished, no LE edema             Rectal/GU                   Deferred             Neuro:                         Grossly non-focal and symmetrical throughout             Skin:                            Clean and dry, no rashes, no breakdown   Diagnostic Tests:  Transthoracic Echocardiography  Patient: Dorrion, Gandhi  MR #: [id number] Study Date: 04/27/2016 Gender: M Age: 37 Height: 180.3 cm Weight: 64.7 kg BSA: 1.79 m^2 Pt. Status: Room: 3E20C  ADMITTING Peter Martinique, M.D. ATTENDING Peter Martinique, M.D. PERFORMING [facility], Inpatient ORDERING Bhagat, Bhavinkumar REFERRING Leanor Kail SONOGRAPHER Mikki Santee  cc:  ------------------------------------------------------------------- LV EF: 50% - 55%  ------------------------------------------------------------------- Indications: CHF - 428.0.  ------------------------------------------------------------------- History: Risk factors: History of alcohol abuse. Hypertension.  ------------------------------------------------------------------- Study Conclusions  - Left ventricle: The cavity size was normal. There was moderate focal basal and moderate concentric hypertrophy of the left ventricle. Systolic function was normal. The estimated ejection fraction was in the range of 50% to 55%. Wall motion was normal; there were no regional wall motion abnormalities. - Ventricular septum: The contour showed diastolic flattening and systolic flattening. - Aortic valve: There was moderate regurgitation. - Mitral valve: There was severe regurgitation directed centrally and posteriorly. Valve area by continuity equation (using LVOT flow): 1.11 cm^2. - Left atrium: The atrium was moderately dilated. - Right  ventricle: The cavity size was moderately dilated. Wall thickness was normal. - Right atrium: The atrium was severely dilated. - Tricuspid valve: There was moderate regurgitation. - Pulmonary arteries: Systolic pressure was severely increased. PA peak pressure: 71 mm Hg (S).  Impressions:  - Low normal LVEF, however with severe MR most probably overestimated. Mitral leaflets are thickened and mildly calcified. There is severe mitral regurgitation (RV = 45ml, ERO 50 mm2). Moderate aortic regurgitation. Moderately dilated RV with normal RVEF. Severe pulmonary hypertension.  ------------------------------------------------------------------- Study data: No prior study was available for comparison. Study status: Routine. Procedure: The patient reported no pain pre or post test. Transthoracic echocardiography. Image quality was adequate. Study completion: There were no complications. Transthoracic echocardiography. M-mode, complete 2D, spectral Doppler, and color Doppler. Birthdate: Patient birthdate: 10-Jul-1951. Age: Patient is 66 yr old. Sex: Gender: male. BMI: 19.9 kg/m^2. Blood pressure: 110/72 Patient status: Inpatient. Study date: Study date: 04/27/2016. Study time: 01:08 PM. Location: Echo laboratory.  -------------------------------------------------------------------  ------------------------------------------------------------------- Left ventricle: The cavity size was normal. There was moderate focal basal and moderate concentric hypertrophy of the left ventricle. Systolic function was normal. The estimated ejection fraction was in the range of 50% to 55%. Wall motion was normal; there were no regional wall motion abnormalities.  ------------------------------------------------------------------- Aortic valve: Trileaflet; normal thickness leaflets. Mobility was not restricted. Doppler: Transvalvular velocity was within  the normal range. There was no stenosis. There was moderate regurgitation.  ------------------------------------------------------------------- Aorta: Aortic root: The aortic root was normal in size.  ------------------------------------------------------------------- Mitral valve: Structurally normal valve. Mobility was not restricted. Doppler: Transvalvular velocity was within the normal range. There was no evidence for stenosis. There was severe regurgitation directed centrally and posteriorly. Valve area by continuity equation (using LVOT flow): 1.11 cm^2. Indexed valve area by continuity equation (using LVOT flow): 0.62 cm^2/m^2. Mean gradient (D): 5 mm Hg. Peak gradient (D): 9 mm Hg.  ------------------------------------------------------------------- Left atrium: The atrium was moderately dilated.  ------------------------------------------------------------------- Right ventricle: The cavity size was moderately dilated. Wall thickness was normal. Systolic function was normal.  ------------------------------------------------------------------- Ventricular septum: The contour showed diastolic flattening and systolic flattening.  ------------------------------------------------------------------- Pulmonic valve: Structurally normal valve. Cusp separation was normal. Doppler: Transvalvular velocity was within the normal range. There was no evidence for stenosis. There was mild regurgitation.  ------------------------------------------------------------------- Tricuspid valve: Structurally normal valve. Doppler: Transvalvular velocity was within the normal range. There was moderate regurgitation.  ------------------------------------------------------------------- Pulmonary artery: The main pulmonary artery was normal-sized. Systolic pressure was severely  increased.  -------------------------------------------------------------------  Right atrium: The atrium was severely dilated.  ------------------------------------------------------------------- Pericardium: There was no pericardial effusion.  ------------------------------------------------------------------- Systemic veins: Inferior vena cava: The vessel was normal in size.  ------------------------------------------------------------------- Measurements  Left ventricle Value Reference LV ID, ED, PLAX chordal (H) 56.1 mm 43 - 52 LV ID, ES, PLAX chordal (H) 46.8 mm 23 - 38 LV fx shortening, PLAX chordal (L) 17 % >=29 LV PW thickness, ED 10.4 mm --------- IVS/LV PW ratio, ED (H) 1.49 <=1.3 Stroke volume, 2D 45 ml --------- Stroke volume/bsa, 2D 25 ml/m^2 --------- LV ejection fraction, 1-p A4C 40 % --------- LV end-diastolic volume, 2-p 123XX123 ml --------- LV end-systolic volume, 2-p 123456 ml --------- LV ejection fraction, 2-p 41 % --------- Stroke volume, 2-p 73 ml --------- LV end-diastolic volume/bsa, 2-p 99 ml/m^2 --------- LV end-systolic volume/bsa, 2-p 58 ml/m^2 --------- Stroke volume/bsa, 2-p 40.8 ml/m^2 ---------  Ventricular septum Value Reference IVS thickness, ED 15.5 mm ---------  LVOT Value Reference LVOT ID, S 21 mm --------- LVOT area 3.46 cm^2 --------- LVOT peak velocity, S  64.3 cm/s --------- LVOT mean velocity, S 42.5 cm/s --------- LVOT VTI, S 12.9 cm ---------  Aortic valve Value Reference Aortic regurg pressure half-time 298 ms ---------  Aorta Value Reference Aortic root ID, ED 39 mm ---------  Left atrium Value Reference LA ID, A-P, ES 45 mm --------- LA ID/bsa, A-P (H) 2.51 cm/m^2 <=2.2 LA volume, ES, 1-p A4C 111 ml --------- LA volume/bsa, ES, 1-p A4C 62 ml/m^2 ---------  Mitral valve Value Reference Mitral E-wave peak velocity 151 cm/s --------- Mitral mean velocity, D 103 cm/s --------- Mitral deceleration time (H) 310 ms 150 - 230 Mitral mean gradient, D 5 mm Hg --------- Mitral peak gradient, D 9 mm Hg --------- Mitral valve area, LVOT 1.11 cm^2 --------- continuity Mitral valve area/bsa, LVOT 0.62 cm^2/m^2 --------- continuity Mitral annulus VTI, D 40.1 cm --------- Mitral regurg VTI, PISA 153 cm --------- Mitral ERO, PISA 0.24 cm^2 --------- Mitral regurg volume, PISA 37 ml ---------  Pulmonary arteries Value Reference PA pressure, S, DP (H) 71 mm Hg <=30  Tricuspid valve Value Reference Tricuspid regurg peak velocity 375 cm/s  --------- Tricuspid peak RV-RA gradient 56 mm Hg ---------  Systemic veins Value Reference Estimated CVP 15 mm Hg ---------  Right ventricle Value Reference TAPSE 13.6 mm --------- RV pressure, S, DP (H) 71 mm Hg <=30 RV s&', lateral, S 11.7 cm/s ---------  Pulmonic valve Value Reference Pulmonic regurg velocity, ED 168 cm/s --------- Pulmonic regurg gradient, ED 11 mm Hg ---------  Legend: (L) and (H) mark values outside specified reference range.  ------------------------------------------------------------------- Prepared and Electronically Authenticated by  Ena Dawley, M.D. 2017-08-31T19:34:29   Transesophageal Echocardiography  Patient: Epimenio, Mittag MR #: [id number] Study Date: 09/06/2016 Gender: M Age: 28 Height: 154.9 cm Weight: 71.8 kg BSA: 1.78 m^2 Pt. Status: Room:  ADMITTING Peter Martinique, M.D. ATTENDING Peter Martinique, M.D. PERFORMING [name], M.D. ORDERING Barrett, Rhonda G REFERRING Barrett, Evelene Croon SONOGRAPHER Mikki Santee  cc:  ------------------------------------------------------------------- LV EF: 50% - 55%  ------------------------------------------------------------------- Indications: MVD [non-rheumatic] 424.0.  ------------------------------------------------------------------- History: PMH: Congestive heart failure. Risk factors: History of alcohol abuse. Hypertension.  ------------------------------------------------------------------- Study Conclusions  - Left ventricle: Systolic function was normal. The estimated ejection  fraction was in the range of 50% to 55%. - Aortic valve: There was mild regurgitation. - Mitral valve: There was severe regurgitation. - Left atrium: No evidence of thrombus in the atrial cavity or appendage. - Right ventricle: The cavity size was moderately dilated. Systolic function  was mildly to moderately reduced. - Tricuspid valve: There was mild-moderate regurgitation.  ------------------------------------------------------------------- Study data: Study status: Routine. Consent: The risks, benefits, and alternatives to the procedure were explained to the patient and informed consent was obtained. Procedure: The patient reported no pain pre or post test. Initial setup. The patient was brought to the laboratory. Surface ECG leads were monitored. Sedation. Conscious sedation was administered by cardiology staff. Transesophageal echocardiography. Topical anesthesia was obtained using viscous lidocaine. A transesophageal probe was inserted by the attending cardiologist. Image quality was adequate. Study completion: The patient tolerated the procedure well. There were no complications. Administered medications: Fentanyl, 163mcg. Midazolam, 5mg . Diagnostic transesophageal echocardiography. 2D and color Doppler. Birthdate: Patient birthdate: 10-Jan-1951. Age: Patient is 66 yr old. Sex: Gender: male. BMI: 29.9 kg/m^2. Blood pressure: 86/68 Patient status: Inpatient. Study date: Study date: 09/06/2016. Study time: 09:05 AM. Location: Endoscopy.  -------------------------------------------------------------------  ------------------------------------------------------------------- Left ventricle: Systolic function was normal. The estimated ejection fraction was in the range of 50% to 55%.  ------------------------------------------------------------------- Aortic valve: Doppler: There was mild  regurgitation.  ------------------------------------------------------------------- Aorta: The aorta was mildly calcified.  ------------------------------------------------------------------- Mitral valve: The MR is central . no evidence of a flail leaflet. no vegetation There is reversal of flow in the pulmonary veins. Doppler: There was severe regurgitation.  ------------------------------------------------------------------- Left atrium: No evidence of thrombus in the atrial cavity or appendage.  ------------------------------------------------------------------- Right ventricle: The cavity size was moderately dilated. Systolic function was mildly to moderately reduced.  ------------------------------------------------------------------- Tricuspid valve: The valve appears to be grossly normal. Doppler: There was mild-moderate regurgitation.  ------------------------------------------------------------------- Post procedure conclusions Ascending Aorta:  - The aorta was mildly calcified.  ------------------------------------------------------------------- Prepared and Electronically Authenticated by  [name], M.D. 2018-01-10T18:03:20  Right/Left Heart Cath and Coronary Angiography  Conclusion     Mid RCA lesion, 20 %stenosed.  Hemodynamic findings consistent with moderate pulmonary hypertension.  LV end diastolic pressure is normal.  1. No significant CAD 2. Moderate to severe pulmonary HTN 3. Elevated PCWP with prominent V waves c/w severe MR. 4. Reduced cardiac output/index.   Plan: consider patient for MVR versus repair.   Indications   Severe mitral insufficiency [I34.0 (ICD-10-CM)]  Procedural Details/Technique   Technical Details Indication: 66 yo BM with severe mitral insufficiency   Procedural Details: The left brachial vein was cannalized but due to acute angulation of the left subclavian vein the Swan catheter  could not be advanced. The right groin was prepped, draped, and anesthetized with 1% lidocaine. Using the modified Seldinger technique a 5 Fr sheath was placed in the right femoral artery and a 7 French sheath was placed in the right femoral vein. A Swan-Ganz catheter was used for the right heart catheterization. Standard protocol was followed for recording of right heart pressures and sampling of oxygen saturations. Fick and thermodilution cardiac output was calculated. Standard Judkins catheters were used for selective coronary angiography and left ventricular pressures. There were no immediate procedural complications. The patient was transferred to the post catheterization recovery area for further monitoring.  Contrast: 40 cc   Estimated blood loss <50 mL.  During this procedure no sedation was administered.    Complications   Complications documented before study signed (09/06/2016 12:45 PM EST)    No complications were associated with this study.  Documented by Peter M Martinique, MD - 09/06/2016 12:17 PM EST    Coronary Findings   Dominance: Right  Left Main  Vessel was injected. Vessel is normal in caliber. Vessel is angiographically normal.  Left  Anterior Descending  Vessel was injected. Vessel is normal in caliber. Vessel is angiographically normal.  Left Circumflex  Vessel was injected. Vessel is normal in caliber. Vessel is angiographically normal.  Right Coronary Artery  Mid RCA lesion, 20% stenosed.  Right Heart   Right Heart Pressures Hemodynamic findings consistent with moderate pulmonary hypertension. LV EDP is normal.    Coronary Diagrams   Diagnostic Diagram     Implants        No implant documentation for this case.  PACS Images   Show images for Cardiac catheterization   Link to Procedure Log   Procedure Log    Hemo Data   Flowsheet Row Most Recent Value  Fick Cardiac Output 3.04 L/min  Fick Cardiac Output Index 1.59 (L/min)/BSA  Thermal  Cardiac Output 2.67 L/min  Thermal Cardiac Output Index 1.4 (L/min)/BSA  RA A Wave 8 mmHg  RA V Wave 9 mmHg  RA Mean 7 mmHg  RV Systolic Pressure 60 mmHg  RV Diastolic Pressure 2 mmHg  RV EDP 11 mmHg  PA Systolic Pressure 62 mmHg  PA Diastolic Pressure 23 mmHg  PA Mean 39 mmHg  PW A Wave 27 mmHg  PW V Wave 35 mmHg  PW Mean 26 mmHg  AO Systolic Pressure 85 mmHg  AO Diastolic Pressure 66 mmHg  AO Mean 76 mmHg  LV Systolic Pressure [id number] mmHg  LV Diastolic Pressure 4 mmHg  LV EDP 12 mmHg  Arterial Occlusion Pressure Extended Systolic Pressure 91 mmHg  Arterial Occlusion Pressure Extended Diastolic Pressure 63 mmHg  Arterial Occlusion Pressure Extended Mean Pressure 76 mmHg  Left Ventricular Apex Extended Systolic Pressure 94 mmHg  Left Ventricular Apex Extended Diastolic Pressure 2 mmHg  Left Ventricular Apex Extended EDP Pressure 10 mmHg  QP/QS 1  TPVR Index 27.66 HRUI  TSVR Index 47.77 HRUI  PVR SVR Ratio 0.28  TPVR/TSVR Ratio 0.58    CT ANGIOGRAPHY CHEST, ABDOMEN AND PELVIS  TECHNIQUE: Multidetector CT imaging through the chest, abdomen and pelvis was performed using the standard protocol during bolus administration of intravenous contrast. Multiplanar reconstructed images and MIPs were obtained and reviewed to evaluate the vascular anatomy.  CONTRAST: 75 mL of Isovue 370.  COMPARISON: No priors.  FINDINGS: CTA CHEST FINDINGS  Cardiovascular: Heart size is moderately enlarged with right ventricular, right atrial and left atrial dilatation. Small amount of pericardial fluid and/or thickening, unlikely to be of any hemodynamic significance at this time. No associated pericardial calcification. Atherosclerosis of the thoracic aorta (mild), without evidence of aneurysm or dissection. However, there is ectasia of ascending thoracic aorta (4.2 cm in diameter). No atherosclerotic calcifications are identified in the coronary arteries. Dilatation of the pulmonic  trunk (3.9 cm in diameter).  Mediastinum/Nodes: Several borderline enlarged and mildly enlarged mediastinal lymph nodes are noted, largest of which measures 12 mm in short axis in the low left paratracheal nodal station. Esophagus is unremarkable in appearance. No axillary lymphadenopathy.  Lungs/Pleura: Trace bilateral pleural effusions (right greater than left). Widespread interlobular septal thickening, suggestive of a background of mild interstitial pulmonary edema. No confluent consolidative airspace disease. No pleural effusions. Mild diffuse bronchial wall thickening with mild centrilobular and paraseptal emphysema. No definite suspicious appearing pulmonary nodules or masses.  Musculoskeletal: There are no aggressive appearing lytic or blastic lesions noted in the visualized portions of the skeleton.  Review of the MIP images confirms the above findings.  CTA ABDOMEN AND PELVIS FINDINGS  VASCULAR  Aorta: Normal caliber aorta without aneurysm, dissection, vasculitis or  significant stenosis.  Celiac: Patent without evidence of aneurysm, dissection, vasculitis or significant stenosis.  SMA: Patent without evidence of aneurysm, dissection, vasculitis or significant stenosis.  Renals: Both renal arteries are patent without evidence of aneurysm, dissection, vasculitis, fibromuscular dysplasia or significant stenosis.  IMA: Patent without evidence of aneurysm, dissection, vasculitis or significant stenosis.  Inflow: Patent without evidence of aneurysm, dissection, vasculitis or significant stenosis.  Veins: No obvious venous abnormality within the limitations of this arterial phase study.  Review of the MIP images confirms the above findings.  NON-VASCULAR  Hepatobiliary: No suspicious cystic or solid hepatic lesions. No intra or extrahepatic biliary ductal dilatation. Several small calcified gallstones lie dependently in the gallbladder,  measuring up to 7 mm. No finding to suggest an acute cholecystitis at this time.  Pancreas: No pancreatic mass. No pancreatic ductal dilatation. No pancreatic or peripancreatic fluid or inflammatory changes.  Spleen: Unremarkable.  Adrenals/Urinary Tract: 11 mm intermediate attenuation (26 HU) lesion in the interpolar region of the left kidney likely represents a small mildly proteinaceous cyst. 2 cm low-attenuation lesion in the interpolar region of the right kidney is compatible with a simple cyst. Bilateral adrenal glands are normal in appearance. No hydroureteronephrosis. The left side of the urinary bladder extends into the neck of a small left inguinal hernia, but the urinary bladder is otherwise unremarkable in appearance.  Stomach/Bowel: Normal appearance of the stomach. No pathologic dilatation of small bowel or colon. The appendix is not confidently identified and may be surgically absent. Regardless, there are no inflammatory changes noted adjacent to the cecum to suggest the presence of an acute appendicitis at this time.  Lymphatic: No lymphadenopathy noted in the abdomen or pelvis. Multiple prominent borderline enlarged retroperitoneal lymph nodes are nonspecific.  Reproductive: Brachytherapy implants throughout the prostate gland. Seminal vesicles are unremarkable in appearance.  Other: No significant volume of ascites. No pneumoperitoneum.  Musculoskeletal: There are no aggressive appearing lytic or blastic lesions noted in the visualized portions of the skeleton.  Review of the MIP images confirms the above findings.  IMPRESSION: 1. Cardiomegaly with dilatation of the right ventricle, right atrium and left atrium. Evidence of mild interstitial pulmonary edema in the lungs and trace bilateral pleural effusions. Findings suggest mild congestive heart failure. 2. Several borderline enlarged and mildly enlarged mediastinal lymph nodes. In the  setting of apparent congestive heart failure, this is considered highly nonspecific. No lymphadenopathy is noted elsewhere in the chest, abdomen or pelvis (although there are borderline enlarged retroperitoneal lymph nodes, which are also nonspecific). Unless there are clinical findings to suggest lymphoproliferative disorder, this is considered benign at this time. 3. 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 Recommend annual imaging followup by CTA or MRA. This recommendation follows 2010 ACCF/AHA/AATS/ACR/ASA/SCA/SCAI/SIR/STS/SVM Guidelines for the Diagnosis and Management of Patients with Thoracic Aortic Disease. Circulation. 2010; 121: [id number]. 4. Although there is atherosclerosis throughout the abdominal and pelvic vasculature, this is nonocclusive. 5. Cholelithiasis without evidence of acute cholecystitis at this time. 6. Dilatation of the pulmonic trunk (3.9 cm in diameter), which may suggest underlying pulmonary arterial hypertension. 7. Additional incidental findings, as above.   Electronically Signed By: Vinnie Langton M.D. On: 10/16/2016 15:21    Pulmonary Function Tests  Baseline  Post-bronchodilator  FVC                 2.61 L  (64% predicted)          FVC                 2.43 L  (59% predicted) FEV1               2.11 L  (67% predicted)          FEV1               1.96 L  (62% predicted) FEF25-75        2.28 L  (82% predicted)          FEF25-75        1.70 L  (61% predicted)  TLC                 4.80 L  (66% predicted) RV                   2.26 L  (93% predicted) DLCO              41% predicted      Impression:  Patient has stage D severe symptomatic mitral regurgitation. 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 Although symptoms of congestive heart failure have improved, he continues to experience exertional shortness of breath with low level activity consistent with chronic combined systolic and diastolic congestive heart failure, [place] Heart Association functional class III. I have personally reviewed the patient's transthoracic and transesophageal echocardiograms, diagnostic cardiac catheterization, CT angiogram and pulmonary function tests. Left ventricular function is probably at least mildly globally reduced. Ejection fraction has been estimated 50-55% in the setting of severe mitral regurgitation. The left ventricle was mildly dilated. There are no obvious wall motion abnormalities. Functional anatomy of the patient's mitral regurgitation remains a bit unclear. There were very limited images obtained at the time of the patient's recent TEE. What images are available reveal fairly normal leaflet mobility with a broad central jet of mitral regurgitation. 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 The subvalvular apparatus was not image thoroughly on TEE but similar images suggest the possibility of an abnormality of the papillary muscles, possibly even congenital. Diagnostic cardiac catheterization is notable for the absence of significant coronary artery disease but the presence of moderate to severe pulmonary hypertension. The patient clearly needs surgery and I remain hopeful that his mitral valve will be repairable. He may need concomitant tricuspid valve repair. He appears to be a reasonably good candidate for minimally invasive approach for surgery and pulmonary function testing reveals  findings consistent with only mild underlying airway obstruction due to COPD.  Routine blood work revealed that he does have significant hypoxemia on baseline arterial blood gas and his creatinine remains stable at 1.5.  He received intravenous contrast for his CT scan earlier today.    Plan:  I have again reviewed the indications, risks, and potential benefits of surgery with the patient and his nephew in the office today.  Expectations for  his postoperative convalescence at been discussed. For a variety of reasons we have decided to postpone his surgery until 10/25/2016. Although the primary reason for this is related to complications with the operating room schedule, the delay will give the patient some additional time to make sure that his renal function has returned to baseline given the fact that his CT angiograms could not be performed before this morning.  The patient understands and accepts all potential risks of surgery including but not limited to risk of death, stroke or other neurologic complication, myocardial infarction, congestive heart failure, respiratory failure, renal failure, bleeding requiring transfusion and/or reexploration, arrhythmia, infection or other wound complications, pneumonia, pleural and/or pericardial effusion, pulmonary embolus, aortic dissection or other major vascular complication, or delayed complications related to valve repair or replacement including but not limited to structural valve deterioration and failure, thrombosis, embolization, endocarditis, or paravalvular leak. Alternative surgical approaches have been discussed including a comparison between conventional sternotomy and minimally-invasive techniques. Specific risks potentially related to the minimally-invasive approach were discussed at length, including but not limited to risk of conversion to full or partial sternotomy, aortic dissection or other major vascular complication, unilateral acute lung  injury or pulmonary edema, phrenic nerve dysfunction or paralysis, rib fracture, chronic pain, lung hernia, or lymphocele. All of their questions have been answered.    I spent in excess of 15 minutes during the conduct of this office consultation and >50% of this time involved direct face-to-face encounter with the patient for counseling and/or coordination of their care.   Valentina Gu. Roxy Manns, MD 10/16/2016 4:59 PM

## 2016-10-24 MED ORDER — GLUTARALDEHYDE 0.625% SOAKING SOLUTION
TOPICAL | Status: DC | PRN
  Administered 2016-10-25: 1 via TOPICAL
  Filled 2016-10-24 (×2): qty 50

## 2016-10-24 MED ORDER — TRANEXAMIC ACID (OHS) PUMP PRIME SOLUTION
2.0000 mg/kg | INTRAVENOUS | Status: DC
  Filled 2016-10-24: qty 1.41

## 2016-10-24 MED ORDER — DEXMEDETOMIDINE HCL IN NACL 400 MCG/100ML IV SOLN
0.1000 ug/kg/h | INTRAVENOUS | Status: AC
  Administered 2016-10-25: .5 ug/kg/h via INTRAVENOUS
  Filled 2016-10-24: qty 100

## 2016-10-24 MED ORDER — NITROGLYCERIN IN D5W 200-5 MCG/ML-% IV SOLN
2.0000 ug/min | INTRAVENOUS | Status: DC
  Filled 2016-10-24: qty 250

## 2016-10-24 MED ORDER — VANCOMYCIN HCL 1000 MG IV SOLR
INTRAVENOUS | Status: AC
  Administered 2016-10-25: 1000 mL
  Filled 2016-10-24: qty 1000

## 2016-10-24 MED ORDER — SODIUM CHLORIDE 0.9 % IV SOLN
INTRAVENOUS | Status: AC
  Administered 2016-10-25: 1 [IU]/h via INTRAVENOUS
  Filled 2016-10-24: qty 2.5

## 2016-10-24 MED ORDER — SODIUM CHLORIDE 0.9 % IV SOLN
30.0000 ug/min | INTRAVENOUS | Status: AC
  Administered 2016-10-25: 25 ug/min via INTRAVENOUS
  Filled 2016-10-24: qty 2

## 2016-10-24 MED ORDER — SODIUM CHLORIDE 0.9 % IV SOLN
INTRAVENOUS | Status: DC
  Filled 2016-10-24: qty 30

## 2016-10-24 MED ORDER — EPINEPHRINE PF 1 MG/ML IJ SOLN
0.0000 ug/min | INTRAVENOUS | Status: AC
  Administered 2016-10-25: 3 ug/min via INTRAVENOUS
  Filled 2016-10-24: qty 4

## 2016-10-24 MED ORDER — MAGNESIUM SULFATE 50 % IJ SOLN
40.0000 meq | INTRAMUSCULAR | Status: DC
  Filled 2016-10-24: qty 10

## 2016-10-24 MED ORDER — DOPAMINE-DEXTROSE 3.2-5 MG/ML-% IV SOLN
0.0000 ug/kg/min | INTRAVENOUS | Status: DC
  Filled 2016-10-24: qty 250

## 2016-10-24 MED ORDER — VANCOMYCIN HCL 10 G IV SOLR
1250.0000 mg | INTRAVENOUS | Status: AC
  Administered 2016-10-25: 1250 mg via INTRAVENOUS
  Filled 2016-10-24: qty 1250

## 2016-10-24 MED ORDER — CHLORHEXIDINE GLUCONATE 0.12 % MT SOLN
15.0000 mL | Freq: Once | OROMUCOSAL | Status: AC
  Administered 2016-10-25: 15 mL via OROMUCOSAL
  Filled 2016-10-24: qty 15

## 2016-10-24 MED ORDER — DEXTROSE 5 % IV SOLN
750.0000 mg | INTRAVENOUS | Status: DC
  Filled 2016-10-24: qty 750

## 2016-10-24 MED ORDER — POTASSIUM CHLORIDE 2 MEQ/ML IV SOLN
80.0000 meq | INTRAVENOUS | Status: DC
  Filled 2016-10-24: qty 40

## 2016-10-24 MED ORDER — TRANEXAMIC ACID (OHS) BOLUS VIA INFUSION
15.0000 mg/kg | INTRAVENOUS | Status: AC
  Administered 2016-10-25: 1054.5 mg via INTRAVENOUS
  Filled 2016-10-24: qty 1055

## 2016-10-24 MED ORDER — METOPROLOL TARTRATE 12.5 MG HALF TABLET: 12.5000 mg | ORAL_TABLET | Freq: Once | ORAL | Status: DC

## 2016-10-24 MED ORDER — PLASMA-LYTE 148 IV SOLN
INTRAVENOUS | Status: AC
  Administered 2016-10-25: 500 mL
  Filled 2016-10-24: qty 2.5

## 2016-10-24 MED ORDER — TRANEXAMIC ACID 1000 MG/10ML IV SOLN
1.5000 mg/kg/h | INTRAVENOUS | Status: AC
  Administered 2016-10-25: 1.5 mg/kg/h via INTRAVENOUS
  Filled 2016-10-24: qty 25

## 2016-10-24 MED ORDER — CEFUROXIME SODIUM 1.5 G IJ SOLR
1.5000 g | INTRAMUSCULAR | Status: AC
  Administered 2016-10-25: 1.5 g via INTRAVENOUS
  Administered 2016-10-25: .75 g via INTRAVENOUS
  Filled 2016-10-24: qty 1.5

## 2016-10-24 NOTE — Progress Notes (Addendum)
Patient previously preadmitted on 10/16/2016. Will need repeat lab work per orders. Patient told to follow printed instructions received at previous preadmission appointment. Patient verbalized understanding and read verbatim the printed instructions he had. Confirmed with OR that surgery time does not need to be moved up.

## 2016-10-24 NOTE — Progress Notes (Signed)
Anesthesia Chart Review: Patient is a 66 year old male scheduled for minimally invasive mitral valve repair or replacement, possible minimally invasive tricuspid valve repair on 10/25/16 by Dr. Roxy Manns. Procedure was initially scheduled for 10/18/16, but was postponed due to changes with OR scheduling and to allow time for renal function to return to baseline following CTA. Per [facility] RN Thurmond Butts, he wants the CBC, BMET, and T&S repeated on the day of surgery.    History includes severe mitral regurgitation, chronic combined systolic and diastolic CHF, pulmonary hypertension, prostate cancer s/p surgery, HTN, CKD stage III, appendectomy, former smoker (quit 04/16/16), ETOH abuse (sober since 03/2016).  Cardiologist is Dr. Martinique.   EKG 10/16/16: NSR, right BBB.   TEE 09/06/16: Study Conclusions - Left ventricle: Systolic function was normal. The estimated   ejection fraction was in the range of 50% to 55%. - Aortic valve: There was mild regurgitation. - Mitral valve: There was severe regurgitation. - Left atrium: No evidence of thrombus in the atrial cavity or   appendage. - Right ventricle: The cavity size was moderately dilated. Systolic   function was mildly to moderately reduced. - Tricuspid valve: There was mild-moderate regurgitation.  Cardiac cath 09/06/16:  Mid RCA lesion, 20 %stenosed.  Hemodynamic findings consistent with moderate pulmonary hypertension.  LV end diastolic pressure is normal. 1. No significant CAD 2. Moderate to severe pulmonary HTN 3. Elevated PCWP with prominent V waves c/w severe MR. 4. Reduced cardiac output/index.  Plan: consider patient for MVR versus repair  Carotid U/S 10/16/16: Summary: Bilateral: intimal wall thickening CCA. 1-39% ICA plaquing. Vertebral artery flow is antegrade.  CTA Chest/abd/pelvis 10/16/16: IMPRESSION: 1. Cardiomegaly with dilatation of the right ventricle, right atrium and left atrium. Evidence of mild interstitial pulmonary edema in the  lungs and trace bilateral pleural effusions. Findings suggest mild congestive heart failure. 2. Several borderline enlarged and mildly enlarged mediastinal lymph nodes. In the setting of apparent congestive heart failure, this is considered highly nonspecific. No lymphadenopathy is noted elsewhere in the chest, abdomen or pelvis (although there are borderline enlarged retroperitoneal lymph nodes, which are also nonspecific). Unless there are clinical findings to suggest lymphoproliferative disorder, this is considered benign at this time. 3. 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 Recommend annual imaging followup by CTA or MRA. This recommendation follows 2010 ACCF/AHA/AATS/ACR/ASA/SCA/SCAI/SIR/STS/SVM Guidelines for the Diagnosis and Management of Patients with Thoracic Aortic Disease. Circulation. 2010; 121: [id number]. 4. Although there is atherosclerosis throughout the abdominal and pelvic vasculature, this is nonocclusive. 5. Cholelithiasis without evidence of acute cholecystitis at this time. 6. Dilatation of the pulmonic trunk (3.9 cm in diameter), which may suggest underlying pulmonary arterial hypertension. 7. Additional incidental findings, as above.  PFTs 10/16/16: FVC 2.61 (64%), FEV1 2.11 (67%), DLCOunc 13.85 (41%).  Repeat labs as ordered on the day of surgery. Anesthesiologist to evaluate on the day of surgery to discuss anesthesia plan.  [facility] Short Stay Center/Anesthesiology Phone [phone] 10/24/2016 6:26 PM

## 2016-10-25 ENCOUNTER — Encounter (HOSPITAL_COMMUNITY)
Admission: RE | Disposition: A | Discharge status: Home / Self Care | Payer: Self-pay | Source: Ambulatory Visit | Attending: Thoracic Surgery (Cardiothoracic Vascular Surgery)

## 2016-10-25 ENCOUNTER — Inpatient Hospital Stay (HOSPITAL_COMMUNITY): Payer: Medicare HMO

## 2016-10-25 ENCOUNTER — Inpatient Hospital Stay (HOSPITAL_COMMUNITY): Payer: Medicare HMO | Admitting: Vascular Surgery

## 2016-10-25 ENCOUNTER — Encounter (HOSPITAL_COMMUNITY): Payer: Self-pay | Admitting: *Deleted

## 2016-10-25 ENCOUNTER — Inpatient Hospital Stay (HOSPITAL_COMMUNITY)
Admission: RE | Admit: 2016-10-25 | Discharge: 2016-11-04 | DRG: 219 | Disposition: A | Discharge status: Home / Self Care | Payer: Medicare HMO | Source: Ambulatory Visit | Attending: Thoracic Surgery (Cardiothoracic Vascular Surgery) | Admitting: Thoracic Surgery (Cardiothoracic Vascular Surgery)

## 2016-10-25 DIAGNOSIS — I4430 Unspecified atrioventricular block: Secondary | ICD-10-CM | POA: Diagnosis not present

## 2016-10-25 DIAGNOSIS — I5043 Acute on chronic combined systolic (congestive) and diastolic (congestive) heart failure: Secondary | ICD-10-CM | POA: Diagnosis present

## 2016-10-25 DIAGNOSIS — Z79899 Other long term (current) drug therapy: Secondary | ICD-10-CM | POA: Diagnosis not present

## 2016-10-25 DIAGNOSIS — I361 Nonrheumatic tricuspid (valve) insufficiency: Secondary | ICD-10-CM

## 2016-10-25 DIAGNOSIS — I509 Heart failure, unspecified: Secondary | ICD-10-CM | POA: Diagnosis not present

## 2016-10-25 DIAGNOSIS — F17290 Nicotine dependence, other tobacco product, uncomplicated: Secondary | ICD-10-CM | POA: Diagnosis present

## 2016-10-25 DIAGNOSIS — I130 Hypertensive heart and chronic kidney disease with heart failure and stage 1 through stage 4 chronic kidney disease, or unspecified chronic kidney disease: Secondary | ICD-10-CM | POA: Diagnosis present

## 2016-10-25 DIAGNOSIS — E785 Hyperlipidemia, unspecified: Secondary | ICD-10-CM | POA: Diagnosis present

## 2016-10-25 DIAGNOSIS — D500 Iron deficiency anemia secondary to blood loss (chronic): Secondary | ICD-10-CM | POA: Diagnosis present

## 2016-10-25 DIAGNOSIS — Z8546 Personal history of malignant neoplasm of prostate: Secondary | ICD-10-CM | POA: Diagnosis not present

## 2016-10-25 DIAGNOSIS — I4510 Unspecified right bundle-branch block: Secondary | ICD-10-CM | POA: Diagnosis present

## 2016-10-25 DIAGNOSIS — Z9889 Other specified postprocedural states: Secondary | ICD-10-CM

## 2016-10-25 DIAGNOSIS — N179 Acute kidney failure, unspecified: Secondary | ICD-10-CM | POA: Diagnosis not present

## 2016-10-25 DIAGNOSIS — I340 Nonrheumatic mitral (valve) insufficiency: Secondary | ICD-10-CM | POA: Diagnosis present

## 2016-10-25 DIAGNOSIS — I4892 Unspecified atrial flutter: Secondary | ICD-10-CM | POA: Diagnosis not present

## 2016-10-25 DIAGNOSIS — I071 Rheumatic tricuspid insufficiency: Secondary | ICD-10-CM | POA: Diagnosis present

## 2016-10-25 DIAGNOSIS — D62 Acute posthemorrhagic anemia: Secondary | ICD-10-CM | POA: Diagnosis not present

## 2016-10-25 DIAGNOSIS — M109 Gout, unspecified: Secondary | ICD-10-CM | POA: Diagnosis not present

## 2016-10-25 DIAGNOSIS — T797XXA Traumatic subcutaneous emphysema, initial encounter: Secondary | ICD-10-CM | POA: Diagnosis not present

## 2016-10-25 DIAGNOSIS — N170 Acute kidney failure with tubular necrosis: Secondary | ICD-10-CM | POA: Diagnosis present

## 2016-10-25 DIAGNOSIS — N189 Chronic kidney disease, unspecified: Secondary | ICD-10-CM | POA: Diagnosis not present

## 2016-10-25 DIAGNOSIS — Z6829 Body mass index (BMI) 29.0-29.9, adult: Secondary | ICD-10-CM | POA: Diagnosis not present

## 2016-10-25 DIAGNOSIS — I083 Combined rheumatic disorders of mitral, aortic and tricuspid valves: Principal | ICD-10-CM | POA: Diagnosis present

## 2016-10-25 DIAGNOSIS — I4891 Unspecified atrial fibrillation: Secondary | ICD-10-CM | POA: Diagnosis not present

## 2016-10-25 DIAGNOSIS — I442 Atrioventricular block, complete: Secondary | ICD-10-CM | POA: Diagnosis not present

## 2016-10-25 DIAGNOSIS — J9811 Atelectasis: Secondary | ICD-10-CM

## 2016-10-25 DIAGNOSIS — J939 Pneumothorax, unspecified: Secondary | ICD-10-CM

## 2016-10-25 DIAGNOSIS — D696 Thrombocytopenia, unspecified: Secondary | ICD-10-CM | POA: Diagnosis not present

## 2016-10-25 DIAGNOSIS — I272 Pulmonary hypertension, unspecified: Secondary | ICD-10-CM | POA: Diagnosis present

## 2016-10-25 DIAGNOSIS — I5042 Chronic combined systolic (congestive) and diastolic (congestive) heart failure: Secondary | ICD-10-CM | POA: Diagnosis present

## 2016-10-25 DIAGNOSIS — Z952 Presence of prosthetic heart valve: Secondary | ICD-10-CM

## 2016-10-25 DIAGNOSIS — N183 Chronic kidney disease, stage 3 unspecified: Secondary | ICD-10-CM | POA: Diagnosis present

## 2016-10-25 DIAGNOSIS — R570 Cardiogenic shock: Secondary | ICD-10-CM | POA: Diagnosis not present

## 2016-10-25 DIAGNOSIS — I483 Typical atrial flutter: Secondary | ICD-10-CM | POA: Diagnosis not present

## 2016-10-25 DIAGNOSIS — Z953 Presence of xenogenic heart valve: Secondary | ICD-10-CM | POA: Diagnosis not present

## 2016-10-25 DIAGNOSIS — D638 Anemia in other chronic diseases classified elsewhere: Secondary | ICD-10-CM | POA: Diagnosis present

## 2016-10-25 DIAGNOSIS — Z950 Presence of cardiac pacemaker: Secondary | ICD-10-CM

## 2016-10-25 HISTORY — PX: MITRAL VALVE REPLACEMENT: SHX147

## 2016-10-25 HISTORY — DX: Presence of xenogenic heart valve: Z95.3

## 2016-10-25 HISTORY — PX: MINIMALLY INVASIVE TRICUSPID VALVE REPAIR: SHX5975

## 2016-10-25 HISTORY — PX: TEE WITHOUT CARDIOVERSION: SHX5443

## 2016-10-25 LAB — POCT I-STAT, CHEM 8
BUN: 22 mg/dL — AB (ref 6–20)
BUN: 22 mg/dL — ABNORMAL HIGH (ref 6–20)
BUN: 21 mg/dL — AB (ref 6–20)
BUN: 22 mg/dL — ABNORMAL HIGH (ref 6–20)
BUN: 22 mg/dL — AB (ref 6–20)
BUN: 25 mg/dL — ABNORMAL HIGH (ref 6–20)
BUN: 23 mg/dL — AB (ref 6–20)
CALCIUM ION: 1.04 mmol/L — AB (ref 1.15–1.40)
CALCIUM ION: 1.04 mmol/L — AB (ref 1.15–1.40)
CALCIUM ION: 1.12 mmol/L — AB (ref 1.15–1.40)
CALCIUM ION: 1.1 mmol/L — AB (ref 1.15–1.40)
CHLORIDE: 106 mmol/L (ref 101–111)
CHLORIDE: 107 mmol/L (ref 101–111)
CHLORIDE: 103 mmol/L (ref 101–111)
CHLORIDE: 101 mmol/L (ref 101–111)
CHLORIDE: 102 mmol/L (ref 101–111)
CHLORIDE: 104 mmol/L (ref 101–111)
CREATININE: 1.2 mg/dL (ref 0.61–1.24)
CREATININE: 1.3 mg/dL — AB (ref 0.61–1.24)
CREATININE: 1.2 mg/dL (ref 0.61–1.24)
CREATININE: 1.3 mg/dL — AB (ref 0.61–1.24)
Calcium, Ion: 1.18 mmol/L (ref 1.15–1.40)
Calcium, Ion: 1.2 mmol/L (ref 1.15–1.40)
Calcium, Ion: 1.09 mmol/L — ABNORMAL LOW (ref 1.15–1.40)
Chloride: 102 mmol/L (ref 101–111)
Creatinine, Ser: 1.2 mg/dL (ref 0.61–1.24)
Creatinine, Ser: 1.3 mg/dL — ABNORMAL HIGH (ref 0.61–1.24)
Creatinine, Ser: 1.4 mg/dL — ABNORMAL HIGH (ref 0.61–1.24)
GLUCOSE: 127 mg/dL — AB (ref 65–99)
GLUCOSE: 116 mg/dL — AB (ref 65–99)
GLUCOSE: 117 mg/dL — AB (ref 65–99)
GLUCOSE: 124 mg/dL — AB (ref 65–99)
Glucose, Bld: 111 mg/dL — ABNORMAL HIGH (ref 65–99)
Glucose, Bld: 118 mg/dL — ABNORMAL HIGH (ref 65–99)
Glucose, Bld: 106 mg/dL — ABNORMAL HIGH (ref 65–99)
HCT: 32 % — ABNORMAL LOW (ref 39.0–52.0)
HCT: 27 % — ABNORMAL LOW (ref 39.0–52.0)
HCT: 23 % — ABNORMAL LOW (ref 39.0–52.0)
HCT: 25 % — ABNORMAL LOW (ref 39.0–52.0)
HCT: 25 % — ABNORMAL LOW (ref 39.0–52.0)
HEMATOCRIT: 31 % — AB (ref 39.0–52.0)
HEMATOCRIT: 29 % — AB (ref 39.0–52.0)
HEMOGLOBIN: 8.5 g/dL — AB (ref 13.0–17.0)
HEMOGLOBIN: 9.9 g/dL — AB (ref 13.0–17.0)
Hemoglobin: 10.5 g/dL — ABNORMAL LOW (ref 13.0–17.0)
Hemoglobin: 10.9 g/dL — ABNORMAL LOW (ref 13.0–17.0)
Hemoglobin: 9.2 g/dL — ABNORMAL LOW (ref 13.0–17.0)
Hemoglobin: 7.8 g/dL — ABNORMAL LOW (ref 13.0–17.0)
Hemoglobin: 8.5 g/dL — ABNORMAL LOW (ref 13.0–17.0)
POTASSIUM: 4.1 mmol/L (ref 3.5–5.1)
POTASSIUM: 4.4 mmol/L (ref 3.5–5.1)
POTASSIUM: 4.3 mmol/L (ref 3.5–5.1)
POTASSIUM: 6 mmol/L — AB (ref 3.5–5.1)
Potassium: 6.1 mmol/L — ABNORMAL HIGH (ref 3.5–5.1)
Potassium: 5.7 mmol/L — ABNORMAL HIGH (ref 3.5–5.1)
Potassium: 4.3 mmol/L (ref 3.5–5.1)
SODIUM: 140 mmol/L (ref 135–145)
SODIUM: 136 mmol/L (ref 135–145)
SODIUM: 138 mmol/L (ref 135–145)
SODIUM: 140 mmol/L (ref 135–145)
Sodium: 141 mmol/L (ref 135–145)
Sodium: 142 mmol/L (ref 135–145)
Sodium: 135 mmol/L (ref 135–145)
TCO2: 27 mmol/L (ref 0–100)
TCO2: 26 mmol/L (ref 0–100)
TCO2: 26 mmol/L (ref 0–100)
TCO2: 27 mmol/L (ref 0–100)
TCO2: 27 mmol/L (ref 0–100)
TCO2: 26 mmol/L (ref 0–100)
TCO2: 25 mmol/L (ref 0–100)

## 2016-10-25 LAB — CBC
HCT: 33.8 % — ABNORMAL LOW (ref 39.0–52.0)
HEMATOCRIT: 35.1 % — AB (ref 39.0–52.0)
HEMATOCRIT: 33.8 % — AB (ref 39.0–52.0)
HEMOGLOBIN: 11.3 g/dL — AB (ref 13.0–17.0)
HEMOGLOBIN: 11 g/dL — AB (ref 13.0–17.0)
Hemoglobin: 10.8 g/dL — ABNORMAL LOW (ref 13.0–17.0)
MCH: 32.8 pg (ref 26.0–34.0)
MCH: 32.7 pg (ref 26.0–34.0)
MCH: 32.5 pg (ref 26.0–34.0)
MCHC: 32 g/dL (ref 30.0–36.0)
MCHC: 32.2 g/dL (ref 30.0–36.0)
MCHC: 32.5 g/dL (ref 30.0–36.0)
MCV: 102.7 fL — AB (ref 78.0–100.0)
MCV: 101.4 fL — AB (ref 78.0–100.0)
MCV: 100 fL (ref 78.0–100.0)
PLATELETS: 215 10*3/uL (ref 150–400)
Platelets: 100 10*3/uL — ABNORMAL LOW (ref 150–400)
Platelets: 112 10*3/uL — ABNORMAL LOW (ref 150–400)
RBC: 3.29 MIL/uL — AB (ref 4.22–5.81)
RBC: 3.46 MIL/uL — ABNORMAL LOW (ref 4.22–5.81)
RBC: 3.38 MIL/uL — AB (ref 4.22–5.81)
RDW: 14.6 % (ref 11.5–15.5)
RDW: 14.4 % (ref 11.5–15.5)
RDW: 14.3 % (ref 11.5–15.5)
WBC: 5.3 10*3/uL (ref 4.0–10.5)
WBC: 9.3 10*3/uL (ref 4.0–10.5)
WBC: 8.7 10*3/uL (ref 4.0–10.5)

## 2016-10-25 LAB — HEMOGLOBIN AND HEMATOCRIT, BLOOD
HCT: 23.3 % — ABNORMAL LOW (ref 39.0–52.0)
Hemoglobin: 7.7 g/dL — ABNORMAL LOW (ref 13.0–17.0)

## 2016-10-25 LAB — BASIC METABOLIC PANEL
Anion gap: 10 (ref 5–15)
BUN: 22 mg/dL — ABNORMAL HIGH (ref 6–20)
CHLORIDE: 104 mmol/L (ref 101–111)
CO2: 25 mmol/L (ref 22–32)
CREATININE: 1.47 mg/dL — AB (ref 0.61–1.24)
Calcium: 9.1 mg/dL (ref 8.9–10.3)
GFR calc non Af Amer: 48 mL/min — ABNORMAL LOW (ref >60)
GFR, EST AFRICAN AMERICAN: 56 mL/min — AB (ref >60)
Glucose, Bld: 93 mg/dL (ref 65–99)
Potassium: 3.8 mmol/L (ref 3.5–5.1)
Sodium: 139 mmol/L (ref 135–145)

## 2016-10-25 LAB — POCT I-STAT 3, ART BLOOD GAS (G3+)
ACID-BASE DEFICIT: 3 mmol/L — AB (ref 0.0–2.0)
ACID-BASE DEFICIT: 4 mmol/L — AB (ref 0.0–2.0)
ACID-BASE DEFICIT: 2 mmol/L (ref 0.0–2.0)
Acid-Base Excess: 2 mmol/L (ref 0.0–2.0)
Acid-base deficit: 3 mmol/L — ABNORMAL HIGH (ref 0.0–2.0)
BICARBONATE: 22.4 mmol/L (ref 20.0–28.0)
BICARBONATE: 21.5 mmol/L (ref 20.0–28.0)
BICARBONATE: 22.5 mmol/L (ref 20.0–28.0)
Bicarbonate: 25.9 mmol/L (ref 20.0–28.0)
Bicarbonate: 24 mmol/L (ref 20.0–28.0)
O2 SAT: 100 %
O2 SAT: 100 %
O2 Saturation: 86 %
O2 Saturation: 92 %
O2 Saturation: 99 %
PCO2 ART: 38.1 mmHg (ref 32.0–48.0)
PCO2 ART: 53.7 mmHg — AB (ref 32.0–48.0)
PCO2 ART: 34.6 mmHg (ref 32.0–48.0)
PH ART: 7.395 (ref 7.350–7.450)
PH ART: 7.386 (ref 7.350–7.450)
PO2 ART: 60 mmHg — AB (ref 83.0–108.0)
Patient temperature: 35.4
TCO2: 27 mmol/L (ref 0–100)
TCO2: 26 mmol/L (ref 0–100)
TCO2: 24 mmol/L (ref 0–100)
TCO2: 23 mmol/L (ref 0–100)
TCO2: 24 mmol/L (ref 0–100)
pCO2 arterial: 40.1 mmHg (ref 32.0–48.0)
pCO2 arterial: 37.4 mmHg (ref 32.0–48.0)
pH, Arterial: 7.441 (ref 7.350–7.450)
pH, Arterial: 7.257 — ABNORMAL LOW (ref 7.350–7.450)
pH, Arterial: 7.342 — ABNORMAL LOW (ref 7.350–7.450)
pO2, Arterial: 316 mmHg — ABNORMAL HIGH (ref 83.0–108.0)
pO2, Arterial: 383 mmHg — ABNORMAL HIGH (ref 83.0–108.0)
pO2, Arterial: 48 mmHg — ABNORMAL LOW (ref 83.0–108.0)
pO2, Arterial: 158 mmHg — ABNORMAL HIGH (ref 83.0–108.0)

## 2016-10-25 LAB — POCT I-STAT 4, (NA,K, GLUC, HGB,HCT)
GLUCOSE: 106 mg/dL — AB (ref 65–99)
HEMATOCRIT: 35 % — AB (ref 39.0–52.0)
Hemoglobin: 11.9 g/dL — ABNORMAL LOW (ref 13.0–17.0)
Potassium: 4.1 mmol/L (ref 3.5–5.1)
SODIUM: 140 mmol/L (ref 135–145)

## 2016-10-25 LAB — GLUCOSE, CAPILLARY
GLUCOSE-CAPILLARY: 158 mg/dL — AB (ref 65–99)
GLUCOSE-CAPILLARY: 168 mg/dL — AB (ref 65–99)
GLUCOSE-CAPILLARY: 151 mg/dL — AB (ref 65–99)
Glucose-Capillary: 132 mg/dL — ABNORMAL HIGH (ref 65–99)
Glucose-Capillary: 167 mg/dL — ABNORMAL HIGH (ref 65–99)

## 2016-10-25 LAB — PLATELET COUNT: Platelets: 153 10*3/uL (ref 150–400)

## 2016-10-25 LAB — CREATININE, SERUM
CREATININE: 1.71 mg/dL — AB (ref 0.61–1.24)
GFR calc non Af Amer: 40 mL/min — ABNORMAL LOW (ref >60)
GFR, EST AFRICAN AMERICAN: 47 mL/min — AB (ref >60)

## 2016-10-25 LAB — APTT: aPTT: 47 seconds — ABNORMAL HIGH (ref 24–36)

## 2016-10-25 LAB — PROTIME-INR
INR: 1.55
Prothrombin Time: 18.8 seconds — ABNORMAL HIGH (ref 11.4–15.2)

## 2016-10-25 LAB — MAGNESIUM: MAGNESIUM: 2.6 mg/dL — AB (ref 1.7–2.4)

## 2016-10-25 SURGERY — REPAIR, TRICUSPID VALVE, TRANSCATHETER
Anesthesia: General | Site: Chest | Laterality: Right

## 2016-10-25 MED ORDER — INSULIN REGULAR BOLUS VIA INFUSION
0.0000 [IU] | Freq: Three times a day (TID) | INTRAVENOUS | Status: DC
  Filled 2016-10-25: qty 10

## 2016-10-25 MED ORDER — MIDAZOLAM HCL 5 MG/5ML IJ SOLN
INTRAMUSCULAR | Status: DC | PRN
  Administered 2016-10-25 (×2): 1 mg via INTRAVENOUS
  Administered 2016-10-25: 4 mg via INTRAVENOUS

## 2016-10-25 MED ORDER — SODIUM CHLORIDE 0.9 % IV SOLN: 30.0000 meq | Freq: Once | INTRAVENOUS | Status: DC

## 2016-10-25 MED ORDER — PHENYLEPHRINE HCL 10 MG/ML IJ SOLN
0.0000 ug/min | INTRAMUSCULAR | Status: DC
  Administered 2016-10-26: 50 ug/min via INTRAVENOUS
  Filled 2016-10-25 (×2): qty 4

## 2016-10-25 MED ORDER — NITROGLYCERIN IN D5W 200-5 MCG/ML-% IV SOLN: 0.0000 ug/min | INTRAVENOUS | Status: DC

## 2016-10-25 MED ORDER — ASPIRIN EC 325 MG PO TBEC: 325.0000 mg | DELAYED_RELEASE_TABLET | Freq: Every day | ORAL | Status: DC

## 2016-10-25 MED ORDER — CHLORHEXIDINE GLUCONATE 0.12% ORAL RINSE (MEDLINE KIT)
15.0000 mL | Freq: Two times a day (BID) | OROMUCOSAL | Status: DC
  Administered 2016-10-25 – 2016-10-27 (×4): 15 mL via OROMUCOSAL

## 2016-10-25 MED ORDER — LEVALBUTEROL HCL 0.63 MG/3ML IN NEBU
INHALATION_SOLUTION | RESPIRATORY_TRACT | Status: AC
  Administered 2016-10-25: 17:00:00
  Filled 2016-10-25: qty 3

## 2016-10-25 MED ORDER — HEPARIN SODIUM (PORCINE) 1000 UNIT/ML IJ SOLN
INTRAMUSCULAR | Status: AC
  Filled 2016-10-25: qty 1

## 2016-10-25 MED ORDER — BUPIVACAINE 0.5 % ON-Q PUMP SINGLE CATH 400 ML
INJECTION | Status: DC | PRN
  Administered 2016-10-25: 400 mL

## 2016-10-25 MED ORDER — PHENYLEPHRINE HCL 10 MG/ML IJ SOLN
INTRAMUSCULAR | Status: DC | PRN
  Administered 2016-10-25 (×2): 40 ug via INTRAVENOUS

## 2016-10-25 MED ORDER — MILRINONE LACTATE IN DEXTROSE 20-5 MG/100ML-% IV SOLN
0.3750 ug/kg/min | INTRAVENOUS | Status: AC
  Administered 2016-10-25: .3 ug/kg/min via INTRAVENOUS
  Filled 2016-10-25: qty 100

## 2016-10-25 MED ORDER — FENTANYL CITRATE (PF) 100 MCG/2ML IJ SOLN
INTRAMUSCULAR | Status: DC | PRN
  Administered 2016-10-25: 250 ug via INTRAVENOUS
  Administered 2016-10-25: 400 ug via INTRAVENOUS
  Administered 2016-10-25 (×2): 50 ug via INTRAVENOUS

## 2016-10-25 MED ORDER — PROTAMINE SULFATE 10 MG/ML IV SOLN
INTRAVENOUS | Status: AC
  Filled 2016-10-25: qty 25

## 2016-10-25 MED ORDER — SODIUM CHLORIDE 0.9 % IV SOLN
INTRAVENOUS | Status: DC
  Administered 2016-10-25: 16:00:00 via INTRAVENOUS

## 2016-10-25 MED ORDER — MORPHINE SULFATE (PF) 2 MG/ML IV SOLN
1.0000 mg | INTRAVENOUS | Status: DC | PRN
  Administered 2016-10-26 (×2): 2 mg via INTRAVENOUS

## 2016-10-25 MED ORDER — OXYCODONE HCL 5 MG PO TABS
5.0000 mg | ORAL_TABLET | ORAL | Status: DC | PRN
  Administered 2016-10-26: 5 mg via ORAL
  Administered 2016-10-27 – 2016-11-03 (×4): 10 mg via ORAL
  Filled 2016-10-25: qty 2
  Filled 2016-10-25: qty 1
  Filled 2016-10-25 (×3): qty 2

## 2016-10-25 MED ORDER — CHLORHEXIDINE GLUCONATE 0.12 % MT SOLN
15.0000 mL | OROMUCOSAL | Status: AC
  Administered 2016-10-25: 15 mL via OROMUCOSAL

## 2016-10-25 MED ORDER — BISACODYL 5 MG PO TBEC
10.0000 mg | DELAYED_RELEASE_TABLET | Freq: Every day | ORAL | Status: DC
  Administered 2016-10-26 – 2016-10-28 (×3): 10 mg via ORAL
  Filled 2016-10-25 (×5): qty 2

## 2016-10-25 MED ORDER — LACTATED RINGERS IV SOLN
INTRAVENOUS | Status: DC
  Administered 2016-10-25 (×2): via INTRAVENOUS

## 2016-10-25 MED ORDER — PROTAMINE SULFATE 10 MG/ML IV SOLN
INTRAVENOUS | Status: DC | PRN
  Administered 2016-10-25: 230 mg via INTRAVENOUS
  Administered 2016-10-25: 10 mg via INTRAVENOUS

## 2016-10-25 MED ORDER — AMIODARONE HCL IN DEXTROSE 360-4.14 MG/200ML-% IV SOLN
INTRAVENOUS | Status: AC
  Filled 2016-10-25: qty 200

## 2016-10-25 MED ORDER — LACTATED RINGERS IV SOLN: 500.0000 mL | Freq: Once | INTRAVENOUS | Status: DC | PRN

## 2016-10-25 MED ORDER — MIDAZOLAM HCL 2 MG/2ML IJ SOLN
2.0000 mg | INTRAMUSCULAR | Status: DC | PRN
  Administered 2016-10-26 (×2): 2 mg via INTRAVENOUS
  Filled 2016-10-25 (×2): qty 2

## 2016-10-25 MED ORDER — SODIUM CHLORIDE 0.9% FLUSH
3.0000 mL | Freq: Two times a day (BID) | INTRAVENOUS | Status: DC
  Administered 2016-10-26 – 2016-10-28 (×3): 3 mL via INTRAVENOUS

## 2016-10-25 MED ORDER — CHLORHEXIDINE GLUCONATE 4 % EX LIQD: 30.0000 mL | CUTANEOUS | Status: DC

## 2016-10-25 MED ORDER — DEXTROSE 5 % IV SOLN
1.5000 g | Freq: Two times a day (BID) | INTRAVENOUS | Status: AC
  Administered 2016-10-25 – 2016-10-27 (×4): 1.5 g via INTRAVENOUS
  Filled 2016-10-25 (×4): qty 1.5

## 2016-10-25 MED ORDER — PROPOFOL 10 MG/ML IV BOLUS
INTRAVENOUS | Status: AC
  Filled 2016-10-25: qty 40

## 2016-10-25 MED ORDER — FUROSEMIDE 10 MG/ML IJ SOLN
80.0000 mg | Freq: Once | INTRAMUSCULAR | Status: AC
  Administered 2016-10-25: 80 mg via INTRAVENOUS

## 2016-10-25 MED ORDER — EPINEPHRINE PF 1 MG/ML IJ SOLN
0.0000 ug/min | INTRAVENOUS | Status: DC
  Administered 2016-10-26: 5 ug/min via INTRAVENOUS
  Administered 2016-10-27: 3.5 ug/min via INTRAVENOUS
  Filled 2016-10-25 (×3): qty 4

## 2016-10-25 MED ORDER — DOPAMINE-DEXTROSE 3.2-5 MG/ML-% IV SOLN
0.0000 ug/kg/min | INTRAVENOUS | Status: DC
  Administered 2016-10-25: 3 ug/kg/min via INTRAVENOUS
  Filled 2016-10-25: qty 250

## 2016-10-25 MED ORDER — METHYLPREDNISOLONE SODIUM SUCC 125 MG IJ SOLR
INTRAMUSCULAR | Status: AC
  Administered 2016-10-25: 100 mg via INTRAVENOUS
  Filled 2016-10-25: qty 2

## 2016-10-25 MED ORDER — AMIODARONE HCL IN DEXTROSE 360-4.14 MG/200ML-% IV SOLN
60.0000 mg/h | INTRAVENOUS | Status: DC
  Administered 2016-10-25: 60 mg/h via INTRAVENOUS

## 2016-10-25 MED ORDER — 0.9 % SODIUM CHLORIDE (POUR BTL) OPTIME
TOPICAL | Status: DC | PRN
  Administered 2016-10-25: 5000 mL

## 2016-10-25 MED ORDER — LACTATED RINGERS IV SOLN
INTRAVENOUS | Status: DC
  Administered 2016-10-25 (×2): via INTRAVENOUS

## 2016-10-25 MED ORDER — AMIODARONE HCL IN DEXTROSE 360-4.14 MG/200ML-% IV SOLN: 30.0000 mg/h | INTRAVENOUS | Status: DC

## 2016-10-25 MED ORDER — MORPHINE SULFATE (PF) 2 MG/ML IV SOLN
1.0000 mg | INTRAVENOUS | Status: DC | PRN
  Administered 2016-10-25 – 2016-10-26 (×3): 2 mg via INTRAVENOUS
  Administered 2016-10-26: 4 mg via INTRAVENOUS
  Filled 2016-10-25 (×4): qty 2

## 2016-10-25 MED ORDER — ACETAMINOPHEN 500 MG PO TABS
1000.0000 mg | ORAL_TABLET | Freq: Four times a day (QID) | ORAL | Status: AC
  Administered 2016-10-26 – 2016-10-30 (×14): 1000 mg via ORAL
  Filled 2016-10-25 (×15): qty 2

## 2016-10-25 MED ORDER — IPRATROPIUM-ALBUTEROL 0.5-2.5 (3) MG/3ML IN SOLN
3.0000 mL | RESPIRATORY_TRACT | Status: DC
  Administered 2016-10-25 – 2016-10-27 (×10): 3 mL via RESPIRATORY_TRACT
  Filled 2016-10-25 (×11): qty 3

## 2016-10-25 MED ORDER — ASPIRIN 81 MG PO CHEW
324.0000 mg | CHEWABLE_TABLET | Freq: Every day | ORAL | Status: DC
  Administered 2016-10-26: 324 mg
  Filled 2016-10-25: qty 4

## 2016-10-25 MED ORDER — FAMOTIDINE IN NACL 20-0.9 MG/50ML-% IV SOLN
20.0000 mg | Freq: Two times a day (BID) | INTRAVENOUS | Status: AC
  Administered 2016-10-25 (×2): 20 mg via INTRAVENOUS
  Filled 2016-10-25: qty 50

## 2016-10-25 MED ORDER — BUPIVACAINE 0.5 % ON-Q PUMP SINGLE CATH 400 ML
400.0000 mL | INJECTION | Status: DC
  Filled 2016-10-25: qty 400

## 2016-10-25 MED ORDER — FUROSEMIDE 10 MG/ML IJ SOLN
INTRAMUSCULAR | Status: AC
  Administered 2016-10-25: 80 mg via INTRAVENOUS
  Filled 2016-10-25: qty 8

## 2016-10-25 MED ORDER — AMIODARONE HCL IN DEXTROSE 360-4.14 MG/200ML-% IV SOLN
30.0000 mg/h | INTRAVENOUS | Status: DC
  Administered 2016-10-26 – 2016-10-27 (×2): 30 mg/h via INTRAVENOUS
  Filled 2016-10-25 (×3): qty 200

## 2016-10-25 MED ORDER — LACTATED RINGERS IV SOLN
INTRAVENOUS | Status: DC | PRN
  Administered 2016-10-25: 08:00:00 via INTRAVENOUS

## 2016-10-25 MED ORDER — ACETAMINOPHEN 160 MG/5ML PO SOLN: 650.0000 mg | Freq: Once | ORAL | Status: AC

## 2016-10-25 MED ORDER — ONDANSETRON HCL 4 MG/2ML IJ SOLN: 4.0000 mg | Freq: Four times a day (QID) | INTRAMUSCULAR | Status: DC | PRN

## 2016-10-25 MED ORDER — MIDAZOLAM HCL 10 MG/2ML IJ SOLN
INTRAMUSCULAR | Status: AC
  Filled 2016-10-25: qty 2

## 2016-10-25 MED ORDER — ROCURONIUM BROMIDE 50 MG/5ML IV SOSY
PREFILLED_SYRINGE | INTRAVENOUS | Status: AC
  Filled 2016-10-25: qty 10

## 2016-10-25 MED ORDER — METOPROLOL TARTRATE 12.5 MG HALF TABLET: 12.5000 mg | ORAL_TABLET | Freq: Two times a day (BID) | ORAL | Status: DC

## 2016-10-25 MED ORDER — ALBUMIN HUMAN 5 % IV SOLN
12.5000 g | Freq: Once | INTRAVENOUS | Status: AC
  Administered 2016-10-25: 12.5 g via INTRAVENOUS
  Filled 2016-10-25: qty 250

## 2016-10-25 MED ORDER — PHENYLEPHRINE 40 MCG/ML (10ML) SYRINGE FOR IV PUSH (FOR BLOOD PRESSURE SUPPORT)
PREFILLED_SYRINGE | INTRAVENOUS | Status: AC
  Filled 2016-10-25: qty 10

## 2016-10-25 MED ORDER — LACTATED RINGERS IV SOLN
INTRAVENOUS | Status: DC
  Administered 2016-10-25: 16:00:00 via INTRAVENOUS

## 2016-10-25 MED ORDER — CALCIUM CHLORIDE 10 % IV SOLN
1.0000 g | Freq: Once | INTRAVENOUS | Status: AC
  Administered 2016-10-25: 1 g via INTRAVENOUS

## 2016-10-25 MED ORDER — ROCURONIUM BROMIDE 10 MG/ML (PF) SYRINGE
PREFILLED_SYRINGE | INTRAVENOUS | Status: DC | PRN
  Administered 2016-10-25 (×4): 50 mg via INTRAVENOUS

## 2016-10-25 MED ORDER — SODIUM CHLORIDE 0.9 % IV SOLN
0.0000 ug/min | INTRAVENOUS | Status: DC
  Administered 2016-10-25: 60 ug/min via INTRAVENOUS
  Administered 2016-10-25: 10 ug/min via INTRAVENOUS
  Filled 2016-10-25: qty 2

## 2016-10-25 MED ORDER — SODIUM CHLORIDE 0.9 % IV SOLN
30.0000 meq | Freq: Once | INTRAVENOUS | Status: AC
  Administered 2016-10-25: 30 meq via INTRAVENOUS
  Filled 2016-10-25: qty 15

## 2016-10-25 MED ORDER — METOPROLOL TARTRATE 25 MG/10 ML ORAL SUSPENSION: 12.5000 mg | Freq: Two times a day (BID) | ORAL | Status: DC

## 2016-10-25 MED ORDER — DEXMEDETOMIDINE HCL IN NACL 200 MCG/50ML IV SOLN
0.0000 ug/kg/h | INTRAVENOUS | Status: DC
  Administered 2016-10-25 – 2016-10-26 (×4): 0.7 ug/kg/h via INTRAVENOUS
  Filled 2016-10-25 (×5): qty 50

## 2016-10-25 MED ORDER — ALBUMIN HUMAN 5 % IV SOLN
250.0000 mL | INTRAVENOUS | Status: AC | PRN
  Administered 2016-10-25 (×4): 250 mL via INTRAVENOUS
  Filled 2016-10-25: qty 250

## 2016-10-25 MED ORDER — BUPIVACAINE HCL (PF) 0.5 % IJ SOLN
INTRAMUSCULAR | Status: AC
  Filled 2016-10-25: qty 10

## 2016-10-25 MED ORDER — BISACODYL 10 MG RE SUPP: 10.0000 mg | Freq: Every day | RECTAL | Status: DC

## 2016-10-25 MED ORDER — HEPARIN SODIUM (PORCINE) 1000 UNIT/ML IJ SOLN
INTRAMUSCULAR | Status: DC | PRN
  Administered 2016-10-25: 22 mL via INTRAVENOUS

## 2016-10-25 MED ORDER — SODIUM CHLORIDE 0.9 % IV SOLN
250.0000 mL | INTRAVENOUS | Status: DC
  Administered 2016-10-31: 250 mL via INTRAVENOUS

## 2016-10-25 MED ORDER — SODIUM CHLORIDE 0.45 % IV SOLN
INTRAVENOUS | Status: DC | PRN
  Administered 2016-10-25: 16:00:00 via INTRAVENOUS

## 2016-10-25 MED ORDER — BUPIVACAINE HCL (PF) 0.5 % IJ SOLN
INTRAMUSCULAR | Status: DC | PRN
  Administered 2016-10-25: 5 mL

## 2016-10-25 MED ORDER — FENTANYL CITRATE (PF) 250 MCG/5ML IJ SOLN
INTRAMUSCULAR | Status: AC
  Filled 2016-10-25: qty 5

## 2016-10-25 MED ORDER — DEXTROSE 5 % IV SOLN
100.0000 mg | Freq: Once | INTRAVENOUS | Status: AC
  Administered 2016-10-25: 100 mg via INTRAVENOUS
  Filled 2016-10-25 (×2): qty 10

## 2016-10-25 MED ORDER — ACETAMINOPHEN 160 MG/5ML PO SOLN
1000.0000 mg | Freq: Four times a day (QID) | ORAL | Status: DC
  Administered 2016-10-25 – 2016-10-26 (×3): 1000 mg
  Filled 2016-10-25 (×3): qty 40.6

## 2016-10-25 MED ORDER — TRAMADOL HCL 50 MG PO TABS: 50.0000 mg | ORAL_TABLET | ORAL | Status: DC | PRN

## 2016-10-25 MED ORDER — DOCUSATE SODIUM 100 MG PO CAPS
200.0000 mg | ORAL_CAPSULE | Freq: Every day | ORAL | Status: DC
  Administered 2016-10-26 – 2016-11-02 (×5): 200 mg via ORAL
  Filled 2016-10-25 (×7): qty 2

## 2016-10-25 MED ORDER — ACETAMINOPHEN 650 MG RE SUPP
650.0000 mg | Freq: Once | RECTAL | Status: AC
  Administered 2016-10-25: 650 mg via RECTAL

## 2016-10-25 MED ORDER — MILRINONE LACTATE IN DEXTROSE 20-5 MG/100ML-% IV SOLN
0.1250 ug/kg/min | INTRAVENOUS | Status: DC
  Administered 2016-10-25: 0.5 ug/kg/min via INTRAVENOUS
  Administered 2016-10-26: 0.375 ug/kg/min via INTRAVENOUS
  Administered 2016-10-26: 0.5 ug/kg/min via INTRAVENOUS
  Administered 2016-10-27: 0.3 ug/kg/min via INTRAVENOUS
  Administered 2016-10-27: 0.375 ug/kg/min via INTRAVENOUS
  Administered 2016-10-28 (×2): 0.3 ug/kg/min via INTRAVENOUS
  Administered 2016-10-29: 0.25 ug/kg/min via INTRAVENOUS
  Administered 2016-10-30 – 2016-10-31 (×2): 0.2 ug/kg/min via INTRAVENOUS
  Administered 2016-11-01: 0.125 ug/kg/min via INTRAVENOUS
  Filled 2016-10-25 (×11): qty 100

## 2016-10-25 MED ORDER — SODIUM CHLORIDE 0.9 % IV SOLN
INTRAVENOUS | Status: DC
  Filled 2016-10-25: qty 2.5

## 2016-10-25 MED ORDER — SODIUM CHLORIDE 0.9% FLUSH: 3.0000 mL | INTRAVENOUS | Status: DC | PRN

## 2016-10-25 MED ORDER — METHYLPREDNISOLONE SODIUM SUCC 125 MG IJ SOLR
100.0000 mg | Freq: Once | INTRAMUSCULAR | Status: AC
Start: 2016-10-25 — End: 2016-10-25
  Administered 2016-10-25: 100 mg via INTRAVENOUS

## 2016-10-25 MED ORDER — FENTANYL CITRATE (PF) 250 MCG/5ML IJ SOLN
INTRAMUSCULAR | Status: AC
  Filled 2016-10-25: qty 10

## 2016-10-25 MED ORDER — MAGNESIUM SULFATE 4 GM/100ML IV SOLN
4.0000 g | Freq: Once | INTRAVENOUS | Status: AC
  Administered 2016-10-25: 4 g via INTRAVENOUS
  Filled 2016-10-25: qty 100

## 2016-10-25 MED ORDER — VANCOMYCIN HCL IN DEXTROSE 1-5 GM/200ML-% IV SOLN
1000.0000 mg | Freq: Once | INTRAVENOUS | Status: AC
  Administered 2016-10-25: 1000 mg via INTRAVENOUS
  Filled 2016-10-25: qty 200

## 2016-10-25 MED ORDER — PANTOPRAZOLE SODIUM 40 MG PO TBEC
40.0000 mg | DELAYED_RELEASE_TABLET | Freq: Every day | ORAL | Status: DC
  Administered 2016-10-27 – 2016-11-04 (×9): 40 mg via ORAL
  Filled 2016-10-25 (×9): qty 1

## 2016-10-25 MED ORDER — METOPROLOL TARTRATE 5 MG/5ML IV SOLN
2.5000 mg | INTRAVENOUS | Status: DC | PRN
  Administered 2016-10-26: 2.5 mg via INTRAVENOUS
  Filled 2016-10-25: qty 5

## 2016-10-25 MED ORDER — ORAL CARE MOUTH RINSE
15.0000 mL | OROMUCOSAL | Status: DC
  Administered 2016-10-25 – 2016-10-27 (×13): 15 mL via OROMUCOSAL

## 2016-10-25 MED FILL — Potassium Chloride Inj 2 mEq/ML: INTRAVENOUS | Qty: 40 | Status: AC

## 2016-10-25 MED FILL — Magnesium Sulfate Inj 50%: INTRAMUSCULAR | Qty: 10 | Status: AC

## 2016-10-25 MED FILL — Heparin Sodium (Porcine) Inj 1000 Unit/ML: INTRAMUSCULAR | Qty: 30 | Status: AC

## 2016-10-25 SURGICAL SUPPLY — 126 items
ADAPTER CARDIO PERF ANTE/RETRO (ADAPTER) ×6 IMPLANT
ATTRACTOMAT 16X20 MAGNETIC DRP (DRAPES) IMPLANT
BAG DECANTER FOR FLEXI CONT (MISCELLANEOUS) ×3 IMPLANT
BLADE SURG 11 STRL SS (BLADE) ×6 IMPLANT
CANISTER SUCT 3000ML PPV (MISCELLANEOUS) ×6 IMPLANT
CANNULA FEM VENOUS REMOTE 22FR (CANNULA) ×3 IMPLANT
CANNULA FEMORAL ART 14 SM (MISCELLANEOUS) ×6 IMPLANT
CANNULA GUNDRY RCSP 15FR (MISCELLANEOUS) ×6 IMPLANT
CANNULA OPTISITE PERFUSION 16F (CANNULA) IMPLANT
CANNULA OPTISITE PERFUSION 18F (CANNULA) ×3 IMPLANT
CANNULA SUMP PERICARDIAL (CANNULA) ×12 IMPLANT
CATH KIT ON Q 5IN SLV (PAIN MANAGEMENT) IMPLANT
CATH ROBINSON RED A/P 18FR (CATHETERS) ×6 IMPLANT
CONN ST 1/4X3/8  BEN (MISCELLANEOUS) ×4
CONN ST 1/4X3/8 BEN (MISCELLANEOUS) ×8 IMPLANT
CONNECTOR 1/2X3/8X1/2 3 WAY (MISCELLANEOUS) ×2
CONNECTOR 1/2X3/8X1/2 3WAY (MISCELLANEOUS) ×4 IMPLANT
CONT SPEC 4OZ CLIKSEAL STRL BL (MISCELLANEOUS) ×9 IMPLANT
CONT SPEC STER OR (MISCELLANEOUS) ×6 IMPLANT
COUNTER NEEDLE 20 DBL MAG RED (NEEDLE) ×3 IMPLANT
COVER BACK TABLE 24X17X13 BIG (DRAPES) ×3 IMPLANT
COVER MAYO STAND STRL (DRAPES) ×3 IMPLANT
CRADLE DONUT ADULT HEAD (MISCELLANEOUS) ×3 IMPLANT
DERMABOND ADVANCED (GAUZE/BANDAGES/DRESSINGS) ×1
DERMABOND ADVANCED .7 DNX12 (GAUZE/BANDAGES/DRESSINGS) ×2 IMPLANT
DEVICE PMI PUNCTURE CLOSURE (MISCELLANEOUS) ×3 IMPLANT
DEVICE SUT CK QUICK LOAD MINI (Prosthesis & Implant Heart) ×15 IMPLANT
DEVICE TROCAR PUNCTURE CLOSURE (ENDOMECHANICALS) ×6 IMPLANT
DRAIN CHANNEL 28F RND 3/8 FF (WOUND CARE) ×12 IMPLANT
DRAPE BILATERAL SPLIT (DRAPES) ×6 IMPLANT
DRAPE C-ARM 42X72 X-RAY (DRAPES) ×3 IMPLANT
DRAPE CV SPLIT W-CLR ANES SCRN (DRAPES) ×3 IMPLANT
DRAPE INCISE IOBAN 66X45 STRL (DRAPES) ×15 IMPLANT
DRAPE SLUSH/WARMER DISC (DRAPES) ×3 IMPLANT
DRSG COVADERM 4X8 (GAUZE/BANDAGES/DRESSINGS) ×3 IMPLANT
ELECT BLADE 6.5 EXT (BLADE) ×6 IMPLANT
ELECT REM PT RETURN 9FT ADLT (ELECTROSURGICAL) ×6
ELECTRODE REM PT RTRN 9FT ADLT (ELECTROSURGICAL) ×4 IMPLANT
FELT TEFLON 1X6 (MISCELLANEOUS) ×3 IMPLANT
FEMORAL VENOUS CANN RAP (CANNULA) IMPLANT
GLOVE BIO SURGEON STRL SZ 6.5 (GLOVE) ×15 IMPLANT
GLOVE BIO SURGEON STRL SZ7 (GLOVE) ×12 IMPLANT
GLOVE BIOGEL PI IND STRL 6.5 (GLOVE) ×2 IMPLANT
GLOVE BIOGEL PI INDICATOR 6.5 (GLOVE) ×1
GLOVE ORTHO TXT STRL SZ7.5 (GLOVE) ×9 IMPLANT
GLOVE SURG SS PI 6.0 STRL IVOR (GLOVE) ×3 IMPLANT
GOWN STRL REUS W/ TWL LRG LVL3 (GOWN DISPOSABLE) ×20 IMPLANT
GOWN STRL REUS W/TWL LRG LVL3 (GOWN DISPOSABLE) ×10
IV NS IRRIG 3000ML ARTHROMATIC (IV SOLUTION) ×3 IMPLANT
KIT BASIN OR (CUSTOM PROCEDURE TRAY) ×3 IMPLANT
KIT DILATOR VASC 18G NDL (KITS) ×6 IMPLANT
KIT DRAINAGE VACCUM ASSIST (KITS) ×3 IMPLANT
KIT ROOM TURNOVER OR (KITS) ×3 IMPLANT
KIT SUCTION CATH 14FR (SUCTIONS) ×6 IMPLANT
KIT SUT CK MINI COMBO 4X17 (Prosthesis & Implant Heart) ×3 IMPLANT
LEAD PACING MYOCARDI (MISCELLANEOUS) ×3 IMPLANT
LINE VENT (MISCELLANEOUS) ×3 IMPLANT
NEEDLE AORTIC ROOT 14G 7F (CATHETERS) ×6 IMPLANT
NS IRRIG 1000ML POUR BTL (IV SOLUTION) ×21 IMPLANT
PACK OPEN HEART (CUSTOM PROCEDURE TRAY) ×3 IMPLANT
PAD ARMBOARD 7.5X6 YLW CONV (MISCELLANEOUS) ×6 IMPLANT
PAD ELECT DEFIB RADIOL ZOLL (MISCELLANEOUS) ×3 IMPLANT
RING TRICUSPID T30 (Prosthesis & Implant Heart) ×3 IMPLANT
SET CANNULATION TOURNIQUET (MISCELLANEOUS) ×3 IMPLANT
SET CARDIOPLEGIA MPS 5001102 (MISCELLANEOUS) ×3 IMPLANT
SET IRRIG TUBING LAPAROSCOPIC (IRRIGATION / IRRIGATOR) ×3 IMPLANT
SOLUTION ANTI FOG 6CC (MISCELLANEOUS) ×3 IMPLANT
SPONGE GAUZE 4X4 12PLY STER LF (GAUZE/BANDAGES/DRESSINGS) ×3 IMPLANT
SUT BONE WAX W31G (SUTURE) ×3 IMPLANT
SUT E-PACK MINIMALLY INVASIVE (SUTURE) ×3 IMPLANT
SUT ETHIBON 2 0 V 52N 30 (SUTURE) ×6 IMPLANT
SUT ETHIBOND (SUTURE) ×12 IMPLANT
SUT ETHIBOND 2 0 SH (SUTURE) ×12 IMPLANT
SUT ETHIBOND 2 0 V4 (SUTURE) IMPLANT
SUT ETHIBOND 2 0V4 GREEN (SUTURE) IMPLANT
SUT ETHIBOND 2-0 RB-1 WHT (SUTURE) ×12 IMPLANT
SUT ETHIBOND 4 0 TF (SUTURE) IMPLANT
SUT ETHIBOND 5 0 C 1 30 (SUTURE) IMPLANT
SUT ETHIBOND NAB MH 2-0 36IN (SUTURE) ×3 IMPLANT
SUT ETHIBOND X763 2 0 SH 1 (SUTURE) ×6 IMPLANT
SUT GORETEX 6.0 TH-9 30 IN (SUTURE) IMPLANT
SUT GORETEX CV 4 TH 22 36 (SUTURE) ×6 IMPLANT
SUT GORETEX CV-5THC-13 36IN (SUTURE) IMPLANT
SUT GORETEX CV4 TH-18 (SUTURE) ×12 IMPLANT
SUT GORETEX TH-18 36 INCH (SUTURE) IMPLANT
SUT MNCRL AB 3-0 PS2 18 (SUTURE) ×6 IMPLANT
SUT PROLENE 3 0 SH DA (SUTURE) ×15 IMPLANT
SUT PROLENE 3 0 SH1 36 (SUTURE) ×48 IMPLANT
SUT PROLENE 4 0 RB 1 (SUTURE) ×7
SUT PROLENE 4-0 RB1 .5 CRCL 36 (SUTURE) ×14 IMPLANT
SUT PROLENE 5 0 C 1 36 (SUTURE) ×6 IMPLANT
SUT PROLENE 6 0 C 1 30 (SUTURE) ×6 IMPLANT
SUT SILK  1 MH (SUTURE) ×9
SUT SILK 1 MH (SUTURE) ×18 IMPLANT
SUT SILK 1 TIES 10X30 (SUTURE) ×3 IMPLANT
SUT SILK 2 0 SH CR/8 (SUTURE) ×3 IMPLANT
SUT SILK 2 0 TIES 10X30 (SUTURE) ×3 IMPLANT
SUT SILK 2 0SH CR/8 30 (SUTURE) ×6 IMPLANT
SUT SILK 3 0 (SUTURE) ×1
SUT SILK 3 0 SH CR/8 (SUTURE) ×3 IMPLANT
SUT SILK 3 0SH CR/8 30 (SUTURE) ×3 IMPLANT
SUT SILK 3-0 18XBRD TIE 12 (SUTURE) ×2 IMPLANT
SUT TEM PAC WIRE 2 0 SH (SUTURE) ×6 IMPLANT
SUT VIC AB 2-0 CTX 36 (SUTURE) ×6 IMPLANT
SUT VIC AB 2-0 UR6 27 (SUTURE) ×6 IMPLANT
SUT VIC AB 3-0 SH 8-18 (SUTURE) ×9 IMPLANT
SUT VICRYL 2 TP 1 (SUTURE) ×3 IMPLANT
SYR 10ML LL (SYRINGE) ×6 IMPLANT
SYRINGE 20CC LL (MISCELLANEOUS) ×3 IMPLANT
SYSTEM SAHARA CHEST DRAIN ATS (WOUND CARE) ×6 IMPLANT
TAPE CLOTH SURG 4X10 WHT LF (GAUZE/BANDAGES/DRESSINGS) ×3 IMPLANT
TAPE PAPER 2X10 WHT MICROPORE (GAUZE/BANDAGES/DRESSINGS) ×3 IMPLANT
TAPE UMBILICAL COTTON 1/8X30 (MISCELLANEOUS) ×3 IMPLANT
TOWEL GREEN STERILE (TOWEL DISPOSABLE) ×6 IMPLANT
TOWEL GREEN STERILE FF (TOWEL DISPOSABLE) ×3 IMPLANT
TOWEL OR 17X24 6PK STRL BLUE (TOWEL DISPOSABLE) ×9 IMPLANT
TOWEL OR 17X26 10 PK STRL BLUE (TOWEL DISPOSABLE) ×9 IMPLANT
TRAY FOLEY IC TEMP SENS 16FR (CATHETERS) ×6 IMPLANT
TROCAR XCEL BLADELESS 5X75MML (TROCAR) ×3 IMPLANT
TROCAR XCEL NON-BLD 11X100MML (ENDOMECHANICALS) ×6 IMPLANT
TUBE SUCT INTRACARD DLP 20F (MISCELLANEOUS) ×3 IMPLANT
TUNNELER SHEATH ON-Q 11GX8 DSP (PAIN MANAGEMENT) IMPLANT
UNDERPAD 30X30 (UNDERPADS AND DIAPERS) ×3 IMPLANT
VALVE MAGNA MITRAL 31MM (Prosthesis & Implant Heart) ×3 IMPLANT
WATER STERILE IRR 1000ML POUR (IV SOLUTION) ×6 IMPLANT
WIRE .035 3MM-J 145CM (WIRE) ×3 IMPLANT

## 2016-10-25 NOTE — Anesthesia Preprocedure Evaluation (Addendum)
Anesthesia Evaluation    Reviewed: Allergy & Precautions, NPO status , Patient's Chart, lab work & pertinent test results  History of Anesthesia Complications Negative for: history of anesthetic complications  Airway Mallampati: II  TM Distance: >3 FB Neck ROM: Full    Dental  (+) Edentulous Upper, Edentulous Lower   Pulmonary neg COPD, neg recent URI, former smoker,    breath sounds clear to auscultation       Cardiovascular hypertension, Pt. on medications and Pt. on home beta blockers (-) angina+CHF  + dysrhythmias + Valvular Problems/Murmurs MR  Rhythm:Regular Rate:Normal  1/18 ECHO: EF 50% to 55%. - Aortic valve: mild regurgitation. - Mitral valve: severe regurgitation. -Tri valve: mod regurgitation   Neuro/Psych negative neurological ROS  negative psych ROS   GI/Hepatic negative GI ROS, (+)     substance abuse  alcohol use,   Endo/Other    Renal/GU CRFRenal disease (creat 1.47)     Musculoskeletal   Abdominal   Peds  Hematology  (+) Blood dyscrasia (Hb 10.8), anemia ,   Anesthesia Other Findings   Reproductive/Obstetrics                            Anesthesia Physical Anesthesia Plan  ASA: IV  Anesthesia Plan: General   Post-op Pain Management:    Induction: Intravenous  Airway Management Planned: Double Lumen EBT and Oral ETT  Additional Equipment: Arterial line, TEE, CVP, PA Cath and Ultrasound Guidance Line Placement  Intra-op Plan:   Post-operative Plan: Post-operative intubation/ventilation  Informed Consent: I have reviewed the patients History and Physical, chart, labs and discussed the procedure including the risks, benefits and alternatives for the proposed anesthesia with the patient or authorized representative who has indicated his/her understanding and acceptance.   Dental advisory given  Plan Discussed with: CRNA and Surgeon  Anesthesia Plan Comments:  (Plan routine monitors, A line, PA cath, GETA with DLT, TEE, post op ventilation)       Anesthesia Quick Evaluation

## 2016-10-25 NOTE — Progress Notes (Signed)
      [address]       [place] [address]             [phone]      S/p mitral replacement tricuspid repair  BP 103/82   Pulse 90   Temp 97.4 F (36.3 C) (Oral)   Resp 12   Ht 5\' 11"  (1.803 m)   Wt 154 lb 15.7 oz (70.3 kg)   SpO2 90%   BMI 21.62 kg/m   PA 38/24, CI 1.2-1.4 on epi at 2 and milrinone at 0.5  Intake/Output Summary (Last 24 hours) at 10/25/16 1710 Last data filed at 10/25/16 1701  Gross per 24 hour  Intake             2740 ml  Output              715 ml  Net             2025 ml   Hct= 35, K= 4.3  Low output early postop will increase epi to 5 mcg/min and optimize filling P sats marginal on 100%  Steven C. Roxan Hockey, MD [facility] [phone]

## 2016-10-25 NOTE — OR Nursing (Signed)
1523 #96fr/10ml temp foley exchanged out d/t leaking. Upon insertion of new #68fr/10 ml temp foley clear yellow urine return noted with sediment.

## 2016-10-25 NOTE — Progress Notes (Signed)
RT note-Sp02 remains at 92%. Recruitment maneuvers performed and nebulizer given. RR and VT increased, continue to monitor.

## 2016-10-25 NOTE — Op Note (Signed)
CARDIOTHORACIC SURGERY OPERATIVE NOTE  Date of Procedure:  10/25/2016  Preoperative Diagnosis: Severe Mitral Regurgitation  Postoperative Diagnosis: Same  Procedure:    Minimally-Invasive Mitral Valve Replacement  Preservation of chordae tendinae to both the anterior and posterior leaflets  [facility] Mitral Bovine Bioprosthetic Tissue Valve (size 20mm, model # 7300TFX, serial # [id number])   Minimally-Invasive Tricuspid Valve Repair  Edwards mc3 Ring Annuloplasty (size 48mm, model # [id number], serial # [id number])   Surgeon: Valentina Gu. 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  Type IIIA mitral valve dysfunction with severe mitral regurgitation  Severe global LV systolic dysfunction  Severe pulmonary hypertension  Type I tricuspid valve dysfunction with moderate tricuspid regurgitation  No residual tricuspid regurgitation after successful valve repair                BRIEF CLINICAL NOTE AND INDICATIONS FOR SURGERY  Patient is a 66 year old African-American male with history of mitral regurgitation, tricuspid regurgitation, chronic combined systolic and diastolic congestive heart failure, RBBB,hypertension, stage III chronic kidney disease, hyperlipidemia, previous heavy alcohol abuse and tobacco abuse, and prostate cancer who has been referred for surgical consultation to discuss treatment options for management of severe symptomatic mitral regurgitation. The patient reportedly has a long history of heavy alcohol and tobacco abuse. He began to feel poorly more than a year ago and made efforts to cut back and eventually quit drinking and smoking completely. He successfully stopped drinking and smoking last July. At that time he presented with symptoms of class IV congestive heart failure and a brief syncopal  episode that was attributed to possible alcohol withdrawal seizure. He was hospitalized at [facility] and started on medical therapy for congestive heart failure. Transthoracic echocardiogram performed at that time revealed severe mitral regurgitation, moderate tricuspid regurgitation and left ventricular ejection fraction estimated 50-55%. Symptoms improved and the patient has continued to abstain from any tobacco or alcohol use. He has been seen in follow-up on several occasions at [facility] has continued to complain of symptoms of exertional shortness of breath despite remaining compliant with optimal medical therapy for congestive heart failure. He was seen in follow-up recently and scheduled for transesophageal echocardiogram and diagnostic cardiac catheterization. TEE performed 09/06/2016 confirmed the presence of severe mitral regurgitation, mild aortic insufficiency, and mild to moderate tricuspid regurgitation. Left ventricular ejection fraction was reported 50-55%. Left and right heart catheterization performed 09/06/2016 was notable for the absence of significant coronary artery disease. The patient had moderate to severe pulmonary hypertension with large V waves on wedge tracing consistent with severe mitral regurgitation. The patient was referred for surgical consultation.  The patient has been seen in consultation and counseled at length regarding the indications, risks and potential benefits of surgery.  All questions have been answered, and the patient provides full informed consent for the operation as described.    DETAILS OF THE OPERATIVE PROCEDURE  Preparation:  The patient is brought to the operating room on the above mentioned date and central monitoring was established by the anesthesia team including placement of Swan-Ganz catheter through the left internal jugular vein.  There was severe pulmonary hypertension at baseline with pulmonary artery pressures  123456 systolic pressure. A radial arterial line is placed. The patient is placed in the supine position on the operating table.  Intravenous antibiotics are administered. General endotracheal anesthesia is induced uneventfully. The patient  is initially intubated using a dual lumen endotracheal tube.  A Foley catheter is placed.  Baseline transesophageal echocardiogram was performed.  Findings were notable for global dilated cardiomyopathy. The left ventricle was severely enlarged. The mitral annulus was dilated. There was severe mitral regurgitation.  Both the anterior and the posterior leaflet of the mitral valve were severely restricted throughout the cardiac cycle.  There was very mild thickening of the leaflets and overall there were no findings to suggest underlying rheumatic or inflammatory etiology. Rather, the chordae tendineae and subvalvular apparatus appeared severely foreshortened. There was a single thickened foreshortened chordae from the posterior papillary muscle to the anterior leaflet.  There was no significant calcification. There were 2 papillary muscles. The right ventricle and right atrium was dilated. The tricuspid annulus was severely dilated. There was moderate tricuspid regurgitation with normal leaflet mobility.  A soft roll is placed behind the patient's left scapula and the neck gently extended and turned to the left.   The patient's right neck, chest, abdomen, both groins, and both lower extremities are prepared and draped in a sterile manner. A time out procedure is performed.  Surgical Approach:  A right miniature anterolateral thoracotomy incision is performed. The incision is placed just lateral to and superior to the right nipple. The pectoralis major muscle is retracted medially and completely preserved. The right pleural space is entered through the 3rd intercostal space. A soft tissue retractor is placed.  Two 11 mm ports are placed through separate stab incisions  inferiorly. The right pleural space is insufflated continuously with carbon dioxide gas through the posterior port during the remainder of the operation.  A pledgeted sutures placed through the dome of the right hemidiaphragm and retracted inferiorly to facilitate exposure.  A longitudinal incision is made in the pericardium 3 cm anterior to the phrenic nerve and silk traction sutures are placed on either side of the incision for exposure.   Extracorporeal Cardiopulmonary Bypass and Myocardial Protection:  A small incision is made in the right inguinal crease and the anterior surface of the right common femoral artery and right common femoral vein are identified.  The patient is placed in Trendelenburg position. The right internal jugular vein is cannulated with Seldinger technique and a guidewire advanced into the right atrium. The patient is heparinized systemically. The right internal jugular vein is cannulated with a 14 [place] pediatric femoral venous cannula. Pursestring sutures are placed on the anterior surface of the right common femoral vein and right common femoral artery. The right common femoral vein is cannulated with the Seldinger technique and a guidewire is advanced under transesophageal echocardiogram guidance through the right atrium. The femoral vein is cannulated with a long 22 French femoral venous cannula. The right common femoral artery is cannulated with Seldinger technique and a flexible guidewire is advanced until it can be appreciated intraluminally in the descending thoracic aorta on transesophageal echocardiogram. The femoral artery is cannulated with an 18 French femoral arterial cannula.  Adequate heparinization is verified.     The entire pre-bypass portion of the operation was notable for stable hemodynamics.  Cardiopulmonary bypass was begun.  Vacuum assist venous drainage is utilized. The incision in the pericardium is extended in both directions. Venous drainage and  exposure are notably excellent. A retrograde cardioplegia cannula is placed through the right atrium into the coronary sinus using transesophageal echocardiogram guidance.  An antegrade cardioplegia cannula is placed in the ascending aorta.    The patient is cooled to 28C systemic temperature.  The aortic cross clamp is applied and cold blood cardioplegia is delivered initially in an antegrade fashion through the aortic root.   Supplemental cardioplegia is given retrograde through the coronary sinus catheter. The initial cardioplegic arrest is rapid with early diastolic arrest.  Repeat doses of cardioplegia are administered intermittently every 20 to 30 minutes throughout the entire cross clamp portion of the operation through the aortic root and through the coronary sinus catheter in order to maintain completely flat electrocardiogram.  Myocardial protection was felt to be excellent.   Mitral Valve Replacement:  A left atriotomy incision was performed through the interatrial groove and extended partially across the back wall of the left atrium after opening the oblique sinus inferiorly.  The mitral valve is exposed using a self-retaining retractor.  The mitral valve was inspected and notable for an unusual congenital abnormality with severe malformation of the entire subvalvular apparatus. The papillary muscles were in the normal anatomical positions. However, there were very few chordae tendineae to the posterior leaflet. Rather, the posterior leaflet extended as a broad leaflet all of the way to the subvalvular apparatus and papillary muscles which looked like a muscular web. There was also severe foreshortening of the anterior leaflet subvalvular apparatus .  Interrupted 2-0 Ethibond horizontal mattress sutures are placed circumferentially around the entire mitral valve annulus. The sutures will ultimately be utilized for ring annuloplasty, and at this juncture there are utilized to suspend the valve  symmetrically.  The single thickened foreshortened chordae tendineae from the posterior papillary muscles of the anterior leaflet was cut and resected. A broad muscular band to the posterior leaflet was cut and resected. The valve was further inspected with consideration of possible repair. There remained severe restriction of both leaflets, and at this junction it appears clear that the only possible means to repair the valve would require patch augmentation of both the anterior and posterior leaflets.  Because of the patient's severe underlying global left ventricular systolic dysfunction, and attempt at repair is aborted.  All of the annuloplasty sutures are removed.  Mitral valve replacement is performed using chord preserving techniques. The majority of the anterior leaflet of the mitral valve is excised. Portions of the free margin of the leaflet and the associated subvalvular apparatus to the middle portion of A2 were preserved and incorporated laterally into the annulus with 4-0 Prolene sutures. The posterior leaflet is split in the midline.  Mitral valve replacement is performed using interrupted 2-0 Ethibond horizontal mattress pledgeted sutures with pledgets in the supra-annular position.   The valve is sized to accept a 31 mm stented bioprosthetic tissue valve. An [facility] Mitral bovine pericardial tissue valve (size 81mm, model # 7300TFX, serial # [id number]) is rinsed and prepared for implantation per manufacture protocol. The valve is secured in place uneventfully.  Care is taken to orient the valve such that the stent posts are located beneath the fibrous trigones and not extending into the left ventricular outflow tract.  All sutures were secured using a Cor-knot device.  The valve is tested to ensure normal function and carefully inspected to make sure there is no gaps around the sewing ring. The left atriotomy is closed after placing a sump drain across the mitral valve to serve as a left  ventricular vent.   Tricuspid Valve Repair:  The inferior vena cava cannula was pulled down until the tip was just below the junction between the right atrium and the inferior vena cava. An oblique incision is made in the  right atrium. Traction sutures are placed to facilitate exposure of the tricuspid valve. The tricuspid valve is inspected carefully. The tricuspid valve leaflets appear normal with normal mobility and no sign of any fibrosis or thickening.   Tricuspid ring annuloplasty is performed using interrupted 2-0 Ethibond horizontal mattress sutures placed circumferentially around the tricuspid annulus with exception of the area immediately below the triangle of Koch.   After placement of all of the annuloplasty sutures a single dose of warm retrograde "hot shot" blood cardioplegia was given and the aortic cross clamp removed after a total cross clamp duration of 150 minutes.  The tricuspid valve was sized to accept a 30 mm annuloplasty ring based upon the overall surface area of the combined anterior and posterior leaflets. An [facility] 3 annuloplasty ring (size 30 mm, model #4900, serial [id number]) is implanted uneventfully. After completion of the annuloplasty the valve was tested with saline and appears to be competent. The right atriotomy incision is closed using a 2 layer closure of running 4-0 Prolene suture.   Procedure Completion:  Epicardial pacing wires are fixed to the inferior wall of the right ventricule and to the right atrial appendage. The patient is rewarmed to 37C temperature. The left ventricular vent is removed.  The antegrade cardioplegia cannula is removed. The patient is weaned and disconnected from cardiopulmonary bypass.  The patient's rhythm at separation from bypass was AV paced.  The patient was weaned from bypass on low dose milrinone and epinephrine infusions. Total cardiopulmonary bypass time for the operation was 224 minutes.  Followup transesophageal  echocardiogram performed after separation from bypass revealed a well-seated bioprosthetic tissue valve in the mitral position with a normal functioning mitral valve. There was trace central residual leak.  Left ventricular function remains severely reduced with ejection fraction estimated less than 20%.  Ejection fraction is felt to appear less than preoperatively because of the presence of severe mitral regurgitation during the preoperative examination.  There is normal right ventricular function. There is an annuloplasty ring in the tricuspid position. The tricuspid valve is functioning normally with trivial residual leak.  The femoral arterial and venous cannulae were removed uneventfully. There was a palpable pulse in the distal right common femoral artery after removal of the cannula. Protamine was administered to reverse the anticoagulation. The right internal jugular cannula was removed and manual pressure held on the neck for 15 minutes.  Single lung ventilation was begun. The atriotomy closure was inspected for hemostasis. The pericardial sac was drained using a 28 French Bard drain placed through the anterior port incision.  The pericardium was closed using a patch of core matrix bovine submucosal tissue patch. The right pleural space is irrigated with saline solution and inspected for hemostasis. The right pleural space was drained using a 28 French Bard drain placed through the posterior port incision. The miniature thoracotomy incision was closed in multiple layers in routine fashion. The right groin incision was inspected for hemostasis and closed in multiple layers in routine fashion.  The post-bypass portion of the operation was notable for stable rhythm and hemodynamics.  No blood products were administered during the operation.   Disposition:  The patient tolerated the procedure well.  The patient was reintubated using a single lumen endotracheal tube and subsequently transported to the  surgical intensive care unit in stable condition. There were no intraoperative complications. All sponge instrument and needle counts are verified correct at completion of the operation.     Valentina Gu. Roxy Manns MD 10/25/2016  3:16 PM

## 2016-10-25 NOTE — Transfer of Care (Signed)
Immediate Anesthesia Transfer of Care Note  Patient: Aaron Bennett  Procedure(s) Performed: Procedure(s): MINIMALLY INVASIVE TRICUSPID VALVE REPAIR (Right) TRANSESOPHAGEAL ECHOCARDIOGRAM (TEE) (N/A) MINIMALLY INVASIVE MITRAL VALVE (MV) REPLACEMENT (Right)  Patient Location: SICU  Anesthesia Type:General  Level of Consciousness: sedated and Patient remains intubated per anesthesia plan  Airway & Oxygen Therapy: Patient remains intubated per anesthesia plan and Patient placed on Ventilator (see vital sign flow sheet for setting)  Post-op Assessment: Report given to RN and Post -op Vital signs reviewed and stable  Post vital signs: Reviewed and stable  Last Vitals:  Vitals:   10/25/16 0643  BP: 103/82  Pulse: 81  Resp: 16  Temp: 36.3 C    Last Pain:  Vitals:   10/25/16 0643  TempSrc: Oral      Patients Stated Pain Goal: 3 ([id number])  Complications: No apparent anesthesia complications

## 2016-10-25 NOTE — Anesthesia Procedure Notes (Signed)
Central Venous Catheter Insertion Performed by: Oleta Mouse, anesthesiologist Start/End2/28/2018 7:08 AM, 10/25/2016 7:16 AM Patient location: Pre-op. Preanesthetic checklist: patient identified, IV checked, site marked, risks and benefits discussed, surgical consent, monitors and equipment checked, pre-op evaluation, timeout performed and anesthesia consent Hand hygiene performed  and maximum sterile barriers used  PA cath was placed.Swan type:thermodilution Procedure performed without using ultrasound guided technique. Attempts: 1 Patient tolerated the procedure well with no immediate complications.

## 2016-10-25 NOTE — Progress Notes (Signed)
  Echocardiogram Echocardiogram Transesophageal has been performed.  Darlina Sicilian M 10/25/2016, 9:28 AM

## 2016-10-25 NOTE — Anesthesia Procedure Notes (Signed)
Central Venous Catheter Insertion Performed by: Oleta Mouse, anesthesiologist Start/End2/28/2018 7:08 AM, 10/25/2016 7:16 AM Patient location: Pre-op. Preanesthetic checklist: patient identified, IV checked, site marked, risks and benefits discussed, surgical consent, monitors and equipment checked, pre-op evaluation, timeout performed and anesthesia consent Lidocaine 1% used for infiltration and patient sedated Hand hygiene performed  and maximum sterile barriers used  Catheter size: 9 Fr Total catheter length 10. MAC introducer Procedure performed using ultrasound guided technique. Ultrasound Notes:anatomy identified, needle tip was noted to be adjacent to the nerve/plexus identified, no ultrasound evidence of intravascular and/or intraneural injection and image(s) printed for medical record Attempts: 1 Following insertion, line sutured and dressing applied. Post procedure assessment: blood return through all ports, free fluid flow and no air  Patient tolerated the procedure well with no immediate complications.

## 2016-10-25 NOTE — Interval H&P Note (Signed)
History and Physical Interval Note:  10/25/2016 6:39 AM  Aaron Bennett  has presented today for surgery, with the diagnosis of MR TR  The various methods of treatment have been discussed with the patient and family. After consideration of risks, benefits and other options for treatment, the patient has consented to  Procedure(s): MINIMALLY INVASIVE MITRAL VALVE REPAIR OR REPLACEMENT (MVR) (Right) POSSIBLE MINIMALLY INVASIVE TRICUSPID VALVE REPAIR (Right) TRANSESOPHAGEAL ECHOCARDIOGRAM (TEE) (N/A) as a surgical intervention .  The patient's history has been reviewed, patient examined, no change in status, stable for surgery.  I have reviewed the patient's chart and labs.  Questions were answered to the patient's satisfaction.     [name]

## 2016-10-25 NOTE — Brief Op Note (Addendum)
10/25/2016  1:35 PM  PATIENT:  Aaron Bennett  66 y.o. 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

## 2016-10-26 ENCOUNTER — Encounter (HOSPITAL_COMMUNITY): Payer: Self-pay | Admitting: Thoracic Surgery (Cardiothoracic Vascular Surgery)

## 2016-10-26 ENCOUNTER — Inpatient Hospital Stay (HOSPITAL_COMMUNITY): Payer: Medicare HMO

## 2016-10-26 LAB — CBC
HEMATOCRIT: 30.4 % — AB (ref 39.0–52.0)
HEMATOCRIT: 28.3 % — AB (ref 39.0–52.0)
HEMOGLOBIN: 10 g/dL — AB (ref 13.0–17.0)
HEMOGLOBIN: 9.2 g/dL — AB (ref 13.0–17.0)
MCH: 32.4 pg (ref 26.0–34.0)
MCH: 32.2 pg (ref 26.0–34.0)
MCHC: 32.9 g/dL (ref 30.0–36.0)
MCHC: 32.5 g/dL (ref 30.0–36.0)
MCV: 98.4 fL (ref 78.0–100.0)
MCV: 99 fL (ref 78.0–100.0)
Platelets: 106 10*3/uL — ABNORMAL LOW (ref 150–400)
Platelets: 89 10*3/uL — ABNORMAL LOW (ref 150–400)
RBC: 3.09 MIL/uL — ABNORMAL LOW (ref 4.22–5.81)
RBC: 2.86 MIL/uL — AB (ref 4.22–5.81)
RDW: 14.2 % (ref 11.5–15.5)
RDW: 14.4 % (ref 11.5–15.5)
WBC: 10.5 10*3/uL (ref 4.0–10.5)
WBC: 13.4 10*3/uL — ABNORMAL HIGH (ref 4.0–10.5)

## 2016-10-26 LAB — POCT I-STAT 3, ART BLOOD GAS (G3+)
Acid-base deficit: 4 mmol/L — ABNORMAL HIGH (ref 0.0–2.0)
Acid-base deficit: 4 mmol/L — ABNORMAL HIGH (ref 0.0–2.0)
Acid-base deficit: 3 mmol/L — ABNORMAL HIGH (ref 0.0–2.0)
BICARBONATE: 24.3 mmol/L (ref 20.0–28.0)
Bicarbonate: 20.3 mmol/L (ref 20.0–28.0)
Bicarbonate: 21.4 mmol/L (ref 20.0–28.0)
Bicarbonate: 20.9 mmol/L (ref 20.0–28.0)
O2 SAT: 94 %
O2 SAT: 95 %
O2 Saturation: 91 %
O2 Saturation: 87 %
PCO2 ART: 33.4 mmHg (ref 32.0–48.0)
PCO2 ART: 38.3 mmHg (ref 32.0–48.0)
PCO2 ART: 34.8 mmHg (ref 32.0–48.0)
PH ART: 7.394 (ref 7.350–7.450)
PH ART: 7.355 (ref 7.350–7.450)
PO2 ART: 75 mmHg — AB (ref 83.0–108.0)
PO2 ART: 56 mmHg — AB (ref 83.0–108.0)
Patient temperature: 36.7
Patient temperature: 37.3
Patient temperature: 37.9
TCO2: 21 mmol/L (ref 0–100)
TCO2: 23 mmol/L (ref 0–100)
TCO2: 25 mmol/L (ref 0–100)
TCO2: 22 mmol/L (ref 0–100)
pCO2 arterial: 37.7 mmHg (ref 32.0–48.0)
pH, Arterial: 7.418 (ref 7.350–7.450)
pH, Arterial: 7.39 (ref 7.350–7.450)
pO2, Arterial: 62 mmHg — ABNORMAL LOW (ref 83.0–108.0)
pO2, Arterial: 76 mmHg — ABNORMAL LOW (ref 83.0–108.0)

## 2016-10-26 LAB — GLUCOSE, CAPILLARY
GLUCOSE-CAPILLARY: 104 mg/dL — AB (ref 65–99)
GLUCOSE-CAPILLARY: 112 mg/dL — AB (ref 65–99)
GLUCOSE-CAPILLARY: 117 mg/dL — AB (ref 65–99)
GLUCOSE-CAPILLARY: 132 mg/dL — AB (ref 65–99)
GLUCOSE-CAPILLARY: 144 mg/dL — AB (ref 65–99)
GLUCOSE-CAPILLARY: 162 mg/dL — AB (ref 65–99)
GLUCOSE-CAPILLARY: 171 mg/dL — AB (ref 65–99)
GLUCOSE-CAPILLARY: 84 mg/dL (ref 65–99)
GLUCOSE-CAPILLARY: 89 mg/dL (ref 65–99)
GLUCOSE-CAPILLARY: 95 mg/dL (ref 65–99)
GLUCOSE-CAPILLARY: 96 mg/dL (ref 65–99)
GLUCOSE-CAPILLARY: 99 mg/dL (ref 65–99)
Glucose-Capillary: 104 mg/dL — ABNORMAL HIGH (ref 65–99)
Glucose-Capillary: 124 mg/dL — ABNORMAL HIGH (ref 65–99)
Glucose-Capillary: 132 mg/dL — ABNORMAL HIGH (ref 65–99)
Glucose-Capillary: 93 mg/dL (ref 65–99)
Glucose-Capillary: 99 mg/dL (ref 65–99)

## 2016-10-26 LAB — COOXEMETRY PANEL
CARBOXYHEMOGLOBIN: 0.9 % (ref 0.5–1.5)
Methemoglobin: 1.3 % (ref 0.0–1.5)
O2 Saturation: 66.3 %
Total hemoglobin: 9.7 g/dL — ABNORMAL LOW (ref 12.0–16.0)

## 2016-10-26 LAB — CREATININE, SERUM
CREATININE: 2.11 mg/dL — AB (ref 0.61–1.24)
GFR calc Af Amer: 36 mL/min — ABNORMAL LOW (ref >60)
GFR calc non Af Amer: 31 mL/min — ABNORMAL LOW (ref >60)

## 2016-10-26 LAB — POCT I-STAT, CHEM 8
BUN: 22 mg/dL — ABNORMAL HIGH (ref 6–20)
CHLORIDE: 106 mmol/L (ref 101–111)
Calcium, Ion: 1.15 mmol/L (ref 1.15–1.40)
Creatinine, Ser: 1.7 mg/dL — ABNORMAL HIGH (ref 0.61–1.24)
GLUCOSE: 158 mg/dL — AB (ref 65–99)
HCT: 34 % — ABNORMAL LOW (ref 39.0–52.0)
HEMOGLOBIN: 11.6 g/dL — AB (ref 13.0–17.0)
POTASSIUM: 3.4 mmol/L — AB (ref 3.5–5.1)
SODIUM: 141 mmol/L (ref 135–145)
TCO2: 25 mmol/L (ref 0–100)

## 2016-10-26 LAB — BASIC METABOLIC PANEL
Anion gap: 6 (ref 5–15)
BUN: 18 mg/dL (ref 6–20)
CO2: 23 mmol/L (ref 22–32)
CREATININE: 1.75 mg/dL — AB (ref 0.61–1.24)
Calcium: 9.1 mg/dL (ref 8.9–10.3)
Chloride: 110 mmol/L (ref 101–111)
GFR calc non Af Amer: 39 mL/min — ABNORMAL LOW (ref >60)
GFR, EST AFRICAN AMERICAN: 45 mL/min — AB (ref >60)
Glucose, Bld: 122 mg/dL — ABNORMAL HIGH (ref 65–99)
Potassium: 3.5 mmol/L (ref 3.5–5.1)
Sodium: 139 mmol/L (ref 135–145)

## 2016-10-26 LAB — POCT I-STAT 4, (NA,K, GLUC, HGB,HCT)
GLUCOSE: 140 mg/dL — AB (ref 65–99)
HEMATOCRIT: 29 % — AB (ref 39.0–52.0)
Hemoglobin: 9.9 g/dL — ABNORMAL LOW (ref 13.0–17.0)
Potassium: 4.7 mmol/L (ref 3.5–5.1)
SODIUM: 139 mmol/L (ref 135–145)

## 2016-10-26 LAB — MAGNESIUM
Magnesium: 2.4 mg/dL (ref 1.7–2.4)
Magnesium: 1.8 mg/dL (ref 1.7–2.4)

## 2016-10-26 MED ORDER — FUROSEMIDE 10 MG/ML IJ SOLN
8.0000 mg/h | INTRAVENOUS | Status: DC
  Administered 2016-10-26 – 2016-10-27 (×2): 8 mg/h via INTRAVENOUS
  Filled 2016-10-26 (×3): qty 25

## 2016-10-26 MED ORDER — FAMOTIDINE IN NACL 20-0.9 MG/50ML-% IV SOLN
20.0000 mg | Freq: Two times a day (BID) | INTRAVENOUS | Status: DC
  Administered 2016-10-26: 20 mg via INTRAVENOUS
  Filled 2016-10-26: qty 50

## 2016-10-26 MED ORDER — INSULIN ASPART 100 UNIT/ML ~~LOC~~ SOLN
0.0000 [IU] | SUBCUTANEOUS | Status: DC
  Administered 2016-10-27: 2 [IU] via SUBCUTANEOUS

## 2016-10-26 MED ORDER — METOCLOPRAMIDE HCL 5 MG/ML IJ SOLN
10.0000 mg | Freq: Four times a day (QID) | INTRAMUSCULAR | Status: AC
  Administered 2016-10-26 – 2016-10-27 (×4): 10 mg via INTRAVENOUS
  Filled 2016-10-26 (×4): qty 2

## 2016-10-26 MED ORDER — POTASSIUM CHLORIDE 2 MEQ/ML IV SOLN
30.0000 meq | Freq: Once | INTRAVENOUS | Status: AC
  Administered 2016-10-26: 30 meq via INTRAVENOUS
  Filled 2016-10-26: qty 15

## 2016-10-26 NOTE — Progress Notes (Addendum)
RT note- ABG done and parameter performed NIF-28  FVC-1.2L  Cuff leak is positive

## 2016-10-26 NOTE — Significant Event (Signed)
ABG obtained after RRT made ventilator changes in decreasing RR from 16 to 12, and FIO2 from 60 to 50. New ABG results as following.    Results for NEHEMIAS, KNAFF (MRN [id number])   Ref. Range 10/26/2016 09:02  Sample type Unknown ARTERIAL  pH, Arterial Latest Ref Range: 7.350 - 7.450  7.355  pCO2 arterial Latest Ref Range: 32.0 - 48.0 mmHg 38.3  pO2, Arterial Latest Ref Range: 83.0 - 108.0 mmHg 75.0 (L)  TCO2 Latest Ref Range: 0 - 100 mmol/L 23  Acid-base deficit Latest Ref Range: 0.0 - 2.0 mmol/L 4.0 (H)  Bicarbonate Latest Ref Range: 20.0 - 28.0 mmol/L 21.4  O2 Saturation Latest Units: % 94.0  Patient temperature Unknown 36.7 C   Thedora Rings

## 2016-10-26 NOTE — Procedures (Signed)
Extubation Procedure Note  Patient Details:   Name: Tykeem Zaya DOB: 1950/12/27 MRN: [id number]   Airway Documentation:     Evaluation  O2 sats: transiently fell during during procedure and currently acceptable Complications: No apparent complications Patient did tolerate procedure well. Bilateral Breath Sounds: Clear, Diminished   Yes  Initially placed on 6L nasal canula then transitioned to HFNC at 10-12 L, sp02 92-95% Incentive spirometer instructed 782ml  Revonda Standard 10/26/2016, 5:53 PM

## 2016-10-26 NOTE — Progress Notes (Signed)
Patient ID: Aaron Bennett, male   DOB: 1951-07-23, 66 y.o.   MRN: [id number]  SICU Evening Rounds:  Hemodynamics fairly stable on milrinone 0.375, epi 5, dop 3.   Sats 100% on 12L HFNC  PO2 56, 87%sat on ABG this afternoon.  Diuresing well on lasix drip 8.   CT output low.  BMET    Component Value Date/Time   NA 139 10/26/2016 1653   K 4.7 10/26/2016 1653   CL 110 10/26/2016 0400   CO2 23 10/26/2016 0400   GLUCOSE 140 (H) 10/26/2016 1653   BUN 18 10/26/2016 0400   CREATININE 2.11 (H) 10/26/2016 1645   CREATININE 1.49 (H) 08/30/2016 1444   CALCIUM 9.1 10/26/2016 0400   GFRNONAA 31 (L) 10/26/2016 1645   GFRAA 36 (L) 10/26/2016 1645   CBC    Component Value Date/Time   WBC 13.4 (H) 10/26/2016 1645   RBC 2.86 (L) 10/26/2016 1645   HGB 9.9 (L) 10/26/2016 1653   HCT 29.0 (L) 10/26/2016 1653   PLT 89 (L) 10/26/2016 1645   MCV 99.0 10/26/2016 1645   MCH 32.2 10/26/2016 1645   MCHC 32.5 10/26/2016 1645   RDW 14.4 10/26/2016 1645   LYMPHSABS 2,419 05/18/2016 0943   MONOABS 590 05/18/2016 0943   EOSABS 177 05/18/2016 0943   BASOSABS 0 05/18/2016 0943    Continue current plans. Encourage IS.

## 2016-10-26 NOTE — Progress Notes (Addendum)
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) Subjective: Intubated and sedated  Objective: Vital signs in last 24 hours: Temp:  [93.7 F (34.3 C)-99.7 F (37.6 C)] 98.8 F (37.1 C) (03/01 0700) Pulse Rate:  [72-92] 79 (03/01 0700) Cardiac Rhythm: Normal sinus rhythm (03/01 0600) Resp:  [12-24] 16 (03/01 0700) BP: (83-130)/(59-86) 117/81 (03/01 0700) SpO2:  [85 %-100 %] 96 % (03/01 0700) Arterial Line BP: (88-154)/(56-81) 124/65 (03/01 0700) FiO2 (%):  [50 %-100 %] 60 % (03/01 0700) Weight:  [70.3 kg (154 lb 15.7 oz)-74.5 kg (164 lb 3.9 oz)] 74.5 kg (164 lb 3.9 oz) (03/01 0500)  Hemodynamic parameters for last 24 hours: PAP: (31-45)/(21-31) 36/24 CO:  [2.4 L/min-4.6 L/min] 4.6 L/min CI:  [1.3 L/min/m2-2.4 L/min/m2] 2.4 L/min/m2  Intake/Output from previous day: 02/28 0701 - 03/01 0700 In: 7737.4 [I.V.:5397.4; Blood:490; NG/GT:120; IV [id number] Out: 5780 [Urine:4870; Emesis/NG output:50; Chest Tube:860] Intake/Output this shift: No intake/output data recorded.  General appearance: no distress Heart: regular rate and rhythm, S1, S2 normal, no murmur, click, rub or gallop Lungs: diffuse rhonchi and ventilator sounds Abdomen: soft, diminished bowel sounds Extremities: 1+ non-pitting pedal edema Wound: clean and dry dressed with a sterile dressing  Lab Results:  Recent Labs  10/25/16 2222 10/25/16 2228 10/26/16 0400  WBC 8.7  --  10.5  HGB 11.0* 11.6* 10.0*  HCT 33.8* 34.0* 30.4*  PLT 112*  --  106*   BMET:  Recent Labs  10/25/16 0646  10/25/16 2228 10/26/16 0400  NA 139  < > 141 139  K 3.8  < > 3.4* 3.5  CL 104  < > 106 110  CO2 25  --   --  23  GLUCOSE 93  < > 158* 122*  BUN 22*  < > 22* 18  CREATININE 1.47*  < >  1.70* 1.75*  CALCIUM 9.1  --   --  9.1  < > = values in this interval not displayed.  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 CV-NSR 70-80 bmp. Blood pressure stable on Dopamine, Amio, Milrinone, Epi, and Neo. Will wean Neo first as tolerated, then Epi slowly.  2. Pulm- remains intubated. ABG with pO2 dropping from 158 to 62 on FiO2 of 60. Keep intubated for now. About 851ml out of chest tubes-keep. CXR showed diffuse bilateral pulmonary infiltrates and right sided pleural effusion.  3. Renal-creatinine stable. Electrolytes okay. Making good urine. On renal dose dopamine 4. GI- CXR with large gas bubble. Keep gastric tube on suction, except for passing medications. 5. H and H with expected blood loss anemia.  6. Endo-blood glucose level well controlled.   Plan: Continue critical care. Weaning Neo, then  Epi slowly. Keep intubated for now. Keep chest tubes due to high output. Continue swan ganz and arterial line.    LOS: 1 day    Elgie Collard 10/26/2016  I have seen and examined the patient and agree with the assessment and plan as outlined.  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  May not be ready for trial extubation.  Maintaining NSR w/ stable hemodynamics on low dose Epi and milrinone @ 0.5.  Diuresed  very well overnight.  Will start lasix drip.  Wean Neo then Epi slowly as tolerated.  Continue milrinone.  Reconsider acute vent wean later today or tomorrow depending on improvement in oxygenation.  [name], MD 10/26/2016 8:23 AM

## 2016-10-26 NOTE — Care Management Note (Addendum)
Case Management Note  Patient Details   Name: Aaron Bennett MRN: [id number]    Date of Birth: 1951-01-27  Subjective/Objective:   POD 1  Minimum TVR and Minimum MVR conts on vent, amio drip, precedex and dopamine drip,  NCM will cont to follow as patient progresses off vent.   3/2 [place], BSN - stop amiodarone, cont to wean epi, wean milrinone, transfuse 2 units prbc's, cont lasix drip, chest tubes for one more day, inster picc , start coumadin.  NCM will cont to follow for dc needs.   3/5 [place], BSN - POD 5 Minimum TVR and Minimum MVR , conts on low dose milrinone, abla - hgb down today, post op thrombocytopenia, plts 84, he may ultimately need PPM, he ix on external pace maker now. Will cont to wean milrinone, cont coumadin, mobilize, nebs.  He plans to stay with his sister [name] at discharge, she lives in [place].  He has medication coverage and he has a PCP, Dr. Georg Ruddle at [facility] on [address]. In [place].  NCM will cont to follow for dc needs.                   Action/Plan:   Expected Discharge Date:                  Expected Discharge Plan:     In-House Referral:     Discharge planning Services  CM Consult  Post Acute Care Choice:    Choice offered to:     DME Arranged:    DME Agency:     HH Arranged:    HH Agency:     Status of Service:  In process, will continue to follow  If discussed at Long Length of Stay Meetings, dates discussed:    Additional Comments:  Zenon Mayo, RN 10/26/2016, 11:15 AM

## 2016-10-26 NOTE — Significant Event (Signed)
Received verbal orders from Dr. Roxy Manns to extubate patient and decrease milrinone drip down to 0.354mcg    Legacie Dillingham

## 2016-10-27 ENCOUNTER — Encounter (HOSPITAL_COMMUNITY): Payer: Self-pay | Admitting: Thoracic Surgery (Cardiothoracic Vascular Surgery)

## 2016-10-27 ENCOUNTER — Inpatient Hospital Stay (HOSPITAL_COMMUNITY): Payer: Medicare HMO

## 2016-10-27 LAB — CBC
HCT: 25.4 % — ABNORMAL LOW (ref 39.0–52.0)
Hemoglobin: 8.3 g/dL — ABNORMAL LOW (ref 13.0–17.0)
MCH: 32.2 pg (ref 26.0–34.0)
MCHC: 32.7 g/dL (ref 30.0–36.0)
MCV: 98.4 fL (ref 78.0–100.0)
Platelets: 89 10*3/uL — ABNORMAL LOW (ref 150–400)
RBC: 2.58 MIL/uL — ABNORMAL LOW (ref 4.22–5.81)
RDW: 14.7 % (ref 11.5–15.5)
WBC: 14.5 10*3/uL — ABNORMAL HIGH (ref 4.0–10.5)

## 2016-10-27 LAB — COOXEMETRY PANEL
CARBOXYHEMOGLOBIN: 1.1 % (ref 0.5–1.5)
Methemoglobin: 1.5 % (ref 0.0–1.5)
O2 Saturation: 60.3 %
Total hemoglobin: 8.6 g/dL — ABNORMAL LOW (ref 12.0–16.0)

## 2016-10-27 LAB — COMPREHENSIVE METABOLIC PANEL
ALBUMIN: 3.1 g/dL — AB (ref 3.5–5.0)
ALK PHOS: 36 U/L — AB (ref 38–126)
ALT: 10 U/L — AB (ref 17–63)
ANION GAP: 12 (ref 5–15)
AST: 49 U/L — ABNORMAL HIGH (ref 15–41)
BUN: 27 mg/dL — ABNORMAL HIGH (ref 6–20)
CALCIUM: 8.5 mg/dL — AB (ref 8.9–10.3)
CO2: 22 mmol/L (ref 22–32)
CREATININE: 2.39 mg/dL — AB (ref 0.61–1.24)
Chloride: 101 mmol/L (ref 101–111)
GFR calc Af Amer: 31 mL/min — ABNORMAL LOW (ref >60)
GFR calc non Af Amer: 27 mL/min — ABNORMAL LOW (ref >60)
GLUCOSE: 143 mg/dL — AB (ref 65–99)
Potassium: 4.7 mmol/L (ref 3.5–5.1)
SODIUM: 135 mmol/L (ref 135–145)
TOTAL PROTEIN: 5.9 g/dL — AB (ref 6.5–8.1)
Total Bilirubin: 1 mg/dL (ref 0.3–1.2)

## 2016-10-27 LAB — PREPARE RBC (CROSSMATCH)

## 2016-10-27 LAB — BLOOD GAS, ARTERIAL
ACID-BASE DEFICIT: 1.1 mmol/L (ref 0.0–2.0)
Bicarbonate: 23.2 mmol/L (ref 20.0–28.0)
Drawn by: 252031
O2 CONTENT: 8 L/min
O2 SAT: 93.7 %
PATIENT TEMPERATURE: 98.6
PCO2 ART: 39.3 mmHg (ref 32.0–48.0)
PO2 ART: 73.2 mmHg — AB (ref 83.0–108.0)
pH, Arterial: 7.389 (ref 7.350–7.450)

## 2016-10-27 LAB — GLUCOSE, CAPILLARY
GLUCOSE-CAPILLARY: 112 mg/dL — AB (ref 65–99)
Glucose-Capillary: 112 mg/dL — ABNORMAL HIGH (ref 65–99)
Glucose-Capillary: 115 mg/dL — ABNORMAL HIGH (ref 65–99)
Glucose-Capillary: 100 mg/dL — ABNORMAL HIGH (ref 65–99)

## 2016-10-27 MED ORDER — IPRATROPIUM-ALBUTEROL 0.5-2.5 (3) MG/3ML IN SOLN: 3.0000 mL | RESPIRATORY_TRACT | Status: DC | PRN

## 2016-10-27 MED ORDER — ENOXAPARIN SODIUM 30 MG/0.3ML ~~LOC~~ SOLN
30.0000 mg | SUBCUTANEOUS | Status: DC
  Administered 2016-10-28 – 2016-11-02 (×6): 30 mg via SUBCUTANEOUS
  Filled 2016-10-27 (×6): qty 0.3

## 2016-10-27 MED ORDER — SODIUM CHLORIDE 0.9 % IV SOLN: 250.0000 mL | INTRAVENOUS | Status: DC | PRN

## 2016-10-27 MED ORDER — SODIUM CHLORIDE 0.9% FLUSH
10.0000 mL | Freq: Two times a day (BID) | INTRAVENOUS | Status: DC
  Administered 2016-10-27: 20 mL
  Administered 2016-10-28 – 2016-11-03 (×6): 10 mL

## 2016-10-27 MED ORDER — SODIUM CHLORIDE 0.9 % IV SOLN
Freq: Once | INTRAVENOUS | Status: AC
  Administered 2016-10-27: 08:00:00 via INTRAVENOUS

## 2016-10-27 MED ORDER — ASPIRIN EC 81 MG PO TBEC
81.0000 mg | DELAYED_RELEASE_TABLET | Freq: Every day | ORAL | Status: DC
  Administered 2016-10-27 – 2016-11-04 (×9): 81 mg via ORAL
  Filled 2016-10-27 (×9): qty 1

## 2016-10-27 MED ORDER — MOVING RIGHT ALONG BOOK
Freq: Once | Status: AC
  Administered 2016-10-27: 1
  Filled 2016-10-27: qty 1

## 2016-10-27 MED ORDER — SODIUM CHLORIDE 0.9% FLUSH: 3.0000 mL | INTRAVENOUS | Status: DC | PRN

## 2016-10-27 MED ORDER — WARFARIN SODIUM 2.5 MG PO TABS
2.5000 mg | ORAL_TABLET | Freq: Every day | ORAL | Status: DC
  Administered 2016-10-27 – 2016-10-28 (×2): 2.5 mg via ORAL
  Filled 2016-10-27 (×2): qty 1

## 2016-10-27 MED ORDER — SODIUM CHLORIDE 0.9% FLUSH
10.0000 mL | INTRAVENOUS | Status: DC | PRN
  Administered 2016-10-31 – 2016-11-04 (×3): 10 mL
  Filled 2016-10-27 (×3): qty 40

## 2016-10-27 MED ORDER — ASPIRIN EC 81 MG PO TBEC: 81.0000 mg | DELAYED_RELEASE_TABLET | Freq: Every day | ORAL | Status: DC

## 2016-10-27 MED ORDER — ORAL CARE MOUTH RINSE: 15.0000 mL | Freq: Two times a day (BID) | OROMUCOSAL | Status: DC

## 2016-10-27 MED ORDER — SODIUM CHLORIDE 0.9% FLUSH
3.0000 mL | Freq: Two times a day (BID) | INTRAVENOUS | Status: DC
  Administered 2016-10-27 – 2016-10-28 (×2): 3 mL via INTRAVENOUS

## 2016-10-27 MED ORDER — IPRATROPIUM-ALBUTEROL 0.5-2.5 (3) MG/3ML IN SOLN
3.0000 mL | Freq: Three times a day (TID) | RESPIRATORY_TRACT | Status: DC
  Administered 2016-10-27 – 2016-11-02 (×17): 3 mL via RESPIRATORY_TRACT
  Filled 2016-10-27 (×17): qty 3

## 2016-10-27 MED ORDER — FOLIC ACID 1 MG PO TABS
1.0000 mg | ORAL_TABLET | Freq: Every day | ORAL | Status: DC
  Administered 2016-10-27 – 2016-11-04 (×9): 1 mg via ORAL
  Filled 2016-10-27 (×9): qty 1

## 2016-10-27 MED ORDER — VITAMIN B-1 100 MG PO TABS
100.0000 mg | ORAL_TABLET | Freq: Every day | ORAL | Status: DC
  Administered 2016-10-27 – 2016-11-04 (×9): 100 mg via ORAL
  Filled 2016-10-27 (×9): qty 1

## 2016-10-27 MED ORDER — WARFARIN - PHYSICIAN DOSING INPATIENT
Freq: Every day | Status: DC
  Administered 2016-10-29: 1
  Administered 2016-10-31 – 2016-11-01 (×2)

## 2016-10-27 MED ORDER — CHLORHEXIDINE GLUCONATE CLOTH 2 % EX PADS
6.0000 | MEDICATED_PAD | Freq: Every day | CUTANEOUS | Status: DC
  Administered 2016-10-27 – 2016-10-30 (×4): 6 via TOPICAL

## 2016-10-27 MED FILL — Sodium Bicarbonate IV Soln 8.4%: INTRAVENOUS | Qty: 50 | Status: AC

## 2016-10-27 MED FILL — Electrolyte-R (PH 7.4) Solution: INTRAVENOUS | Qty: 4000 | Status: AC

## 2016-10-27 MED FILL — Heparin Sodium (Porcine) Inj 1000 Unit/ML: INTRAMUSCULAR | Qty: 30 | Status: AC

## 2016-10-27 MED FILL — Sodium Chloride IV Soln 0.9%: INTRAVENOUS | Qty: 3000 | Status: AC

## 2016-10-27 MED FILL — Mannitol IV Soln 20%: INTRAVENOUS | Qty: 500 | Status: AC

## 2016-10-27 MED FILL — Lidocaine HCl IV Inj 20 MG/ML: INTRAVENOUS | Qty: 5 | Status: AC

## 2016-10-27 NOTE — Progress Notes (Signed)
Peripherally Inserted Central Catheter/Midline Placement  The IV Nurse has discussed with the patient and/or persons authorized to consent for the patient, the purpose of this procedure and the potential benefits and risks involved with this procedure.  The benefits include less needle sticks, lab draws from the catheter, and the patient may be discharged home with the catheter. Risks include, but not limited to, infection, bleeding, blood clot (thrombus formation), and puncture of an artery; nerve damage and irregular heartbeat and possibility to perform a PICC exchange if needed/ordered by physician.  Alternatives to this procedure were also discussed.  Bard Power PICC patient education guide, fact sheet on infection prevention and patient information card has been provided to patient /or left at bedside.    PICC/Midline Placement Documentation        Aaron Bennett 10/27/2016, 11:17 AM

## 2016-10-27 NOTE — Progress Notes (Addendum)
[address]       [place] [address]             [phone]        CARDIOTHORACIC SURGERY PROGRESS NOTE   R2 Days Post-Op Procedure(s) (LRB): MINIMALLY INVASIVE TRICUSPID VALVE REPAIR (Right) TRANSESOPHAGEAL ECHOCARDIOGRAM (TEE) (N/A) MINIMALLY INVASIVE MITRAL VALVE (MV) REPLACEMENT (Right)  Subjective: Looks good and feels well.  Minimal soreness. Breathing comfortably on HFNC.  Denies SOB.  Wants to get up and move  Objective: Vital signs: BP Readings from Last 1 Encounters:  10/27/16 118/67   Pulse Readings from Last 1 Encounters:  10/27/16 93   Resp Readings from Last 1 Encounters:  10/27/16 (!) 22   Temp Readings from Last 1 Encounters:  10/27/16 98.5 F (36.9 C) (Oral)    Hemodynamics: PAP: (32-48)/(16-27) 46/21 CO:  [4.4 L/min-6.3 L/min] 6.3 L/min CI:  [2.3 L/min/m2-3.4 L/min/m2] 3.4 L/min/m2  Mixed venous co-ox 60.3%   Physical Exam:  Rhythm:   Slow junctional rhythm.  DDD pacing  Breath sounds: Fairly clear, few rhonchi, no wheezing  Heart sounds:  RRR w/out murmur  Incisions:  Dressings dry, intact  Abdomen:  Soft, non-distended, non-tender  Extremities:  Warm, well-perfused  Chest tubes:  low volume thin serosanguinous output, no air leak    Intake/Output from previous day: 03/01 0701 - 03/02 0700 In: 2963.2 [P.O.:60; I.V.:2533.2; NG/GT:270; IV Piggyback:100] Out: 4370 [Urine:3720; Emesis/NG output:410; Chest Tube:240] Intake/Output this shift: No intake/output data recorded.  Lab Results:  CBC: Recent Labs  10/26/16 1645 10/26/16 1653 10/27/16 0353  WBC 13.4*  --  14.5*  HGB 9.2* 9.9* 8.3*  HCT 28.3* 29.0* 25.4*  PLT 89*  --  89*    BMET:  Recent Labs  10/26/16 0400 10/26/16 1645 10/26/16 1653 10/27/16 0353  NA 139  --  139 135  K 3.5  --  4.7 4.7  CL 110  --   --  101  CO2 23  --   --  22  GLUCOSE 122*  --  140* 143*  BUN 18  --   --  27*  CREATININE 1.75* 2.11*  --  2.39*  CALCIUM 9.1  --   --   8.5*     PT/INR:   Recent Labs  10/25/16 1600  LABPROT 18.8*  INR 1.55    CBG (last 3)   Recent Labs  10/26/16 2350 10/27/16 0353 10/27/16 0803  GLUCAP 132* 112* 115*    ABG    Component Value Date/Time   PHART 7.389 10/27/2016 0302   PCO2ART 39.3 10/27/2016 0302   PO2ART 73.2 (L) 10/27/2016 0302   HCO3 23.2 10/27/2016 0302   TCO2 22 10/26/2016 1650   ACIDBASEDEF 1.1 10/27/2016 0302   O2SAT 60.3 10/27/2016 0410    CXR: PORTABLE CHEST 1 VIEW  COMPARISON:  10/26/2016.  FINDINGS: Interim extubation and removal of Swan-Ganz catheter. Left IJ sheath, right chest tube, and mediastinal drainage catheter stable position . Prior cardiac valve replacement. Cardiomegaly with bilateral pulmonary infiltrates consistent pulmonary edema. Improvement from prior exam. Small right pleural effusion. Improved from prior exam . No pneumothorax.  IMPRESSION: 1. Interim extubation and removal of NG tube and Swan-Ganz catheter. Remaining lines and tubes including right chest tube in stable position. No pneumothorax.  2. Prior cardiac valve replacements. Cardiomegaly with bilateral pulmonary infiltrates consistent pulmonary edema. Small right pleural effusion. These findings have improved from prior exam .   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% Severe pulmonary  hypertension preop - PA pressures much lower since surgery COPD w/ longstanding tobacco abuse Expected post op acute blood loss anemia with preop chronic anemia, Hgb down 8.3 this morning CKD with acute renal insufficiency, creatinine up to 2.4 this morning, likely due to acute kidney injury secondary to prerenal azotemia +/- ATN from surgery, UOP good w/ I/O's 1.4 liters negative yesterday Post op thrombocytopenia, platelet count 89k stable   Stop amiodarone and continue pacing for now  Continue to wean Epi slowly  Wean milrinone very slowly  Transfuse 2 units PRBC's for increased O2 carrying capacity  Continue lasix drip  Mobilize  Pulm toilet  Nebs  Leave chest tubes at least 1 more day  Insert PICC line and d/c sleeve  Start coumadin   [name], MD 10/27/2016 8:27 AM

## 2016-10-27 NOTE — Significant Event (Signed)
HR in the 90-100, pacing DDD @80 , not capturing appropriately for atrial despite changes made to MA and sensitivity. Turned pacing VVI @80 . Underlying rhythm is junctional in the mid 40s when RN checked at this time.   Kyler Lerette

## 2016-10-27 NOTE — Progress Notes (Signed)
CT surgery p.m. Rounds  Patient up in the hallway walking Diuresis satisfactory on Lasix drip Pain well controlled Coumadin for new MVR Dopamine weaned off, continue milrinone

## 2016-10-28 ENCOUNTER — Inpatient Hospital Stay (HOSPITAL_COMMUNITY): Payer: Medicare HMO

## 2016-10-28 LAB — BPAM RBC
BLOOD PRODUCT EXPIRATION DATE: 201803212359
Blood Product Expiration Date: 201803252359
ISSUE DATE / TIME: 201803020811
ISSUE DATE / TIME: 201803021037
UNIT TYPE AND RH: 600
Unit Type and Rh: 600

## 2016-10-28 LAB — CBC
HCT: 31.4 % — ABNORMAL LOW (ref 39.0–52.0)
HEMOGLOBIN: 10.6 g/dL — AB (ref 13.0–17.0)
MCH: 31.6 pg (ref 26.0–34.0)
MCHC: 33.8 g/dL (ref 30.0–36.0)
MCV: 93.7 fL (ref 78.0–100.0)
Platelets: 73 10*3/uL — ABNORMAL LOW (ref 150–400)
RBC: 3.35 MIL/uL — AB (ref 4.22–5.81)
RDW: 16.4 % — ABNORMAL HIGH (ref 11.5–15.5)
WBC: 13.7 10*3/uL — ABNORMAL HIGH (ref 4.0–10.5)

## 2016-10-28 LAB — TYPE AND SCREEN
ABO/RH(D): A NEG
Antibody Screen: NEGATIVE
UNIT DIVISION: 0
Unit division: 0

## 2016-10-28 LAB — BASIC METABOLIC PANEL
ANION GAP: 12 (ref 5–15)
BUN: 36 mg/dL — ABNORMAL HIGH (ref 6–20)
CHLORIDE: 95 mmol/L — AB (ref 101–111)
CO2: 25 mmol/L (ref 22–32)
CREATININE: 2.61 mg/dL — AB (ref 0.61–1.24)
Calcium: 8.6 mg/dL — ABNORMAL LOW (ref 8.9–10.3)
GFR calc Af Amer: 28 mL/min — ABNORMAL LOW (ref >60)
GFR calc non Af Amer: 24 mL/min — ABNORMAL LOW (ref >60)
Glucose, Bld: 111 mg/dL — ABNORMAL HIGH (ref 65–99)
Potassium: 4.3 mmol/L (ref 3.5–5.1)
Sodium: 132 mmol/L — ABNORMAL LOW (ref 135–145)

## 2016-10-28 LAB — COOXEMETRY PANEL
Carboxyhemoglobin: 1.6 % — ABNORMAL HIGH (ref 0.5–1.5)
Methemoglobin: 1.2 % (ref 0.0–1.5)
O2 SAT: 55.9 %
Total hemoglobin: 10.9 g/dL — ABNORMAL LOW (ref 12.0–16.0)

## 2016-10-28 LAB — PROTIME-INR
INR: 1.35
Prothrombin Time: 16.8 seconds — ABNORMAL HIGH (ref 11.4–15.2)

## 2016-10-28 MED ORDER — DEXTROSE 5 % IV SOLN
1.0000 g | Freq: Two times a day (BID) | INTRAVENOUS | Status: DC
  Administered 2016-10-28 – 2016-10-29 (×4): 1 g via INTRAVENOUS
  Filled 2016-10-28 (×5): qty 1

## 2016-10-28 MED ORDER — DOPAMINE-DEXTROSE 3.2-5 MG/ML-% IV SOLN
2.5000 ug/kg/min | INTRAVENOUS | Status: DC
  Administered 2016-10-28: 2.5 ug/kg/min via INTRAVENOUS
  Filled 2016-10-28 (×2): qty 250

## 2016-10-28 MED ORDER — FUROSEMIDE 10 MG/ML IJ SOLN
40.0000 mg | Freq: Two times a day (BID) | INTRAMUSCULAR | Status: DC
  Administered 2016-10-28 – 2016-10-29 (×3): 40 mg via INTRAVENOUS
  Filled 2016-10-28 (×3): qty 4

## 2016-10-28 NOTE — Progress Notes (Signed)
3 Days Post-Op Procedure(s) (LRB): MINIMALLY INVASIVE TRICUSPID VALVE REPAIR (Right) TRANSESOPHAGEAL ECHOCARDIOGRAM (TEE) (N/A) MINIMALLY INVASIVE MITRAL VALVE (MV) REPLACEMENT (Right) Subjective: Walking in hall Infiltrate/ edema R lung - add fortaz Lasix drip off for rising creat 2.6 HR 50s dissociated- now AV paced 84/min  Objective: Vital signs in last 24 hours: Temp:  [97.7 F (36.5 C)-100.2 F (37.9 C)] 98.4 F (36.9 C) (03/03 0826) Pulse Rate:  [59-117] 100 (03/03 0700) Cardiac Rhythm: A-V Sequential paced (03/02 2030) Resp:  [13-35] 13 (03/03 0700) BP: (104-134)/(62-91) 117/81 (03/03 0700) SpO2:  [81 %-100 %] 100 % (03/03 0827) Arterial Line BP: (112-160)/(54-78) 118/71 (03/02 1330) Weight:  [163 lb 5.8 oz (74.1 kg)] 163 lb 5.8 oz (74.1 kg) (03/03 0321)  Hemodynamic parameters for last 24 hours:    Intake/Output from previous day: 03/02 0701 - 03/03 0700 In: 2021.4 [P.O.:600; I.V.:756.4; Blood:615; IV Piggyback:50] Out: 2195 [Urine:1945; Chest Tube:250] Intake/Output this shift: Total I/O In: -  Out: 200 [Urine:200]  Incisions clean Had BM  Lab Results:  Recent Labs  10/27/16 0353 10/28/16 0312  WBC 14.5* 13.7*  HGB 8.3* 10.6*  HCT 25.4* 31.4*  PLT 89* 73*   BMET:  Recent Labs  10/27/16 0353 10/28/16 0312  NA 135 132*  K 4.7 4.3  CL 101 95*  CO2 22 25  GLUCOSE 143* 111*  BUN 27* 36*  CREATININE 2.39* 2.61*  CALCIUM 8.5* 8.6*    PT/INR:  Recent Labs  10/28/16 0312  LABPROT 16.8*  INR 1.35   ABG    Component Value Date/Time   PHART 7.389 10/27/2016 0302   HCO3 23.2 10/27/2016 0302   TCO2 22 10/26/2016 1650   ACIDBASEDEF 1.1 10/27/2016 0302   O2SAT 55.9 10/28/2016 0325   CBG (last 3)   Recent Labs  10/27/16 0803 10/27/16 1227 10/27/16 1539  GLUCAP 115* 112* 100*    Assessment/Plan: S/P Procedure(s) (LRB): MINIMALLY INVASIVE TRICUSPID VALVE REPAIR (Right) TRANSESOPHAGEAL ECHOCARDIOGRAM (TEE) (N/A) MINIMALLY INVASIVE  MITRAL VALVE (MV) REPLACEMENT (Right) Mobilize Diuresis follow creat   LOS: 3 days    Aaron Bennett 10/28/2016

## 2016-10-28 NOTE — Progress Notes (Signed)
CT Surgery PM  better uirine output on low dose dopamine Walked > 300 feet easily Foley out

## 2016-10-28 NOTE — Plan of Care (Signed)
Problem: Bowel/Gastric: Goal: Gastrointestinal status for postoperative course will improve Outcome: Progressing Pt had BM today  Problem: Cardiac: Goal: Hemodynamic stability will improve Outcome: Progressing Pt is 100% pacing with temporary wires, good function Goal: Ability to maintain an adequate cardiac output will improve Outcome: Progressing Pt remains on Milrinone, tolerating activity   Problem: Nutritional: Goal: Risk for body nutrition deficit will decrease Outcome: Progressing Pt with fair appetite today, ate some of each meal  Problem: Tissue Perfusion: Goal: Risk of venous thrombosis will decrease Outcome: Progressing Pt moves positions frequently, does not want to wear SCDs though  Problem: Urinary Elimination: Goal: Ability to achieve and maintain adequate renal perfusion and functioning will improve Outcome: Progressing Urine output has improved, on Dopamine gtt

## 2016-10-29 ENCOUNTER — Inpatient Hospital Stay (HOSPITAL_COMMUNITY): Payer: Medicare HMO

## 2016-10-29 LAB — BASIC METABOLIC PANEL
Anion gap: 9 (ref 5–15)
BUN: 43 mg/dL — ABNORMAL HIGH (ref 6–20)
CO2: 27 mmol/L (ref 22–32)
Calcium: 8.2 mg/dL — ABNORMAL LOW (ref 8.9–10.3)
Chloride: 96 mmol/L — ABNORMAL LOW (ref 101–111)
Creatinine, Ser: 2.37 mg/dL — ABNORMAL HIGH (ref 0.61–1.24)
GFR calc Af Amer: 31 mL/min — ABNORMAL LOW (ref >60)
GFR calc non Af Amer: 27 mL/min — ABNORMAL LOW (ref >60)
Glucose, Bld: 130 mg/dL — ABNORMAL HIGH (ref 65–99)
Potassium: 3.6 mmol/L (ref 3.5–5.1)
Sodium: 132 mmol/L — ABNORMAL LOW (ref 135–145)

## 2016-10-29 LAB — CBC
HCT: 26.8 % — ABNORMAL LOW (ref 39.0–52.0)
Hemoglobin: 8.8 g/dL — ABNORMAL LOW (ref 13.0–17.0)
MCH: 30.7 pg (ref 26.0–34.0)
MCHC: 32.8 g/dL (ref 30.0–36.0)
MCV: 93.4 fL (ref 78.0–100.0)
Platelets: 74 10*3/uL — ABNORMAL LOW (ref 150–400)
RBC: 2.87 MIL/uL — ABNORMAL LOW (ref 4.22–5.81)
RDW: 15.4 % (ref 11.5–15.5)
WBC: 6.4 10*3/uL (ref 4.0–10.5)

## 2016-10-29 LAB — COOXEMETRY PANEL
CARBOXYHEMOGLOBIN: 2.1 % — AB (ref 0.5–1.5)
METHEMOGLOBIN: 1.2 % (ref 0.0–1.5)
O2 SAT: 62.8 %
Total hemoglobin: 9.2 g/dL — ABNORMAL LOW (ref 12.0–16.0)

## 2016-10-29 LAB — PROTIME-INR
INR: 1.15
Prothrombin Time: 14.8 seconds (ref 11.4–15.2)

## 2016-10-29 MED ORDER — POTASSIUM CHLORIDE 2 MEQ/ML IV SOLN
30.0000 meq | Freq: Once | INTRAVENOUS | Status: AC
  Administered 2016-10-29: 30 meq via INTRAVENOUS
  Filled 2016-10-29: qty 15

## 2016-10-29 MED ORDER — FUROSEMIDE 10 MG/ML IJ SOLN
40.0000 mg | Freq: Every day | INTRAMUSCULAR | Status: DC
  Administered 2016-10-30 – 2016-11-02 (×4): 40 mg via INTRAVENOUS
  Filled 2016-10-29 (×4): qty 4

## 2016-10-29 MED ORDER — WARFARIN SODIUM 3 MG PO TABS: 4.0000 mg | ORAL_TABLET | Freq: Every day | ORAL | Status: DC

## 2016-10-29 MED ORDER — GUAIFENESIN ER 600 MG PO TB12
600.0000 mg | ORAL_TABLET | Freq: Two times a day (BID) | ORAL | Status: AC
  Administered 2016-10-29 – 2016-11-03 (×12): 600 mg via ORAL
  Filled 2016-10-29 (×12): qty 1

## 2016-10-29 MED ORDER — WARFARIN SODIUM 5 MG PO TABS
5.0000 mg | ORAL_TABLET | Freq: Every day | ORAL | Status: DC
  Administered 2016-10-29 – 2016-11-01 (×4): 5 mg via ORAL
  Filled 2016-10-29 (×4): qty 1

## 2016-10-29 NOTE — Progress Notes (Signed)
4 Days Post-Op Procedure(s) (LRB): MINIMALLY INVASIVE TRICUSPID VALVE REPAIR (Right) TRANSESOPHAGEAL ECHOCARDIOGRAM (TEE) (N/A) MINIMALLY INVASIVE MITRAL VALVE (MV) REPLACEMENT (Right) Subjective: Wet cough ,  R infiltrate on fortaz, mucinex Dissociated rhythm - cont AV pacing Renal fx better - almost to baseline wt - lasix 40 mg daily Wean dopamine today- mil at .25 with co-ox 60% Objective: Vital signs in last 24 hours: Temp:  [97.5 F (36.4 C)-99 F (37.2 C)] 98.3 F (36.8 C) (03/04 0753) Pulse Rate:  [70-118] 84 (03/04 0900) Cardiac Rhythm: (P) A-V Sequential paced (03/04 0800) Resp:  [12-26] 19 (03/04 0900) BP: (103-139)/(64-97) 134/74 (03/04 0900) SpO2:  [83 %-99 %] 92 % (03/04 0900) Weight:  [157 lb 6.5 oz (71.4 kg)] 157 lb 6.5 oz (71.4 kg) (03/04 0600)  Hemodynamic parameters for last 24 hours:  stable  Intake/Output from previous day: 03/03 0701 - 03/04 0700 In: 1326.1 [P.O.:480; I.V.:481.1; IV Piggyback:365] Out: 2840 [Urine:2800; Chest Tube:40] Intake/Output this shift: Total I/O In: 279.6 [P.O.:240; I.V.:39.6] Out: 400 [Urine:400]  Incisions clean Coarse breath sounds on R + BM Lab Results:  Recent Labs  10/28/16 0312 10/29/16 0345  WBC 13.7* 6.4  HGB 10.6* 8.8*  HCT 31.4* 26.8*  PLT 73* 74*   BMET:  Recent Labs  10/28/16 0312 10/29/16 0345  NA 132* 132*  K 4.3 3.6  CL 95* 96*  CO2 25 27  GLUCOSE 111* 130*  BUN 36* 43*  CREATININE 2.61* 2.37*  CALCIUM 8.6* 8.2*    PT/INR:  Recent Labs  10/29/16 0345  LABPROT 14.8  INR 1.15   ABG    Component Value Date/Time   PHART 7.389 10/27/2016 0302   HCO3 23.2 10/27/2016 0302   TCO2 22 10/26/2016 1650   ACIDBASEDEF 1.1 10/27/2016 0302   O2SAT 62.8 10/29/2016 0350   CBG (last 3)   Recent Labs  10/27/16 0803 10/27/16 1227 10/27/16 1539  GLUCAP 115* 112* 100*    Assessment/Plan: S/P Procedure(s) (LRB): MINIMALLY INVASIVE TRICUSPID VALVE REPAIR (Right) TRANSESOPHAGEAL  ECHOCARDIOGRAM (TEE) (N/A) MINIMALLY INVASIVE MITRAL VALVE (MV) REPLACEMENT (Right) Wean inotropes slowly Cont pacing  INR  Sub therapeutic- cont lovenox   LOS: 4 days    Aaron Bennett 10/29/2016

## 2016-10-29 NOTE — Progress Notes (Signed)
CT surgery p.m. Rounds  Stable day Remains AV paced Dopamine weaned off with continued adequate urine output Walking hallway Continue with Coumadin load

## 2016-10-30 ENCOUNTER — Inpatient Hospital Stay (HOSPITAL_COMMUNITY): Payer: Medicare HMO

## 2016-10-30 DIAGNOSIS — N189 Chronic kidney disease, unspecified: Secondary | ICD-10-CM

## 2016-10-30 DIAGNOSIS — I442 Atrioventricular block, complete: Secondary | ICD-10-CM

## 2016-10-30 DIAGNOSIS — I340 Nonrheumatic mitral (valve) insufficiency: Secondary | ICD-10-CM

## 2016-10-30 DIAGNOSIS — I509 Heart failure, unspecified: Secondary | ICD-10-CM

## 2016-10-30 LAB — ECHOCARDIOGRAM COMPLETE
Height: 71 in
Weight: 2465.62 oz

## 2016-10-30 LAB — COOXEMETRY PANEL
Carboxyhemoglobin: 2.4 % — ABNORMAL HIGH (ref 0.5–1.5)
Methemoglobin: 0.8 % (ref 0.0–1.5)
O2 SAT: 60.1 %
Total hemoglobin: 8.9 g/dL — ABNORMAL LOW (ref 12.0–16.0)

## 2016-10-30 LAB — BASIC METABOLIC PANEL
Anion gap: 7 (ref 5–15)
BUN: 35 mg/dL — ABNORMAL HIGH (ref 6–20)
CO2: 27 mmol/L (ref 22–32)
Calcium: 8.2 mg/dL — ABNORMAL LOW (ref 8.9–10.3)
Chloride: 100 mmol/L — ABNORMAL LOW (ref 101–111)
Creatinine, Ser: 1.85 mg/dL — ABNORMAL HIGH (ref 0.61–1.24)
GFR calc Af Amer: 42 mL/min — ABNORMAL LOW (ref >60)
GFR calc non Af Amer: 37 mL/min — ABNORMAL LOW (ref >60)
Glucose, Bld: 99 mg/dL (ref 65–99)
Potassium: 3.7 mmol/L (ref 3.5–5.1)
Sodium: 134 mmol/L — ABNORMAL LOW (ref 135–145)

## 2016-10-30 LAB — CBC
HCT: 25 % — ABNORMAL LOW (ref 39.0–52.0)
Hemoglobin: 8.4 g/dL — ABNORMAL LOW (ref 13.0–17.0)
MCH: 31.9 pg (ref 26.0–34.0)
MCHC: 33.6 g/dL (ref 30.0–36.0)
MCV: 95.1 fL (ref 78.0–100.0)
Platelets: 84 10*3/uL — ABNORMAL LOW (ref 150–400)
RBC: 2.63 MIL/uL — ABNORMAL LOW (ref 4.22–5.81)
RDW: 15.3 % (ref 11.5–15.5)
WBC: 7.2 10*3/uL (ref 4.0–10.5)

## 2016-10-30 LAB — URIC ACID: URIC ACID, SERUM: 10.9 mg/dL — AB (ref 4.4–7.6)

## 2016-10-30 LAB — PROTIME-INR
INR: 1.25
Prothrombin Time: 15.8 seconds — ABNORMAL HIGH (ref 11.4–15.2)

## 2016-10-30 MED ORDER — POTASSIUM CHLORIDE CRYS ER 20 MEQ PO TBCR
40.0000 meq | EXTENDED_RELEASE_TABLET | Freq: Once | ORAL | Status: AC
  Administered 2016-10-30: 40 meq via ORAL
  Filled 2016-10-30: qty 2

## 2016-10-30 MED ORDER — COLCHICINE 0.6 MG PO TABS
0.6000 mg | ORAL_TABLET | Freq: Every day | ORAL | Status: DC
  Administered 2016-10-30: 0.6 mg via ORAL
  Filled 2016-10-30: qty 1

## 2016-10-30 MED ORDER — WARFARIN VIDEO: Freq: Once | Status: DC

## 2016-10-30 MED ORDER — POLYSACCHARIDE IRON COMPLEX 150 MG PO CAPS
150.0000 mg | ORAL_CAPSULE | Freq: Every day | ORAL | Status: DC
  Administered 2016-10-30 – 2016-11-04 (×6): 150 mg via ORAL
  Filled 2016-10-30 (×6): qty 1

## 2016-10-30 MED ORDER — SODIUM CHLORIDE 0.9 % IV SOLN
30.0000 meq | Freq: Once | INTRAVENOUS | Status: AC
  Administered 2016-10-30: 30 meq via INTRAVENOUS
  Filled 2016-10-30: qty 15

## 2016-10-30 MED ORDER — ALLOPURINOL 300 MG PO TABS
150.0000 mg | ORAL_TABLET | Freq: Every day | ORAL | Status: DC
  Administered 2016-10-30: 150 mg via ORAL
  Filled 2016-10-30: qty 1

## 2016-10-30 MED ORDER — PATIENT'S GUIDE TO USING COUMADIN BOOK
Freq: Once | Status: DC
  Filled 2016-10-30: qty 1

## 2016-10-30 NOTE — Progress Notes (Signed)
Echocardiogram 2D Echocardiogram has been performed.  Aaron Bennett 10/30/2016, 11:49 AM

## 2016-10-30 NOTE — Progress Notes (Signed)
[address]       [place] [address]             [phone]        CARDIOTHORACIC SURGERY PROGRESS NOTE   R5 Days Post-Op Procedure(s) (LRB): MINIMALLY INVASIVE TRICUSPID VALVE REPAIR (Right) TRANSESOPHAGEAL ECHOCARDIOGRAM (TEE) (N/A) MINIMALLY INVASIVE MITRAL VALVE (MV) REPLACEMENT (Right)  Subjective: Feels well except for painful right 1st and 2nd MCP joints c/w likely acute gout flair.  No SOB  Objective: Vital signs: BP Readings from Last 1 Encounters:  10/30/16 110/74   Pulse Readings from Last 1 Encounters:  10/30/16 88   Resp Readings from Last 1 Encounters:  10/30/16 18   Temp Readings from Last 1 Encounters:  10/30/16 98.5 F (36.9 C) (Oral)    Hemodynamics:   Mixed venous co-ox 60.1%   Physical Exam:  Rhythm:   CHB - DDD pacing  Breath sounds: Mild bibasilar inspiratory crackles R>L  Heart sounds:  RRR w/out murmur  Incisions:  Clean and dry  Abdomen:  Soft, non-distended, non-tender  Extremities:  Warm, well-perfused   Intake/Output from previous day: 03/04 0701 - 03/05 0700 In: 1422.4 [P.O.:720; I.V.:337.4; IV Piggyback:365] Out: 2050 [Urine:2050] Intake/Output this shift: Total I/O In: -  Out: 300 [Urine:300]  Lab Results:  CBC: Recent Labs  10/29/16 0345 10/30/16 0420  WBC 6.4 7.2  HGB 8.8* 8.4*  HCT 26.8* 25.0*  PLT 74* 84*    BMET:  Recent Labs  10/29/16 0345 10/30/16 0420  NA 132* 134*  K 3.6 3.7  CL 96* 100*  CO2 27 27  GLUCOSE 130* 99  BUN 43* 35*  CREATININE 2.37* 1.85*  CALCIUM 8.2* 8.2*     PT/INR:   Recent Labs  10/30/16 0420  LABPROT 15.8*  INR 1.25    CBG (last 3)   Recent Labs  10/27/16 1227 10/27/16 1539  GLUCAP 112* 100*    ABG    Component Value Date/Time   PHART 7.389 10/27/2016 0302   PCO2ART 39.3 10/27/2016 0302   PO2ART 73.2 (L) 10/27/2016 0302   HCO3 23.2 10/27/2016 0302   TCO2 22 10/26/2016 1650   ACIDBASEDEF 1.1 10/27/2016 0302   O2SAT 60.1  10/30/2016 0415    CXR: PORTABLE CHEST 1 VIEW  COMPARISON:  Portable chest x-ray of October 29, 2016  FINDINGS: There is persistent subcutaneous emphysema on the right. No definite right-sided pneumothorax nor pneumomediastinum is observed. There is a small right pleural effusion. The interstitial markings of both lungs are increased. Increased density in the right lower lung persists. The cardiac silhouette remains enlarged. The pulmonary vascularity is more engorged than on the previous study. There is calcification in the wall of the aortic arch. The bony thorax exhibits no acute abnormality.  IMPRESSION: Persistent right-sided subcutaneous emphysema without definite pneumothorax or pneumomediastinum. Slight interval increase conspicuity of the pulmonary interstitium suggests low-grade interstitial edema. Persistent right lower lobe atelectasis.  Thoracic aortic atherosclerosis.   Electronically Signed   By: David  [place]ique M.D.   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% - weight now back to preop baseline, mild interstitial edema on CXR Severe pulmonary hypertension preop - PA pressures much lower since surgery COPD w/ longstanding tobacco abuse Expected post op acute blood loss anemia with preop chronic anemia, Hgb down slightly 8.4 this morning CKD with acute renal insufficiency, creatinine down to 1.85 this morning, likely due to acute kidney injury  secondary to prerenal azotemia +/- ATN from surgery, UOP good w/ I/O's 0.6L negative yesterday Post op thrombocytopenia, platelet count 84k stable Likely acute gout flair Afebrile w/ normal WBC and no infiltrates on CXR - no findings suggestive of pneumonia   Consult EPS team - may ultimately need PPM - consider BiV pacer ICD  Check ECHO f/u LV function  Continue to wean milrinone very slowly  Mobilize  Pulm toilet  Nebs  Stop antibiotics  Continue coumadin  Add colchicine  Transfer step down  [name], MD 10/30/2016 9:00 AM

## 2016-10-30 NOTE — Consult Note (Addendum)
ELECTROPHYSIOLOGY CONSULT NOTE    Patient ID: Aaron Bennett MRN: [id number], DOB/AGE: 1951/06/11 66 y.o.  Admit date: 10/25/2016 Date of Consult: 10/30/2016   Primary Physician: Pcp Not In System Primary Cardiologist: Dr. Martinique Requesting MD: Dr. Roxy Manns  Reason for Consultation: post-op CHB  HPI: Wassim Sholler is a 66 y.o. male with PMHx of HTN, HLD, RBBB, prostate CA, CKD stage III, and h/oalcohol abuse, tobacco abuse (none since July/August 2017). EF 30-35% at echo during admit for CHF to [facility] [id number], complicated by ETOH withdrawal and sz, >> 04/27/2016 admit to [facility] for CHF, EF now 50-55% w/ severe MR,  >> R/L heart cath noted no significant CAD, mod-severe p.HTN and  severe MR confirmed by TEE and referred to CTS.   He is now s/p minimally invasive MVReplacement (bioprosthetic) and TV repair, today is POD #5 with persistent CHB under his temp pacing.  LABS: K+ 3.7 BUN/Mag 35/1.85 WBC 7.2 H/H 8.4/25.0 plts 84  He reports feeling this afternoon, minimal post-op discomfort only, denies SOB, was told he may need a pacer but says he feels to good to believe it.   Past Medical History:  Diagnosis Date  . CHF (congestive heart failure) ([place])    a. 04/2016: echo with EF of 50-55%, previously reported 30-35% by outside records.  . Chronic combined systolic and diastolic congestive heart failure ([place])   . CKD (chronic kidney disease) stage 3, GFR 30-59 ml/min   . H/O ETOH abuse   . Hypertension   . Mitral regurgitation    a. severe by echo in 04/2016  . Prostate cancer ([place])   . 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 APPENDECTOMY    . CARDIAC CATHETERIZATION N/A 09/06/2016   Procedure: Right/Left Heart Cath and Coronary Angiography;   Surgeon: Peter M Martinique, MD;  Location: [place] [facility];  Service: Cardiovascular;  Laterality: N/A;  . MINIMALLY INVASIVE TRICUSPID VALVE REPAIR Right 10/25/2016   Procedure: MINIMALLY INVASIVE TRICUSPID VALVE REPAIR;  Surgeon: [name], MD;  Location: [place];  Service: Open Heart Surgery;  Laterality: Right;  . MITRAL VALVE REPLACEMENT Right 10/25/2016   Procedure: MINIMALLY INVASIVE MITRAL VALVE (MV) REPLACEMENT;  Surgeon: [name], MD;  Location: [place];  Service: Open Heart Surgery;  Laterality: Right;  . PROSTATE SURGERY    . TEE WITHOUT CARDIOVERSION N/A 09/06/2016   Procedure: TRANSESOPHAGEAL ECHOCARDIOGRAM (TEE);  Surgeon: Thayer Headings, MD;  Location: [place];  Service: Cardiovascular;  Laterality: N/A;  . TEE WITHOUT CARDIOVERSION N/A 10/25/2016   Procedure: TRANSESOPHAGEAL ECHOCARDIOGRAM (TEE);  Surgeon: [name], MD;  Location: [place];  Service: Open Heart Surgery;  Laterality: N/A;     Prescriptions Prior to Admission  Medication Sig Dispense Refill Last Dose  . aspirin EC 81 MG tablet Take 1 tablet (81 mg total) by mouth daily. 30 tablet 11 10/17/2016  . carvedilol (COREG) 6.25 MG tablet Take 1 tablet (6.25 mg total) by mouth 2 (two) times daily with a meal. 60 tablet 6 10/25/2016 at 0600  . folic acid (FOLVITE) 1 MG tablet Take 1 tablet (1 mg total) by mouth daily. 30 tablet 11 10/24/2016 at Unknown time  . furosemide (LASIX) 40 MG tablet Take 1 tablet (40 mg total) by mouth daily. 30 tablet 6  10/24/2016 at Unknown time  . hydrALAZINE (APRESOLINE) 10 MG tablet Take 1 tablet (10 mg total) by mouth every 8 (eight) hours. (Patient taking differently: Take 10 mg by mouth every 8 (eight) hours as needed. ) 90 tablet 6 10/25/2016 at 0600  . isosorbide mononitrate (IMDUR) 30 MG 24 hr tablet Take 1 tablet (30 mg total) by mouth daily. 30 tablet 11 10/24/2016 at Unknown time  . Multiple Vitamin (MULTIVITAMIN) tablet Take 1 tablet by mouth daily.   10/24/2016 at Unknown  time  . thiamine (VITAMIN B-1) 100 MG tablet Take 1 tablet (100 mg total) by mouth daily. 30 tablet 11 10/24/2016 at Unknown time    Inpatient Medications:  . acetaminophen  1,000 mg Oral Q6H  . allopurinol  150 mg Oral Daily  . aspirin EC  81 mg Oral Daily  . bisacodyl  10 mg Oral Daily   Or  . bisacodyl  10 mg Rectal Daily  . Chlorhexidine Gluconate Cloth  6 each Topical Daily  . colchicine  0.6 mg Oral Daily  . docusate sodium  200 mg Oral Daily  . enoxaparin (LOVENOX) injection  30 mg Subcutaneous Q24H  . folic acid  1 mg Oral Daily  . furosemide  40 mg Intravenous Daily  . guaiFENesin  600 mg Oral BID  . ipratropium-albuterol  3 mL Nebulization TID  . iron polysaccharides  150 mg Oral Daily  . pantoprazole  40 mg Oral Daily  . sodium chloride flush  10-40 mL Intracatheter Q12H  . thiamine  100 mg Oral Daily  . warfarin  5 mg Oral q1800  . Warfarin - Physician Dosing Inpatient   Does not apply q1800    Allergies:  Allergies  Allergen Reactions  . No Known Allergies     Social History   Social History  . Marital status: Single    Spouse name: N/A  . Number of children: N/A  . Years of education: N/A   Occupational History  . Not on file.   Social History Main Topics  . Smoking status: Former Smoker    Packs/day: 1.00    Years: 50.00    Quit date: 04/16/2016  . Smokeless tobacco: Never Used  . Alcohol use No     Comment: no alcohol since 03/2016  . Drug use: No  . Sexual activity: Not on file   Other Topics Concern  . Not on file   Social History Narrative  . No narrative on file     Family History  Problem Relation Age of Onset  . Lung disease Mother   . Stroke Father 64  . Yves Dill Parkinson White syndrome Brother      Review of Systems: All other systems reviewed and are otherwise negative except as noted above.  Physical Exam: Vitals:   10/30/16 1200 10/30/16 1300 10/30/16 1306 10/30/16 1347  BP: 121/87 130/79    Pulse: 92 91    Resp: (!)  29 (!) 24    Temp:   98.8 F (37.1 C)   TempSrc:   Oral   SpO2: 90% (!) 88%  94%  Weight:      Height:        GEN- The patient is well appearing, alert and oriented x 3 today.   HEENT: normocephalic, atraumatic; sclera clear, conjunctiva pink; hearing intact; oropharynx clear; neck supple, no JVP Lymph- no cervical lymphadenopathy Lungs- CTA b/l, normal work of breathing.  No wheezes, rales, rhonchi Heart- IRRR, no murmurs, rubs or gallops, PMI  not laterally displaced GI- soft, non-tender, non-distended Extremities- no clubbing, cyanosis, or edema MS- no significant deformity or atrophy Skin- warm and dry, no rash or lesion Psych- euthymic mood, full affect Neuro- no gross deficits observed  Labs:   Lab Results  Component Value Date   WBC 7.2 10/30/2016   HGB 8.4 (L) 10/30/2016   HCT 25.0 (L) 10/30/2016   MCV 95.1 10/30/2016   PLT 84 (L) 10/30/2016    Recent Labs Lab 10/27/16 0353  10/30/16 0420  NA 135  < > 134*  K 4.7  < > 3.7  CL 101  < > 100*  CO2 22  < > 27  BUN 27*  < > 35*  CREATININE 2.39*  < > 1.85*  CALCIUM 8.5*  < > 8.2*  PROT 5.9*  --   --   BILITOT 1.0  --   --   ALKPHOS 36*  --   --   ALT 10*  --   --   AST 49*  --   --   GLUCOSE 143*  < > 99  < > = values in this interval not displayed.    Radiology/Studies:  Dg Chest Port 1 View Result Date: 10/30/2016 CLINICAL DATA:  Sore chest. Status post mitral valve replacement and tricuspid valve repair. EXAM: PORTABLE CHEST 1 VIEW COMPARISON:  Portable chest x-ray of October 29, 2016 FINDINGS: There is persistent subcutaneous emphysema on the right. No definite right-sided pneumothorax nor pneumomediastinum is observed. There is a small right pleural effusion. The interstitial markings of both lungs are increased. Increased density in the right lower lung persists. The cardiac silhouette remains enlarged. The pulmonary vascularity is more engorged than on the previous study. There is calcification in the wall of  the aortic arch. The bony thorax exhibits no acute abnormality. IMPRESSION: Persistent right-sided subcutaneous emphysema without definite pneumothorax or pneumomediastinum. Slight interval increase conspicuity of the pulmonary interstitium suggests low-grade interstitial edema. Persistent right lower lobe atelectasis. Thoracic aortic atherosclerosis. Electronically Signed   By: David  Martinique M.D.   On: 10/30/2016 07:52    EKG's and telemetry are reviewed by myself EKG:  today is CHB, RBBB, 52bpm Pre-op, 10/16/16 was SR, RBBB, PR 119ms TELEMETRY: SR, V paced, occ AV paced, CHB underlying this morning 10/30/16: TTE Study Conclusions - Left ventricle: Diffuse hypokinesis abnormal septal motion   Systolic function was mildly to moderately reduced. The estimated   ejection fraction was in the range of 40% to 45%. Diffuse   hypokinesis. The study is not technically sufficient to allow   evaluation of LV diastolic function. - Aortic valve: There was mild regurgitation. - Mitral valve: Normal appearing bioprosthetic MVR. No peri   valvular regurgitations   Assessment and Plan:   1. Post-op CHB     POD #5 today s/p minimally invasive bioprosthetic MVR and TV repair     Amiodarone gtt stopped 10/27/16 AM (per RN 2/2 episode of  NSVT )     No nodal blocking or rate limiting medicines since     Remains with temp pacing wires     May require PPM     Marsha Gundlach check in the morning, Nechama Escutia hold NPO tonight  2. VHD     POD#5 minimally invasive bioprosthetic MVR and TV repair     P/o anemia     P/u subq emphysema  3. CHF     Back to pre-op weight     Weaning milrinone, c/w CTS service  4. COPD  severe pulm. HTN known for patient  5. AKI on CKD     improving    Signed, Tommye Standard, PA-C 10/30/2016 2:00 PM  I have seen and examined this patient with Tommye Standard.  Agree with above, note added to reflect my findings.  On exam, iRRR, no murmurs, lungs clear. Currently postop day 5 after  minimally invasive tricuspid valve repair and mitral valve replacement. Went into atrial fibrillation and got 24 hours worth of amiodarone, and went into complete heart block after amiodarone was given. He had a right bundle-branch block prior to his operation.  It is possible that his 24 hours of amiodarone caused transient heart block, along with his mitral valve replacement. Fortunately, his epicardial wires are still functioning appropriately. Should he remain in complete heart block, he may benefit from pacing in the future. If he does require a pacemaker, due to his heart block and low EF, would require a BiV-P as his EF has improved post-op.  Charli Liberatore M. Juris Gosnell MD 10/30/2016 4:29 PM

## 2016-10-30 NOTE — Care Management Important Message (Signed)
Important Message  Patient Details  Name: Aaron Bennett MRN: [id number] Date of Birth: Jan 05, 1951   Medicare Important Message Given:  Yes    Nathen May 10/30/2016, 4:34 PM

## 2016-10-31 DIAGNOSIS — I483 Typical atrial flutter: Secondary | ICD-10-CM

## 2016-10-31 LAB — COOXEMETRY PANEL
Carboxyhemoglobin: 2.7 % — ABNORMAL HIGH (ref 0.5–1.5)
Methemoglobin: 0.8 % (ref 0.0–1.5)
O2 SAT: 79.5 %
Total hemoglobin: 9 g/dL — ABNORMAL LOW (ref 12.0–16.0)

## 2016-10-31 LAB — PROTIME-INR
INR: 1.34
PROTHROMBIN TIME: 16.7 s — AB (ref 11.4–15.2)

## 2016-10-31 MED ORDER — PREDNISONE 5 MG (21) PO TBPK
5.0000 mg | ORAL_TABLET | Freq: Four times a day (QID) | ORAL | Status: DC
  Administered 2016-11-02: 5 mg via ORAL

## 2016-10-31 MED ORDER — PREDNISONE 5 MG (21) PO TBPK
10.0000 mg | ORAL_TABLET | Freq: Every morning | ORAL | Status: AC
  Administered 2016-10-31: 10 mg via ORAL
  Filled 2016-10-31: qty 21

## 2016-10-31 MED ORDER — PREDNISONE 5 MG (21) PO TBPK
5.0000 mg | ORAL_TABLET | ORAL | Status: AC
  Administered 2016-10-31: 5 mg via ORAL

## 2016-10-31 MED ORDER — PREDNISONE 5 MG (21) PO TBPK
5.0000 mg | ORAL_TABLET | Freq: Three times a day (TID) | ORAL | Status: AC
  Administered 2016-11-01 (×3): 5 mg via ORAL

## 2016-10-31 MED ORDER — PREDNISONE 5 MG (21) PO TBPK
10.0000 mg | ORAL_TABLET | Freq: Every evening | ORAL | Status: AC
  Administered 2016-10-31: 10 mg via ORAL

## 2016-10-31 MED ORDER — PREDNISONE 5 MG (21) PO TBPK
10.0000 mg | ORAL_TABLET | Freq: Every evening | ORAL | Status: AC
  Administered 2016-11-01: 10 mg via ORAL

## 2016-10-31 MED ORDER — COLCHICINE 0.6 MG PO TABS
0.6000 mg | ORAL_TABLET | Freq: Two times a day (BID) | ORAL | Status: DC
  Administered 2016-10-31 – 2016-11-02 (×5): 0.6 mg via ORAL
  Filled 2016-10-31 (×5): qty 1

## 2016-10-31 MED ORDER — PREDNISONE 5 MG (21) PO TBPK: 5.0000 mg | ORAL_TABLET | ORAL | Status: DC

## 2016-10-31 NOTE — Discharge Summary (Signed)
Physician Discharge Summary       [address]       [place] [address]             [phone]    Patient ID: Aaron Bennett MRN: [id number] DOB/AGE: 1951/05/24 66 y.o.  Admit date: 10/25/2016 Discharge date: 11/04/2016  Admission Diagnoses: 1.Severe mitral regurgitation 2. Tricuspid valve insufficiency Active Diagnoses:  1. Anemia of chronic disease 2. CKD (chronic kidney disease) stage 3, GFR 30-59 ml/min 3. Chronic combined systolic and diastolic congestive heart failure ([place]) 4. History of ETOH abuse 5. Hypertension 6. Prostate cancer ([place]) 7. Tobacco abuse 8. CHB followed by AV WB 9. Paroxsymal atrial flutter 10. Gout right hand  Consults: EPS  Procedure (s):   Minimally-Invasive Mitral Valve Replacement             Preservation of chordae tendinae to both the anterior and posterior leaflets             Edwards Magna Mitral Bovine Bioprosthetic Tissue Valve (size 43mm, model # 7300TFX, serial # [id number])   Minimally-Invasive Tricuspid Valve Repair             Edwards mc3 Ring Annuloplasty (size 59mm, model # [id number], serial # [id number]) by Dr. Roxy Manns on 10/25/2016.  History of Presenting Illness: Patient is a 66 year old African-American male with history of mitral regurgitation, tricuspid regurgitation, chronic combined systolic and diastolic congestive heart failure, RBBB,hypertension, stage III chronic kidney disease, hyperlipidemia, previous heavy alcohol abuse and tobacco abuse, and prostate cancer who has been referred for surgical consultation to discuss treatment options for management of severe symptomatic mitral regurgitation. The patient reportedly has a long history of heavy alcohol and tobacco abuse. He began to feel poorly more than a year ago and made efforts to cut back and eventually quit drinking and smoking completely. He successfully stopped drinking and smoking last July. At that time he presented with symptoms of class IV congestive  heart failure and a brief syncopal episode that was attributed to possible alcohol withdrawal seizure. He was hospitalized at [facility] and started on medical therapy for congestive heart failure. Transthoracic echocardiogram performed at that time revealed severe mitral regurgitation, moderate tricuspid regurgitation and left ventricular ejection fraction estimated 50-55%. Symptoms improved and the patient has continued to abstain from any tobacco or alcohol use. He has been seen in follow-up on several occasions at [facility] has continued to complain of symptoms of exertional shortness of breath despite remaining compliant with optimal medical therapy for congestive heart failure. He was seen in follow-up recently and scheduled for transesophageal echocardiogram and diagnostic cardiac catheterization. TEE performed 09/06/2016 confirmed the presence of severe mitral regurgitation, mild aortic insufficiency, and mild to moderate tricuspid regurgitation. Left ventricular ejection fraction was reported 50-55%. Left and right heart catheterization performed 09/06/2016 was notable for the absence of significant coronary artery disease. The patient had moderate to severe pulmonary hypertension with large V waves on wedge tracing consistent with severe mitral regurgitation. The patient was referred for surgical consultation.  The patient has been retired for approximately 6 years, having previously worked as a Horticulturist, commercial. He admits to long-standing heavy alcohol and tobacco abuse although he has been completely abstinent since July 2017. He admits that he does use E cigarettes but he has not smoked any cigarettes since last summer. He has not had any alcohol. At the time he presented last July he complained of symptoms consistent with acute on chronic combined systolic and diastolic  congestive heart failure, functional class IV. At present the patient describes stable symptoms of  exertional shortness of breath that occur with low-level activity. He denies resting shortness of breath, PND, orthopnea, palpitations, dizzy spells, or syncope. He does have some mild lower extremity edema. He denies any exertional chest pain or chest tightness. He previously lived in [place], but he moved to [place] where he now lives with his sister where he has much improved family and social support. He reports that his physical activity is limited only by exertional shortness of breath.  Patient returns to the office today for follow-up of stage D severe symptomatic mitral regurgitation. He was originally seen in consultation on 09/19/2016. He underwent routine blood work, CT angiography and pulmonary function testing earlier today and he returns to our office today for follow-up. He reports no significant changes over the last few weeks. He reports stable symptoms of exertional shortness of breath with low-level activity consistent with chronic combined systolic and diastolic congestive heart failure, New York Heart Association functional class III. Symptoms have not gotten any worse over the past few weeks. He does not have any chest pain or chest tightness. He denies any fevers, chills, or productive cough.  Patient has stage D severe symptomatic mitral regurgitation. 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 Although symptoms of congestive heart failure have improved, he continues to experience exertional shortness of breath with low level activity consistent with chronic combined systolic and diastolic congestive heart failure, New York Heart Association functional class III. I have  personally reviewed the patient's transthoracic and transesophageal echocardiograms,diagnostic cardiac catheterization, CT angiogram and pulmonary function tests. Left ventricular function is probably at least mildly globally reduced. Ejection fraction has been estimated 50-55% in the setting of severe mitral regurgitation. The left ventricle was mildly dilated. There are no obvious wall motion abnormalities. Functional anatomy of the patient's mitral regurgitation remains a bit unclear. There were very limited images obtained at the time of the patient's recent TEE. What images are available reveal fairly normal leaflet mobility with a broad central jet of mitral regurgitation. 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 The subvalvular apparatus was not image thoroughly on TEE but similar images suggest the possibility of an abnormality of the papillary muscles, possibly even congenital. Diagnostic cardiac catheterization is notable for the absence of significant coronary artery disease but the presence of moderate to severe pulmonary hypertension. The patient clearly needs surgery. Potential risks, benefits, and possible complications were discussed with the patient and he agreed to proceed with surgery. Pre operative carotid duplex US showed no significant internal carotid artery stenosis bilaterally. He was admitted to [facility] on 10/25/2016 in order to undergo a minimally invasive mitral valve replacement and tricuspid valve repair.  Brief Hospital Course:  The patient was extubated the morning of post operative day one without difficulty. He remained afebrile and hemodynamically stable. He was weaned off of  Epinephrine and Dopamine drips. He was AV paced initially. He later had CHB and EPS was consulted. His conduction system slowly improved. He had AV WB  and EPS felt a  PPM was not warranted at this time. Gordy Councilman, a line, and foley were removed early in the post operative course. Chest tubes remained for awhile and then were removed. He was volume over loaded and diuresed via a Lasix drip. He was later transitioned to oral Lasix. He had ABL anemia. He required a post op transfusion. Last H and H was 8.9. He was thought to have pneumonia and was placed on Fortaz. He was started on Coumadin. His PT and INR were monitored daily. He is currently on 5 mg of Coumadin. His latest INR is 1.82. He will need to call [facility] and have a PT and INR drawn 48 hours after discharge from [facility].  He was weaned off the insulin drip. The patient's HGA1C pre op was  5.5. The patient was felt surgically stable for transfer from the ICU to PCTU for further convalescence on 10/30/2016. He remained on a Milrinone drip and his co ox was monitored daily. His co ox was up to 50.5 03/08 and his Milrinone was stopped. He went back into a flutter on 03/08. EPS then decided to place a pacemaker. This was done on 03/09.   He continues to progress with cardiac rehab. He was ambulating on room air. He  has been tolerating a diet and has had a bowel movement. He did develop gout in his right hand. He was started on Allopurinol,Colchicine, and steroids. Colchicine was decreased and steroids were stopped on 03/08.  His gout improved. Chest tube sutures will be removed the day of discharge. The patient is felt surgically stable for discharge today.    Latest Vital Signs: Blood pressure (!) 121/54, pulse 69, temperature 99 F (37.2 C), temperature source Oral, resp. rate 16, height 5\' 11"  (1.803 m), weight 150 lb 4.8 oz (68.2 kg), SpO2 100 %.  Physical Exam: Cardiovascular: RRR, no murmur Pulmonary: Slightly diminshed throughout Abdomen: Soft, non tender, bowel sounds present. Extremities: No lower extremity edema. Wounds: Clean and dry.  No erythema or signs of  infection  Discharge Condition:Stable and discharged to home.  Recent laboratory studies:  Lab Results  Component Value Date   WBC 8.8 11/04/2016   HGB 8.9 (L) 11/04/2016   HCT 27.8 (L) 11/04/2016   MCV 97.5 11/04/2016   PLT 285 11/04/2016   Lab Results  Component Value Date   NA 135 11/04/2016   K 4.0 11/04/2016   CL 100 (L) 11/04/2016   CO2 26 11/04/2016   CREATININE 1.44 (H) 11/04/2016   GLUCOSE 105 (H) 11/04/2016    Diagnostic Studies:  Dg Chest Port 1 View  Result Date: 10/30/2016 CLINICAL DATA:  Sore chest. Status post mitral valve replacement and tricuspid valve repair. EXAM: PORTABLE CHEST 1 VIEW COMPARISON:  Portable chest x-ray of October 29, 2016 FINDINGS: There is persistent subcutaneous emphysema on the right. No definite right-sided pneumothorax nor pneumomediastinum is observed. There is a small right pleural effusion. The interstitial markings of both lungs are increased. Increased density in the right lower lung persists. The cardiac silhouette remains enlarged. The pulmonary vascularity is more engorged than on the previous study. There is calcification in the wall of the aortic arch. The bony thorax exhibits no acute abnormality. IMPRESSION: Persistent right-sided subcutaneous emphysema without definite pneumothorax or pneumomediastinum. Slight interval increase conspicuity of the pulmonary interstitium suggests low-grade interstitial edema. Persistent right lower lobe atelectasis. Thoracic aortic atherosclerosis. Electronically Signed   By: David  Martinique M.D.   On: 10/30/2016 07:52   Ct Angio  Chest Aorta W &/or Wo Contrast  Result Date: 10/16/2016 CLINICAL DATA:  66 year old male with history of mitral and tricuspid valve regurgitation. Preoperative study. EXAM: CT ANGIOGRAPHY CHEST, ABDOMEN AND PELVIS TECHNIQUE: Multidetector CT imaging through the chest, abdomen and pelvis was performed using the standard protocol during bolus administration of intravenous contrast.  Multiplanar reconstructed images and MIPs were obtained and reviewed to evaluate the vascular anatomy. CONTRAST:  75 mL of Isovue 370. COMPARISON:  No priors. FINDINGS: CTA CHEST FINDINGS Cardiovascular: Heart size is moderately enlarged with right ventricular, right atrial and left atrial dilatation. Small amount of pericardial fluid and/or thickening, unlikely to be of any hemodynamic significance at this time. No associated pericardial calcification. Atherosclerosis of the thoracic aorta (mild), without evidence of aneurysm or dissection. However, there is ectasia of ascending thoracic aorta (4.2 cm in diameter). No atherosclerotic calcifications are identified in the coronary arteries. Dilatation of the pulmonic trunk (3.9 cm in diameter). Mediastinum/Nodes: Several borderline enlarged and mildly enlarged mediastinal lymph nodes are noted, largest of which measures 12 mm in short axis in the low left paratracheal nodal station. Esophagus is unremarkable in appearance. No axillary lymphadenopathy. Lungs/Pleura: Trace bilateral pleural effusions (right greater than left). Widespread interlobular septal thickening, suggestive of a background of mild interstitial pulmonary edema. No confluent consolidative airspace disease. No pleural effusions. Mild diffuse bronchial wall thickening with mild centrilobular and paraseptal emphysema. No definite suspicious appearing pulmonary nodules or masses. Musculoskeletal: There are no aggressive appearing lytic or blastic lesions noted in the visualized portions of the skeleton. Review of the MIP images confirms the above findings. CTA ABDOMEN AND PELVIS FINDINGS VASCULAR Aorta: Normal caliber aorta without aneurysm, dissection, vasculitis or significant stenosis. Celiac: Patent without evidence of aneurysm, dissection, vasculitis or significant stenosis. SMA: Patent without evidence of aneurysm, dissection, vasculitis or significant stenosis. Renals: Both renal arteries are  patent without evidence of aneurysm, dissection, vasculitis, fibromuscular dysplasia or significant stenosis. IMA: Patent without evidence of aneurysm, dissection, vasculitis or significant stenosis. Inflow: Patent without evidence of aneurysm, dissection, vasculitis or significant stenosis. Veins: No obvious venous abnormality within the limitations of this arterial phase study. Review of the MIP images confirms the above findings. NON-VASCULAR Hepatobiliary: No suspicious cystic or solid hepatic lesions. No intra or extrahepatic biliary ductal dilatation. Several small calcified gallstones lie dependently in the gallbladder, measuring up to 7 mm. No finding to suggest an acute cholecystitis at this time. Pancreas: No pancreatic mass. No pancreatic ductal dilatation. No pancreatic or peripancreatic fluid or inflammatory changes. Spleen: Unremarkable. Adrenals/Urinary Tract: 11 mm intermediate attenuation (26 HU) lesion in the interpolar region of the left kidney likely represents a small mildly proteinaceous cyst. 2 cm low-attenuation lesion in the interpolar region of the right kidney is compatible with a simple cyst. Bilateral adrenal glands are normal in appearance. No hydroureteronephrosis. The left side of the urinary bladder extends into the neck of a small left inguinal hernia, but the urinary bladder is otherwise unremarkable in appearance. Stomach/Bowel: Normal appearance of the stomach. No pathologic dilatation of small bowel or colon. The appendix is not confidently identified and may be surgically absent. Regardless, there are no inflammatory changes noted adjacent to the cecum to suggest the presence of an acute appendicitis at this time. Lymphatic: No lymphadenopathy noted in the abdomen or pelvis. Multiple prominent borderline enlarged retroperitoneal lymph nodes are nonspecific. Reproductive: Brachytherapy implants throughout the prostate gland. Seminal vesicles are unremarkable in appearance.  Other: No significant volume of ascites.  No pneumoperitoneum. Musculoskeletal: There  are no aggressive appearing lytic or blastic lesions noted in the visualized portions of the skeleton. Review of the MIP images confirms the above findings. IMPRESSION: 1. Cardiomegaly with dilatation of the right ventricle, right atrium and left atrium. Evidence of mild interstitial pulmonary edema in the lungs and trace bilateral pleural effusions. Findings suggest mild congestive heart failure. 2. Several borderline enlarged and mildly enlarged mediastinal lymph nodes. In the setting of apparent congestive heart failure, this is considered highly nonspecific. No lymphadenopathy is noted elsewhere in the chest, abdomen or pelvis (although there are borderline enlarged retroperitoneal lymph nodes, which are also nonspecific). Unless there are clinical findings to suggest lymphoproliferative disorder, this is considered benign at this time. 3. 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 Recommend annual imaging followup by CTA or MRA. This recommendation follows 2010 ACCF/AHA/AATS/ACR/ASA/SCA/SCAI/SIR/STS/SVM Guidelines for the Diagnosis and Management of Patients with Thoracic Aortic Disease. Circulation. 2010; 121: [id number]. 4. Although there is atherosclerosis throughout the abdominal and pelvic vasculature, this is nonocclusive. 5. Cholelithiasis without evidence of acute cholecystitis at this time. 6. Dilatation of the pulmonic trunk (3.9 cm in diameter), which may suggest underlying pulmonary arterial hypertension. 7. Additional incidental findings, as above. Electronically Signed   By: Vinnie Langton M.D.   On: 10/16/2016 15:21   Ct Angio Abd/pel W/ And/or W/o  Result Date: 10/16/2016 CLINICAL DATA:  66 year old male with history of mitral and tricuspid valve regurgitation. Preoperative study. EXAM: CT ANGIOGRAPHY CHEST, ABDOMEN AND PELVIS TECHNIQUE: Multidetector CT imaging through the chest, abdomen  and pelvis was performed using the standard protocol during bolus administration of intravenous contrast. Multiplanar reconstructed images and MIPs were obtained and reviewed to evaluate the vascular anatomy. CONTRAST:  75 mL of Isovue 370. COMPARISON:  No priors. FINDINGS: CTA CHEST FINDINGS Cardiovascular: Heart size is moderately enlarged with right ventricular, right atrial and left atrial dilatation. Small amount of pericardial fluid and/or thickening, unlikely to be of any hemodynamic significance at this time. No associated pericardial calcification. Atherosclerosis of the thoracic aorta (mild), without evidence of aneurysm or dissection. However, there is ectasia of ascending thoracic aorta (4.2 cm in diameter). No atherosclerotic calcifications are identified in the coronary arteries. Dilatation of the pulmonic trunk (3.9 cm in diameter). Mediastinum/Nodes: Several borderline enlarged and mildly enlarged mediastinal lymph nodes are noted, largest of which measures 12 mm in short axis in the low left paratracheal nodal station. Esophagus is unremarkable in appearance. No axillary lymphadenopathy. Lungs/Pleura: Trace bilateral pleural effusions (right greater than left). Widespread interlobular septal thickening, suggestive of a background of mild interstitial pulmonary edema. No confluent consolidative airspace disease. No pleural effusions. Mild diffuse bronchial wall thickening with mild centrilobular and paraseptal emphysema. No definite suspicious appearing pulmonary nodules or masses. Musculoskeletal: There are no aggressive appearing lytic or blastic lesions noted in the visualized portions of the skeleton. Review of the MIP images confirms the above findings. CTA ABDOMEN AND PELVIS FINDINGS VASCULAR Aorta: Normal caliber aorta without aneurysm, dissection, vasculitis or significant stenosis. Celiac: Patent without evidence of aneurysm, dissection, vasculitis or significant stenosis. SMA: Patent  without evidence of aneurysm, dissection, vasculitis or significant stenosis. Renals: Both renal arteries are patent without evidence of aneurysm, dissection, vasculitis, fibromuscular dysplasia or significant stenosis. IMA: Patent without evidence of aneurysm, dissection, vasculitis or significant stenosis. Inflow: Patent without evidence of aneurysm, dissection, vasculitis or significant stenosis. Veins: No obvious venous abnormality within the limitations of this arterial phase study. Review of the MIP images confirms the above findings. NON-VASCULAR Hepatobiliary:  No suspicious cystic or solid hepatic lesions. No intra or extrahepatic biliary ductal dilatation. Several small calcified gallstones lie dependently in the gallbladder, measuring up to 7 mm. No finding to suggest an acute cholecystitis at this time. Pancreas: No pancreatic mass. No pancreatic ductal dilatation. No pancreatic or peripancreatic fluid or inflammatory changes. Spleen: Unremarkable. Adrenals/Urinary Tract: 11 mm intermediate attenuation (26 HU) lesion in the interpolar region of the left kidney likely represents a small mildly proteinaceous cyst. 2 cm low-attenuation lesion in the interpolar region of the right kidney is compatible with a simple cyst. Bilateral adrenal glands are normal in appearance. No hydroureteronephrosis. The left side of the urinary bladder extends into the neck of a small left inguinal hernia, but the urinary bladder is otherwise unremarkable in appearance. Stomach/Bowel: Normal appearance of the stomach. No pathologic dilatation of small bowel or colon. The appendix is not confidently identified and may be surgically absent. Regardless, there are no inflammatory changes noted adjacent to the cecum to suggest the presence of an acute appendicitis at this time. Lymphatic: No lymphadenopathy noted in the abdomen or pelvis. Multiple prominent borderline enlarged retroperitoneal lymph nodes are nonspecific.  Reproductive: Brachytherapy implants throughout the prostate gland. Seminal vesicles are unremarkable in appearance. Other: No significant volume of ascites.  No pneumoperitoneum. Musculoskeletal: There are no aggressive appearing lytic or blastic lesions noted in the visualized portions of the skeleton. Review of the MIP images confirms the above findings. IMPRESSION: 1. Cardiomegaly with dilatation of the right ventricle, right atrium and left atrium. Evidence of mild interstitial pulmonary edema in the lungs and trace bilateral pleural effusions. Findings suggest mild congestive heart failure. 2. Several borderline enlarged and mildly enlarged mediastinal lymph nodes. In the setting of apparent congestive heart failure, this is considered highly nonspecific. No lymphadenopathy is noted elsewhere in the chest, abdomen or pelvis (although there are borderline enlarged retroperitoneal lymph nodes, which are also nonspecific). Unless there are clinical findings to suggest lymphoproliferative disorder, this is considered benign at this time. 3. 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 Recommend annual imaging followup by CTA or MRA. This recommendation follows 2010 ACCF/AHA/AATS/ACR/ASA/SCA/SCAI/SIR/STS/SVM Guidelines for the Diagnosis and Management of Patients with Thoracic Aortic Disease. Circulation. 2010; 121: [id number]. 4. Although there is atherosclerosis throughout the abdominal and pelvic vasculature, this is nonocclusive. 5. Cholelithiasis without evidence of acute cholecystitis at this time. 6. Dilatation of the pulmonic trunk (3.9 cm in diameter), which may suggest underlying pulmonary arterial hypertension. 7. Additional incidental findings, as above. Electronically Signed   By: Vinnie Langton M.D.   On: 10/16/2016 15:21    Discharge Medications: Allergies as of 11/04/2016      Reactions   No Known Allergies       Medication List    STOP taking these medications   hydrALAZINE  10 MG tablet Commonly known as:  APRESOLINE   isosorbide mononitrate 30 MG 24 hr tablet Commonly known as:  IMDUR     TAKE these medications   acetaminophen 325 MG tablet Commonly known as:  TYLENOL Take 1-2 tablets (325-650 mg total) by mouth every 4 (four) hours as needed for mild pain.   amiodarone 200 MG tablet Commonly known as:  PACERONE Take 1 tablet (200 mg total) by mouth 2 (two) times daily after a meal.   aspirin EC 81 MG tablet Take 1 tablet (81 mg total) by mouth daily.   carvedilol 6.25 MG tablet Commonly known as:  COREG Take 1 tablet (6.25 mg total) by  mouth 2 (two) times daily with a meal.   folic acid 1 MG tablet Commonly known as:  FOLVITE Take 1 tablet (1 mg total) by mouth daily.   furosemide 40 MG tablet Commonly known as:  LASIX Take 1 tablet (40 mg total) by mouth daily.   multivitamin tablet Take 1 tablet by mouth daily.   oxyCODONE 5 MG immediate release tablet Commonly known as:  Oxy IR/ROXICODONE Take 1-2 tablets (5-10 mg total) by mouth every 4 (four) hours as needed for severe pain.   potassium chloride SA 20 MEQ tablet Commonly known as:  K-DUR,KLOR-CON Take 1 tablet (20 mEq total) by mouth daily.   thiamine 100 MG tablet Commonly known as:  VITAMIN B-1 Take 1 tablet (100 mg total) by mouth daily.   warfarin 5 MG tablet Commonly known as:  COUMADIN Take 1 tablet (5 mg total) by mouth daily at 6 PM.      The patient has been discharged on:   1.Beta Blocker:  Yes [  x ]                              No   [   ]                              If No, reason:  2.Ace Inhibitor/ARB: Yes [   ]                                     No  [  x  ]                                     If No, reason:Labile BP, elevated creatinine  3.Statin:   Yes [   ]                  No  [  x ]                  If No, reason:No CAD  4.[place]:  Yes  [ x  ]                  No   [   ]                  If No, reason:  Follow Up Appointments: Follow-up  Information    [name], MD Follow up.   Specialty:  Cardiothoracic Surgery Why:  PA/LAT CXR to be taken (at [facility] which is in the same building as Dr. Guy Sandifer office) on at 11/13/2016 at 1:45 pm;Appointment time is at 2:15 pm Contact information: [place] [address] [phone]        Rosaria Ferries, PA-C Follow up on 11/14/2016.   Specialties:  Cardiology, Radiology Why:  Appointment is at 8:00 am Contact information: [address] [place] [address] [phone]        [facility] Follow up on 11/06/2016.   Specialty:  Cardiology Why:  Please contact office to set up PT/INR draw for Monday 11/06/2016 Contact information: [address], [place] [phone]  Cristopher Peru, MD Follow up on 11/14/2016.   Specialty:  Cardiology Why:  10:00AM Pacemaker wound check  Contact information: [address] [place] [address] [phone]           Signed: Cinda Quest 11/04/2016, 8:05 AM

## 2016-10-31 NOTE — Progress Notes (Signed)
      [address]       [place] [address]             [phone]     Asked by Dr Rayann Heman to attempt to rapid a pace for flutter. Not successful. Placed back on VVI at 61.  GOLD,WAYNE E, PA-C

## 2016-10-31 NOTE — Progress Notes (Signed)
SUBJECTIVE: The patient is doing well today.  At this time, he denies chest pain, shortness of breath, only c/o R hand pain  . aspirin EC  81 mg Oral Daily  . bisacodyl  10 mg Oral Daily   Or  . bisacodyl  10 mg Rectal Daily  . Chlorhexidine Gluconate Cloth  6 each Topical Daily  . colchicine  0.6 mg Oral BID  . docusate sodium  200 mg Oral Daily  . enoxaparin (LOVENOX) injection  30 mg Subcutaneous Q24H  . folic acid  1 mg Oral Daily  . furosemide  40 mg Intravenous Daily  . guaiFENesin  600 mg Oral BID  . ipratropium-albuterol  3 mL Nebulization TID  . iron polysaccharides  150 mg Oral Daily  . pantoprazole  40 mg Oral Daily  . patient's guide to using coumadin book   Does not apply Once  . predniSONE  10 mg Oral AC breakfast  . predniSONE  10 mg Oral Nightly  . [START ON 11/01/2016] predniSONE  10 mg Oral Nightly  . predniSONE  5 mg Oral PC lunch  . predniSONE  5 mg Oral PC supper  . [START ON 11/01/2016] predniSONE  5 mg Oral 3 x daily with food  . [START ON 11/02/2016] predniSONE  5 mg Oral 4X daily taper  . sodium chloride flush  10-40 mL Intracatheter Q12H  . thiamine  100 mg Oral Daily  . warfarin  5 mg Oral q1800  . warfarin   Does not apply Once  . Warfarin - Physician Dosing Inpatient   Does not apply q1800   . sodium chloride 250 mL (10/28/16 1600)  . milrinone 0.125 mcg/kg/min (10/31/16 1046)    OBJECTIVE: Physical Exam: Vitals:   10/30/16 2053 10/30/16 2104 10/31/16 0512 10/31/16 0913  BP:  112/73 116/62   Pulse:  95 87 69  Resp:  20 18 18   Temp:  99.9 F (37.7 C) 98.9 F (37.2 C)   TempSrc:  Oral Oral   SpO2: 93% 99% 94% 90%  Weight:   152 lb 4.8 oz (69.1 kg)   Height:        Intake/Output Summary (Last 24 hours) at 10/31/16 1134 Last data filed at 10/31/16 1119  Gross per 24 hour  Intake            306.8 ml  Output             1400 ml  Net          -1093.2 ml    Telemetry is reviewed by myself and Dr. Rayann Heman is Aflutter, V paced (via temp  wires)  GEN- The patient is well appearing, alert and oriented x 3 today.   Head- normocephalic, atraumatic Eyes-  Sclera clear, conjunctiva pink Ears- hearing intact Oropharynx- clear Neck- supple, no JVP Lungs- mildly diminished at the bases, normal work of breathing Heart-RRR, no significant murmurs, no rubs or gallops GI- soft, NT, ND Extremities- no clubbing, cyanosis, or edema, R hand swelling Skin- no rash or lesion Psych- euthymic mood, full affect Neuro- no gross deficits appreciated  LABS: Basic Metabolic Panel:  Recent Labs  10/29/16 0345 10/30/16 0420  NA 132* 134*  K 3.6 3.7  CL 96* 100*  CO2 27 27  GLUCOSE 130* 99  BUN 43* 35*  CREATININE 2.37* 1.85*  CALCIUM 8.2* 8.2*    CBC:  Recent Labs  10/29/16 0345 10/30/16 0420  WBC 6.4 7.2  HGB 8.8* 8.4*  HCT 26.8*  25.0*  MCV 93.4 95.1  PLT 74* 84*      ASSESSMENT AND PLAN:   1. Post-op CHB     POD #6 today s/p minimally invasive bioprosthetic MVR and TV repair     Amiodarone gtt stopped 10/27/16 AM (per RN 2/2 episode of  NSVT )     No nodal blocking or rate limiting medicines since     Remains with temp pacing wires     this morning is in new AFlutter, 40's-60's under temp pacing     The patient would really prefer to try and avoid permanent pacer Dr. Rayann Heman discussed trying to pace terminate his Aflutter and see where his sinus rate/rhythm is or if he remains in CHB  2. VHD     POD#6 minimally invasive bioprosthetic MVR and TV repair     P/o anemia     P/u subq emphysema  3. CHF     weight remains on a downward trend     Weaning milrinone, c/w CTS service  4. COPD     severe pulm. HTN known for patient  5. AKI on CKD     improving  6. New PAFlutter     CHA2DS2Vasc is at least 57 (age, HTN, CHF/CM)     Warfarin started post-op  Tommye Standard, PA-C 10/31/2016 11:34 AM  I have seen, examined the patient, and reviewed the above assessment and plan.  On exam, RRR (paced).  Changes to  above are made where necessary.  Currently V paced with atrial flutter underlying.  Withholding pacing reveals underlying rhythm is atrial flutter with V rates 60s.  Pt is clear that he would like to avoid pacing if possible.  Will reassess in am.  If AV block persists, would anticipate CRT-P device.  As EF is now 45%, he does not meet criteria for ICD.  Co Sign: Thompson Grayer, MD 10/31/2016 1:17 PM

## 2016-10-31 NOTE — Progress Notes (Signed)
CARDIAC REHAB PHASE I   PRE:  Rate/Rhythm: 70 paced  BP:  Sitting: 120/65        SaO2: 95 RA  MODE:  Ambulation: 150 ft   POST:  Rate/Rhythm: 70 paced  BP:  Sitting: 133/70         SaO2: 92 RA  Pt in bed, stood with min assist, however, pt unable to use a RW due to R hand pain r/t gout. Pt ambulated 150 ft on RA, IV, pacer, gait belt, handheld assist x2 (for equipment, safety), slow, steady gait, tolerated well with no complaints other than R hand pain and some fatigue with distance. Pt to recliner after walk, call bell within reach. Will follow.  [id number] Lenna Sciara, RN, BSN 10/31/2016 11:23 AM

## 2016-10-31 NOTE — Progress Notes (Signed)
Atrial flutter successfully pace terminated by me this evening.  Rhythm is now sinus rhythm with complete heart block and RBB escape 60s bpm.   I have programmed temporary pacing VVI 50 bpm.  Will observe overnight and make decision about pacing in AM.  As he has a stable V rate, a conservative approach could be considered.  I agree with Dr Roxy Manns however that given RBBB chronically that return of conduction may not occur.  Dr Lovena Le to reassess in am.  Please feel free to call me with rhythm issues overnight.  Thompson Grayer MD, [facility] 10/31/2016 6:07 PM

## 2016-10-31 NOTE — Progress Notes (Addendum)
      [address]       ,[place] [address]             [phone]        6 Days Post-Op Procedure(s) (LRB): MINIMALLY INVASIVE TRICUSPID VALVE REPAIR (Right) TRANSESOPHAGEAL ECHOCARDIOGRAM (TEE) (N/A) MINIMALLY INVASIVE MITRAL VALVE (MV) REPLACEMENT (Right)  Subjective: His only complaint is painful right hand-gout.  Objective: Vital signs in last 24 hours: Temp:  [97.7 F (36.5 C)-99.9 F (37.7 C)] 98.9 F (37.2 C) (03/06 0512) Pulse Rate:  [44-95] 87 (03/06 0512) Cardiac Rhythm: A-V Sequential paced (03/05 1900) Resp:  [18-29] 18 (03/06 0512) BP: (100-131)/(62-91) 116/62 (03/06 0512) SpO2:  [80 %-99 %] 94 % (03/06 0512) Weight:  [152 lb 4.8 oz (69.1 kg)] 152 lb 4.8 oz (69.1 kg) (03/06 0512)  Pre op weight 70 kg Current Weight  10/31/16 152 lb 4.8 oz (69.1 kg)       Intake/Output from previous day: 03/05 0701 - 03/06 0700 In: 856.3 [P.O.:720; I.V.:136.3] Out: 1500 [Urine:1500]   Physical Exam:  Cardiovascular: RRR, no murmur Pulmonary: Slightly diminshed throughout Abdomen: Soft, non tender, bowel sounds present. Extremities: No lower extremity edema. Wounds: Clean and dry.  No erythema or signs of infection.  Lab Results: CBC: Recent Labs  10/29/16 0345 10/30/16 0420  WBC 6.4 7.2  HGB 8.8* 8.4*  HCT 26.8* 25.0*  PLT 74* 84*   BMET:  Recent Labs  10/29/16 0345 10/30/16 0420  NA 132* 134*  K 3.6 3.7  CL 96* 100*  CO2 27 27  GLUCOSE 130* 99  BUN 43* 35*  CREATININE 2.37* 1.85*  CALCIUM 8.2* 8.2*    PT/INR:  Lab Results  Component Value Date   INR 1.34 10/31/2016   INR 1.25 10/30/2016   INR 1.15 10/29/2016   ABG:  INR: Will add last result for INR, ABG once components are confirmed Will add last 4 CBG results once components are confirmed  Assessment/Plan:  1. CV - Post op CHB, AV paced. EPS evaluating for PPM this am. Echo done yesterday showed LVEF 40-45%, mild AI, mild TR, no peri valvular regurg.On  Coumadin . INR slightly increased from 1.25 to 1.34. Will continue with mg daily. 2.  Pulmonary - History of COPD. On room air. Will order PA/LAT CXR for am. Encourage incentive spirometer. 3. AKI on CKD.Creatinine yesterday down to 1.85 4.  Acute blood loss anemia - Last H and H 8.4 and 25. Continue Niferex and folic acid. 5. Thrombocytopenia-last platelets up to 84,000 6. On Lasix 40 mg IV for volume overload. 7. Will order labs for am 8. On Allopurinol for gout. Will discuss with Dr. Roxy Manns if colchicine might help acute symptoms. Unable to give NSAIDS secondary to elevated creatinine  ZIMMERMAN,DONIELLE MPA-C 10/31/2016,7:41 AM   I have seen and examined the patient and agree with the assessment and plan as outlined.  Currently in Aflutter underneath pacer - switched to VVI pacing.  Patient already receiving colchicine - will increase to BID and start Prednisone dose pack.  Mixed venous co-ox up.  Wean milrinone.  [name], MD 10/31/2016 9:24 AM

## 2016-10-31 NOTE — Anesthesia Postprocedure Evaluation (Addendum)
Anesthesia Post Note  Patient: Darle Newberg  Procedure(s) Performed: Procedure(s) (LRB): MINIMALLY INVASIVE TRICUSPID VALVE REPAIR (Right) TRANSESOPHAGEAL ECHOCARDIOGRAM (TEE) (N/A) MINIMALLY INVASIVE MITRAL VALVE (MV) REPLACEMENT (Right)  Patient location during evaluation: Nursing Unit Anesthesia Type: General Level of consciousness: awake and alert, patient cooperative and oriented Pain management: pain level controlled Vital Signs Assessment: post-procedure vital signs reviewed and stable Respiratory status: spontaneous breathing, nonlabored ventilation and respiratory function stable Cardiovascular status: blood pressure returned to baseline and stable Postop Assessment: no signs of nausea or vomiting Anesthetic complications: no Comments: May require pacemaker for persistent complete heart block       Last Vitals:  Vitals:   10/30/16 2104 10/31/16 0512  BP: 112/73 116/62  Pulse: 95 87  Resp: 20 18  Temp: 37.7 C 37.2 C    Last Pain:  Vitals:   10/31/16 0512  TempSrc: Oral  PainSc:                  Harveer Sadler,E. Lourdes Manning

## 2016-11-01 ENCOUNTER — Inpatient Hospital Stay (HOSPITAL_COMMUNITY): Payer: Medicare HMO

## 2016-11-01 LAB — CBC
HCT: 25.4 % — ABNORMAL LOW (ref 39.0–52.0)
Hemoglobin: 8.5 g/dL — ABNORMAL LOW (ref 13.0–17.0)
MCH: 31.7 pg (ref 26.0–34.0)
MCHC: 33.5 g/dL (ref 30.0–36.0)
MCV: 94.8 fL (ref 78.0–100.0)
PLATELETS: 142 10*3/uL — AB (ref 150–400)
RBC: 2.68 MIL/uL — ABNORMAL LOW (ref 4.22–5.81)
RDW: 15.3 % (ref 11.5–15.5)
WBC: 6.6 10*3/uL (ref 4.0–10.5)

## 2016-11-01 LAB — BASIC METABOLIC PANEL
ANION GAP: 9 (ref 5–15)
BUN: 28 mg/dL — ABNORMAL HIGH (ref 6–20)
CALCIUM: 8.4 mg/dL — AB (ref 8.9–10.3)
CO2: 27 mmol/L (ref 22–32)
Chloride: 98 mmol/L — ABNORMAL LOW (ref 101–111)
Creatinine, Ser: 1.6 mg/dL — ABNORMAL HIGH (ref 0.61–1.24)
GFR, EST AFRICAN AMERICAN: 51 mL/min — AB (ref >60)
GFR, EST NON AFRICAN AMERICAN: 44 mL/min — AB (ref >60)
GLUCOSE: 134 mg/dL — AB (ref 65–99)
Potassium: 4.3 mmol/L (ref 3.5–5.1)
SODIUM: 134 mmol/L — AB (ref 135–145)

## 2016-11-01 LAB — PROTIME-INR
INR: 1.53
Prothrombin Time: 18.5 seconds — ABNORMAL HIGH (ref 11.4–15.2)

## 2016-11-01 LAB — COOXEMETRY PANEL
CARBOXYHEMOGLOBIN: 2 % — AB (ref 0.5–1.5)
METHEMOGLOBIN: 0.9 % (ref 0.0–1.5)
O2 SAT: 46.8 %
TOTAL HEMOGLOBIN: 8.6 g/dL — AB (ref 12.0–16.0)

## 2016-11-01 MED ORDER — COUMADIN BOOK
Freq: Once | Status: AC
  Administered 2016-11-01: 16:00:00
  Filled 2016-11-01: qty 1

## 2016-11-01 NOTE — Progress Notes (Signed)
External pacer changed to VVI 40 per verbal order from [facility]. She will come ambulate with patient. CCMD updated. Pt denies needs, HR 55-65. Call bell within reach, will continue to monitor.   Fritz Pickerel, RN

## 2016-11-01 NOTE — Progress Notes (Signed)
CARDIAC REHAB PHASE I   PRE:  Rate/Rhythm: 97 pacing    BP: sitting 124/75    SaO2: 99 RA  MODE:  Ambulation: 350 ft   POST:  Rate/Rhythm: 107 pacing    BP: sitting 125/90     SaO2: 90 RA  Pt feeling better this morning. Able to get up with min assist and walk with RW (hand pain decreased). Mostly steady, initially assist x2 but doing well so then assist x1. Pt increased distance, no major c/o. Return to bed (had been in chair all morning).   [id number] [place], ACSM 11/01/2016 10:49 AM

## 2016-11-01 NOTE — Progress Notes (Addendum)
      [address]       [place] [address]             [phone]        7 Days Post-Op Procedure(s) (LRB): MINIMALLY INVASIVE TRICUSPID VALVE REPAIR (Right) TRANSESOPHAGEAL ECHOCARDIOGRAM (TEE) (N/A) MINIMALLY INVASIVE MITRAL VALVE (MV) REPLACEMENT (Right)  Subjective: He states his pain is less in right hand-gout.  Objective: Vital signs in last 24 hours: Temp:  [98.3 F (36.8 C)-99.6 F (37.6 C)] 98.3 F (36.8 C) (03/07 0309) Pulse Rate:  [62-72] 62 (03/07 0309) Cardiac Rhythm: Bundle branch block;Heart block (03/07 0700) Resp:  [18] 18 (03/07 0309) BP: (110-128)/(58-81) 128/81 (03/07 0309) SpO2:  [90 %-97 %] 97 % (03/07 0756) Weight:  [150 lb 3.2 oz (68.1 kg)] 150 lb 3.2 oz (68.1 kg) (03/07 0309)  Pre op weight 70 kg Current Weight  11/01/16 150 lb 3.2 oz (68.1 kg)      Intake/Output from previous day: 03/06 0701 - 03/07 0700 In: 240 [P.O.:240] Out: 401 [Urine:400; Stool:1]   Physical Exam:  Cardiovascular: IRRR, no murmur Pulmonary: Mostly clear bilaterally Abdomen: Soft, non tender, bowel sounds present. Extremities: No lower extremity edema. Wounds: Clean and dry.  No erythema or signs of infection.  Lab Results: CBC:  Recent Labs  10/30/16 0420 11/01/16 0351  WBC 7.2 6.6  HGB 8.4* 8.5*  HCT 25.0* 25.4*  PLT 84* 142*   BMET:   Recent Labs  10/30/16 0420 11/01/16 0351  NA 134* 134*  K 3.7 4.3  CL 100* 98*  CO2 27 27  GLUCOSE 99 134*  BUN 35* 28*  CREATININE 1.85* 1.60*  CALCIUM 8.2* 8.4*    PT/INR:  Lab Results  Component Value Date   INR 1.53 11/01/2016   INR 1.34 10/31/2016   INR 1.25 10/30/2016   ABG:  INR: Will add last result for INR, ABG once components are confirmed Will add last 4 CBG results once components are confirmed  Assessment/Plan:  1. CV -  Previous CHG and continues with AV block WB. Had a flutter yesterday and was unsuccessfully rapid a paced. EPS states his AV conduction appears to  be improving Hold off on PPM for now and monitor.  INR slightly increased from 1.34 to 1.53. Will give 5 mg of Coumadin again tonight. On Milrinone drip and co ox decreased to 46.8 2.  Pulmonary - History of COPD. On room air.  PA/LAT CXR ordered for today. Encourage incentive spirometer. 3. AKI on CKD.Creatinine down to 1.6 4.  Acute blood loss anemia - Last H and H 8.5 and 25.4. Continue Niferex and folic acid. 5. Thrombocytopenia-platelets up to 142,000 6. Volume overload-On Lasix 40 mg IV daily 7. Gout-continue Colchicine and Allopurinol.   ZIMMERMAN,DONIELLE MPA-C 11/01/2016,7:58 AM    I have seen and examined the patient and agree with the assessment and plan as outlined.  Patient appears to remain in CHB with underlying sinus rate 100 beats/min and ventricular escape 60 beats/min.  Mixed venous co-ox down to 46.8% with slow HR.  Given severity of LV dysfunction I do not feel that we can leave him with slow HR.  I have replaced temporary pacer to DDD mode.  Will recheck co-ox with pacer on.  Options include proceeding with PPM today versus continued observation as long as temporary wires work.  Patient wants to go home.    [name], MD 11/01/2016 9:10 AM

## 2016-11-01 NOTE — Discharge Instructions (Addendum)
Activity: 1.May walk up steps                2.No lifting more than ten pounds for two weeks.                 3.No driving for two weeks.                4.Stop any activity that causes chest pain, shortness of breath, dizziness, sweating or excessive weakness.                5.Avoid straining.                6.Continue with your breathing exercises daily.  Diet: Low fat, Low saltl diet  Wound Care: May shower.  Clean wounds with mild soap and water daily. Contact the office at [phone] if any problems arise.   +++ Please note showering restriction for pacemaker implant below     Supplemental Discharge Instructions for  Pacemaker/Defibrillator Patients  Activity No heavy lifting or vigorous activity with your left/right arm for 6 to 8 weeks.  Do not raise your left/right arm above your head for one week.  Gradually raise your affected arm as drawn below.             11/07/16                     11/08/16                   11/09/16                    11/10/16 __  NO DRIVING follow driving restriction as above/by surgery instructions.  WOUND CARE - Keep the wound area clean and dry.  Do not get this area wet, no showers until cleared to at your pacemaker wound check visit . - The tape/steri-strips on your wound will fall off; do not pull them off.  No bandage is needed on the site.  DO  NOT apply any creams, oils, or ointments to the wound area. - If you notice any drainage or discharge from the wound, any swelling or bruising at the site, or you develop a fever > 101? F after you are discharged home, call the office at once.  Special Instructions - You are still able to use cellular telephones; use the ear opposite the side where you have your pacemaker/defibrillator.  Avoid carrying your cellular phone near your device. - When traveling through airports, show security personnel your identification card to avoid being screened in the metal detectors.  Ask the security personnel to  use the hand wand. - Avoid arc welding equipment, MRI testing (magnetic resonance imaging), TENS units (transcutaneous nerve stimulators).  Call the office for questions about other devices. - Avoid electrical appliances that are in poor condition or are not properly grounded. - Microwave ovens are safe to be near or to operate.  Additional information for defibrillator patients should your device go off: - If your device goes off ONCE and you feel fine afterward, notify the device clinic nurses. - If your device goes off ONCE and you do not feel well afterward, call 911. - If your device goes off TWICE, call 911. - If your device goes off THREE times in one day, call 911.  DO NOT DRIVE YOURSELF OR A FAMILY MEMBER WITH A DEFIBRILLATOR TO THE HOSPITAL--CALL 911.   Information on my medicine - Coumadin   (Warfarin)  This medication education was reviewed with me or my healthcare representative as part of my discharge preparation.  The pharmacist that spoke with me during my hospital stay was:  Wayland Salinas, [facility]  Why was Coumadin prescribed for you? Coumadin was prescribed for you because you have a blood clot or a medical condition that can cause an increased risk of forming blood clots. Blood clots can cause serious health problems by blocking the flow of blood to the heart, lung, or brain. Coumadin can prevent harmful blood clots from forming. As a reminder your indication for Coumadin is:   Blood Clot Prevention After Heart Valve Surgery  What test will check on my response to Coumadin? While on Coumadin (warfarin) you will need to have an INR test regularly to ensure that your dose is keeping you in the desired range. The INR (international normalized ratio) number is calculated from the result of the laboratory test called prothrombin time (PT).  If an INR APPOINTMENT HAS NOT ALREADY BEEN MADE FOR YOU please schedule an appointment to have this lab work done by your  health care provider within 7 days. Your INR goal is usually a number between:  2 to 3 or your provider may give you a more narrow range like 2-2.5.  Ask your health care provider during an office visit what your goal INR is.  What  do you need to  know  About  COUMADIN? Take Coumadin (warfarin) exactly as prescribed by your healthcare provider about the same time each day.  DO NOT stop taking without talking to the doctor who prescribed the medication.  Stopping without other blood clot prevention medication to take the place of Coumadin may increase your risk of developing a new clot or stroke.  Get refills before you run out.  What do you do if you miss a dose? If you miss a dose, take it as soon as you remember on the same day then continue your regularly scheduled regimen the next day.  Do not take two doses of Coumadin at the same time.  Important Safety Information A possible side effect of Coumadin (Warfarin) is an increased risk of bleeding. You should call your healthcare provider right away if you experience any of the following: ? Bleeding from an injury or your nose that does not stop. ? Unusual colored urine (red or dark brown) or unusual colored stools (red or black). ? Unusual bruising for unknown reasons. ? A serious fall or if you hit your head (even if there is no bleeding).  Some foods or medicines interact with Coumadin (warfarin) and might alter your response to warfarin. To help avoid this: ? Eat a balanced diet, maintaining a consistent amount of Vitamin K. ? Notify your provider about major diet changes you plan to make. ? Avoid alcohol or limit your intake to 1 drink for women and 2 drinks for men per day. (1 drink is 5 oz. wine, 12 oz. beer, or 1.5 oz. liquor.)  Make sure that ANY health care provider who prescribes medication for you knows that you are taking Coumadin (warfarin).  Also make sure the healthcare provider who is monitoring your Coumadin knows when you  have started a new medication including herbals and non-prescription products.  Coumadin (Warfarin)  Major Drug Interactions  Increased Warfarin Effect Decreased Warfarin Effect  Alcohol (large quantities) Antibiotics (esp. Septra/Bactrim, Flagyl, Cipro) Amiodarone (Cordarone) Aspirin (ASA) Cimetidine (Tagamet) Megestrol (Megace) NSAIDs (ibuprofen, naproxen, etc.) Piroxicam (Feldene) Propafenone (Rythmol SR) Propranolol (  Inderal) Isoniazid (INH) Posaconazole (Noxafil) Barbiturates (Phenobarbital) Carbamazepine (Tegretol) Chlordiazepoxide (Librium) Cholestyramine (Questran) Griseofulvin Oral Contraceptives Rifampin Sucralfate (Carafate) Vitamin K   Coumadin (Warfarin) Major Herbal Interactions  Increased Warfarin Effect Decreased Warfarin Effect  Garlic Ginseng Ginkgo biloba Coenzyme Q10 Green tea St. Johns wort    Coumadin (Warfarin) FOOD Interactions  Eat a consistent number of servings per week of foods HIGH in Vitamin K (1 serving =  cup)  Collards (cooked, or boiled & drained) Kale (cooked, or boiled & drained) Mustard greens (cooked, or boiled & drained) Parsley *serving size only =  cup Spinach (cooked, or boiled & drained) Swiss chard (cooked, or boiled & drained) Turnip greens (cooked, or boiled & drained)  Eat a consistent number of servings per week of foods MEDIUM-HIGH in Vitamin K (1 serving = 1 cup)  Asparagus (cooked, or boiled & drained) Broccoli (cooked, boiled & drained, or raw & chopped) Brussel sprouts (cooked, or boiled & drained) *serving size only =  cup Lettuce, raw (green leaf, endive, romaine) Spinach, raw Turnip greens, raw & chopped   These websites have more information on Coumadin (warfarin):  FailFactory.se; VeganReport.com.au;

## 2016-11-01 NOTE — Progress Notes (Signed)
Patient ID: Aaron Bennett, male   DOB: November 11, 1950, 66 y.o.   MRN: [id number]  EP Followup  Patient ambulated today with increase in HR into the 80's. On my arrival to floor this afternoon, he has 4:3, 5:4, and occaisionally 3:2 AVWB. He appears to me to have ongoing improvement in his AV conduction. While EP service is happy to place a PPM, at current time, I strongly suspect a PPM will not help him. If you wean off milrinone and his conduction worsens, then PPM insertion is indicated. Currently he is not at risk for any life threatening bradycardia and I would leave his temp ppm in a backup VVI 40 mode. We will reassess in the morning.   Mikle Bosworth.D.

## 2016-11-01 NOTE — Progress Notes (Signed)
Progress Note  Patient Name: Aaron Bennett Date of Encounter: 11/01/2016  Primary Cardiologist: Dr. French Ana  Subjective   No complaints this morning  Inpatient Medications    Scheduled Meds: . aspirin EC  81 mg Oral Daily  . bisacodyl  10 mg Oral Daily   Or  . bisacodyl  10 mg Rectal Daily  . colchicine  0.6 mg Oral BID  . docusate sodium  200 mg Oral Daily  . enoxaparin (LOVENOX) injection  30 mg Subcutaneous Q24H  . folic acid  1 mg Oral Daily  . furosemide  40 mg Intravenous Daily  . guaiFENesin  600 mg Oral BID  . ipratropium-albuterol  3 mL Nebulization TID  . iron polysaccharides  150 mg Oral Daily  . pantoprazole  40 mg Oral Daily  . patient's guide to using coumadin book   Does not apply Once  . predniSONE  10 mg Oral Nightly  . predniSONE  5 mg Oral 3 x daily with food  . [START ON 11/02/2016] predniSONE  5 mg Oral 4X daily taper  . sodium chloride flush  10-40 mL Intracatheter Q12H  . thiamine  100 mg Oral Daily  . warfarin  5 mg Oral q1800  . warfarin   Does not apply Once  . Warfarin - Physician Dosing Inpatient   Does not apply q1800   Continuous Infusions: . sodium chloride 250 mL (10/31/16 1818)  . milrinone 0.125 mcg/kg/min (10/31/16 1046)   PRN Meds: ipratropium-albuterol, metoprolol, morphine injection, ondansetron (ZOFRAN) IV, oxyCODONE, sodium chloride flush, traMADol   Vital Signs    Vitals:   10/31/16 1430 10/31/16 2125 10/31/16 2144 11/01/16 0309  BP:  110/60  128/81  Pulse: 70 72  62  Resp: 18 18  18   Temp:  99 F (37.2 C)  98.3 F (36.8 C)  TempSrc:  Oral  Oral  SpO2: 92% 93% 93% 94%  Weight:    150 lb 3.2 oz (68.1 kg)  Height:        Intake/Output Summary (Last 24 hours) at 11/01/16 0742 Last data filed at 11/01/16 0700  Gross per 24 hour  Intake              240 ml  Output              401 ml  Net             -161 ml   Filed Weights   10/30/16 0500 10/31/16 0512 11/01/16 0309  Weight: 154 lb 1.6 oz (69.9 kg) 152 lb  4.8 oz (69.1 kg) 150 lb 3.2 oz (68.1 kg)    Telemetry    NSR with AV WB - Personally Reviewed  ECG    NSR with AV WB - Personally Reviewed  Physical Exam   GEN: No acute distress.   Neck: 6 cm JVD Cardiac: RRR, no murmurs, rubs, or gallops.  Respiratory: Clear to auscultation bilaterally. GI: Soft, nontender, non-distended  MS: No edema; No deformity. Neuro:  Nonfocal  Psych: Normal affect   Labs    Chemistry Recent Labs Lab 10/27/16 0353  10/29/16 0345 10/30/16 0420 11/01/16 0351  NA 135  < > 132* 134* 134*  K 4.7  < > 3.6 3.7 4.3  CL 101  < > 96* 100* 98*  CO2 22  < > 27 27 27   GLUCOSE 143*  < > 130* 99 134*  BUN 27*  < > 43* 35* 28*  CREATININE 2.39*  < > 2.37* 1.85*  1.60*  CALCIUM 8.5*  < > 8.2* 8.2* 8.4*  PROT 5.9*  --   --   --   --   ALBUMIN 3.1*  --   --   --   --   AST 49*  --   --   --   --   ALT 10*  --   --   --   --   ALKPHOS 36*  --   --   --   --   BILITOT 1.0  --   --   --   --   GFRNONAA 27*  < > 27* 37* 44*  GFRAA 31*  < > 31* 42* 51*  ANIONGAP 12  < > 9 7 9   < > = values in this interval not displayed.   Hematology Recent Labs Lab 10/29/16 0345 10/30/16 0420 11/01/16 0351  WBC 6.4 7.2 6.6  RBC 2.87* 2.63* 2.68*  HGB 8.8* 8.4* 8.5*  HCT 26.8* 25.0* 25.4*  MCV 93.4 95.1 94.8  MCH 30.7 31.9 31.7  MCHC 32.8 33.6 33.5  RDW 15.4 15.3 15.3  PLT 74* 84* 142*    Cardiac EnzymesNo results for input(s): TROPONINI in the last 168 hours. No results for input(s): TROPIPOC in the last 168 hours.   BNPNo results for input(s): BNP, PROBNP in the last 168 hours.   DDimer No results for input(s): DDIMER in the last 168 hours.   Radiology    No results found.  Cardiac Studies    Patient Profile     66 y.o. male s/p MVR/TVR who had CHB initially post op, then atrial flutter, now with AVWB  Assessment & Plan    1. AV block - his AV conduction appears to be improving. I will discuss with Dr. Roxy Manns, though it looks to me like he might be  able to avoid PPM insertion. I would suggest he be allowed to ambulate to confirm that his HR will increase appropriately. If he were to develop bradycardia with exertion (worsening heart block as the sinus node sped up) then he would still need PPM. 2. S/p MVR/TVR - stable post op course.  Signed, Cristopher Peru, MD  11/01/2016, 7:42 AM  Patient ID: Aaron Bennett, male   DOB: Sep 14, 1950, 66 y.o.   MRN: [id number]

## 2016-11-02 LAB — COOXEMETRY PANEL
Carboxyhemoglobin: 1.2 % (ref 0.5–1.5)
Methemoglobin: 1.1 % (ref 0.0–1.5)
O2 SAT: 50.5 %
Total hemoglobin: 9.1 g/dL — ABNORMAL LOW (ref 12.0–16.0)

## 2016-11-02 LAB — PROTIME-INR
INR: 1.53
Prothrombin Time: 18.6 seconds — ABNORMAL HIGH (ref 11.4–15.2)

## 2016-11-02 MED ORDER — FUROSEMIDE 40 MG PO TABS
40.0000 mg | ORAL_TABLET | Freq: Every day | ORAL | Status: DC
  Administered 2016-11-03 – 2016-11-04 (×2): 40 mg via ORAL
  Filled 2016-11-02 (×2): qty 1

## 2016-11-02 MED ORDER — WARFARIN SODIUM 7.5 MG PO TABS
7.5000 mg | ORAL_TABLET | Freq: Every day | ORAL | Status: DC
  Administered 2016-11-02: 7.5 mg via ORAL
  Filled 2016-11-02: qty 1

## 2016-11-02 MED ORDER — FUROSEMIDE 40 MG PO TABS: 40.0000 mg | ORAL_TABLET | Freq: Every day | ORAL | Status: DC

## 2016-11-02 MED ORDER — POTASSIUM CHLORIDE CRYS ER 20 MEQ PO TBCR
20.0000 meq | EXTENDED_RELEASE_TABLET | Freq: Every day | ORAL | Status: DC
  Administered 2016-11-03 – 2016-11-04 (×2): 20 meq via ORAL
  Filled 2016-11-02 (×2): qty 1

## 2016-11-02 MED ORDER — COLCHICINE 0.6 MG PO TABS
0.6000 mg | ORAL_TABLET | Freq: Every day | ORAL | Status: DC
  Administered 2016-11-03 – 2016-11-04 (×2): 0.6 mg via ORAL
  Filled 2016-11-02 (×2): qty 1

## 2016-11-02 NOTE — Progress Notes (Addendum)
      [address]       [place] [address]             [phone]        8 Days Post-Op Procedure(s) (LRB): MINIMALLY INVASIVE TRICUSPID VALVE REPAIR (Right) TRANSESOPHAGEAL ECHOCARDIOGRAM (TEE) (N/A) MINIMALLY INVASIVE MITRAL VALVE (MV) REPLACEMENT (Right)  Subjective: He really wants to go home.  Objective: Vital signs in last 24 hours: Temp:  [98.1 F (36.7 C)-98.4 F (36.9 C)] 98.1 F (36.7 C) (03/08 0540) Pulse Rate:  [61-78] 61 (03/08 0540) Cardiac Rhythm: Heart block (03/07 1900) Resp:  [16-18] 18 (03/08 0540) BP: (118-123)/(78-86) 123/86 (03/08 0540) SpO2:  [95 %-100 %] 100 % (03/08 0540) Weight:  [148 lb 4.8 oz (67.3 kg)] 148 lb 4.8 oz (67.3 kg) (03/08 0540)  Pre op weight 70 kg Current Weight  11/02/16 148 lb 4.8 oz (67.3 kg)      Intake/Output from previous day: 03/07 0701 - 03/08 0700 In: 20 [P.O.:20] Out: 1725 [Urine:1725]   Physical Exam:  Cardiovascular: RRR, no murmur Pulmonary: Clear to auscultation bilaterally Abdomen: Soft, non tender, bowel sounds present. Extremities: No lower extremity edema. Wounds: Clean and dry.  No erythema or signs of infection.  Lab Results: CBC:  Recent Labs  11/01/16 0351  WBC 6.6  HGB 8.5*  HCT 25.4*  PLT 142*   BMET:   Recent Labs  11/01/16 0351  NA 134*  K 4.3  CL 98*  CO2 27  GLUCOSE 134*  BUN 28*  CREATININE 1.60*  CALCIUM 8.4*    PT/INR:  Lab Results  Component Value Date   INR 1.53 11/02/2016   INR 1.53 11/01/2016   INR 1.34 10/31/2016   ABG:  INR: Will add last result for INR, ABG once components are confirmed Will add last 4 CBG results once components are confirmed  Assessment/Plan:  1. CV -  Previous CHG and continues with AV block varying WB.  EPS states his AV conduction appears to be improving and recommend to hold off on PPM for now;however, if conduction worsens as weaned off of Milrinone, would place PPM. INR remains 1.54 so will increase  Coumadin. On Milrinone drip and co ox increased to 50.5. 2.  Pulmonary - History of COPD. On room air. Encourage incentive spirometer. 3. AKI on CKD.Last creatinine down to 1.6 4.  Acute blood loss anemia - Last H and H 8.5 and 25.4. Continue Niferex and folic acid. 5. Thrombocytopenia-last platelets up to 142,000 6. Volume overload-On Lasix 40 mg IV daily. Will discuss with Dr. Roxy Manns when to transition to oral. 7. Gout-continue Colchicine and Allopurinol.   ZIMMERMAN,DONIELLE MPA-C 11/02/2016,7:48 AM   I have seen and examined the patient and agree with the assessment and plan as outlined.  HR improved some and co-ox up slightly, although he remains in at least 2nd degree AV block.   Will stop milrinone and observe.  Change lasix to oral.  Decrease colchicine to daily and stop steroids.    [name], MD 11/02/2016 8:57 AM

## 2016-11-02 NOTE — Progress Notes (Signed)
Patient in a flutter rate controlled, Tommye Standard PA, notified, patient in no distress will continue to monitor.

## 2016-11-02 NOTE — Care Management Important Message (Signed)
Important Message  Patient Details  Name: Aaron Bennett MRN: [id number] Date of Birth: Feb 22, 1951   Medicare Important Message Given:  Yes    Nathen May 11/02/2016, 3:18 PM

## 2016-11-02 NOTE — Progress Notes (Signed)
SUBJECTIVE: The patient is doing well today.  At this time, he denies chest pain, shortness of breath, or any new concerns, sitting at the side of the bed eating breakfast.  . aspirin EC  81 mg Oral Daily  . bisacodyl  10 mg Oral Daily   Or  . bisacodyl  10 mg Rectal Daily  . colchicine  0.6 mg Oral BID  . docusate sodium  200 mg Oral Daily  . enoxaparin (LOVENOX) injection  30 mg Subcutaneous Q24H  . folic acid  1 mg Oral Daily  . furosemide  40 mg Intravenous Daily  . guaiFENesin  600 mg Oral BID  . ipratropium-albuterol  3 mL Nebulization TID  . iron polysaccharides  150 mg Oral Daily  . pantoprazole  40 mg Oral Daily  . patient's guide to using coumadin book   Does not apply Once  . predniSONE  5 mg Oral 4X daily taper  . sodium chloride flush  10-40 mL Intracatheter Q12H  . thiamine  100 mg Oral Daily  . warfarin  7.5 mg Oral q1800  . warfarin   Does not apply Once  . Warfarin - Physician Dosing Inpatient   Does not apply q1800   . sodium chloride 250 mL (10/31/16 1818)  . milrinone 0.125 mcg/kg/min (11/01/16 1612)    OBJECTIVE: Physical Exam: Vitals:   11/01/16 2032 11/01/16 2147 11/02/16 0540 11/02/16 0828  BP:  122/81 123/86 114/75  Pulse:  78 61 67  Resp:  16 18 18   Temp:  98.2 F (36.8 C) 98.1 F (36.7 C) 98.7 F (37.1 C)  TempSrc:  Oral Oral Oral  SpO2: 97% 98% 100%   Weight:   148 lb 4.8 oz (67.3 kg)   Height:        Intake/Output Summary (Last 24 hours) at 11/02/16 0835 Last data filed at 11/02/16 0800  Gross per 24 hour  Intake              260 ml  Output             2225 ml  Net            -1965 ml    Telemetry is reviewed with Dr. Lovena Le reveals SR with Mobitz one (wenckebach) with 3:2 ands 4:3 conduction HR 70's this morning.  GEN- The patient is well appearing, alert and oriented x 3 today.   Head- normocephalic, atraumatic Eyes-  Sclera clear, conjunctiva pink Ears- hearing intact Oropharynx- clear Neck- supple, no JVP Lungs- CTA  b/l, normal work of breathing Heart- iRRR, no significant murmurs, no rubs or gallops GI- soft, NT, ND Extremities- no clubbing, cyanosis, or edema Skin- no rash or lesion Psych- euthymic mood, full affect Neuro- no gross deficits appreciated   LABS: Basic Metabolic Panel:  Recent Labs  11/01/16 0351  NA 134*  K 4.3  CL 98*  CO2 27  GLUCOSE 134*  BUN 28*  CREATININE 1.60*  CALCIUM 8.4*   CBC:  Recent Labs  11/01/16 0351  WBC 6.6  HGB 8.5*  HCT 25.4*  MCV 94.8  PLT 142*     ASSESSMENT AND PLAN:   1. Post-op CHB     POD #8 today s/p minimally invasive bioprosthetic MVR and TV repair     Amiodarone gtt stopped 10/27/16 AM (per RN 2/2 episode of  NSVT )     No nodal blocking or rate limiting medicines since     Remains with temp pacing wires  his conduction continues to improve, with Mobitz I block, HR 70's this morning, with ambulation yesterday HR increased to 80-83 range without worsening of conduction, and without symptoms.      He remains on Milrinone, would allow to follow his HR/conduction off this medicine when able, if worsening conduction would need PPM, if his conduction remains as it is, would not recommend pacing.  2. Post-op PAFlutter     Started on warfarin      INR 1.53, c/w CTS  3. VHD     POD#8 minimally invasive bioprosthetic MVR and TV repair     P/o anemia     P/u subq emphysema  4. CHF, NICM     remains on milrinone     Cumulative negative -[phone] since admit     Continue with CTS service      5. COPD     severe pulm. HTN known for patient  6. AKI on CKD     improving  EP Attending  Patient seen and examined. Agree with above. He remains with AVWB with ventricular rates in the 70's. Would recommend weaning milrinone to off. Will follow. If his conduction remains as is, would not pace. I would expect his AV conduction to continue to improve.  Indira Sorenson,M.D.   Tommye Standard, PA-C 11/02/2016 8:35 AM

## 2016-11-02 NOTE — Progress Notes (Signed)
CARDIAC REHAB PHASE I   PRE:  Rate/Rhythm: 83 POD  BP:  Supine:   Sitting: 94/70  Standing:    SaO2: 94 RA  MODE:  Ambulation: 550 ft   POST:  Rate/Rhythm: 83 POD  BP:  Supine:   Sitting: 113/86  Standing:    SaO2: 92 RA 0945-1015 Assisted X 1 and used walker to ambulate. Gait steady with walker. VS stable Pt increased distance to 550 feet. He states that he is feeling better today. Pt back to side of bed after walk with call light in reach. I encouraged use of IS hourly.  Rodney Langton RN 11/02/2016 10:15 AM

## 2016-11-03 ENCOUNTER — Encounter (HOSPITAL_COMMUNITY)
Admission: RE | Disposition: A | Discharge status: Home / Self Care | Payer: Self-pay | Source: Ambulatory Visit | Attending: Thoracic Surgery (Cardiothoracic Vascular Surgery)

## 2016-11-03 DIAGNOSIS — I442 Atrioventricular block, complete: Secondary | ICD-10-CM

## 2016-11-03 HISTORY — PX: BIV PACEMAKER INSERTION CRT-P: EP1199

## 2016-11-03 LAB — COOXEMETRY PANEL
Carboxyhemoglobin: 1.2 % (ref 0.5–1.5)
Methemoglobin: 1.2 % (ref 0.0–1.5)
O2 Saturation: 51.9 %
Total hemoglobin: 9.6 g/dL — ABNORMAL LOW (ref 12.0–16.0)

## 2016-11-03 LAB — PROTIME-INR
INR: 1.65
PROTHROMBIN TIME: 19.8 s — AB (ref 11.4–15.2)

## 2016-11-03 SURGERY — BIV PACEMAKER INSERTION CRT-P
Anesthesia: LOCAL

## 2016-11-03 MED ORDER — IOPAMIDOL (ISOVUE-370) INJECTION 76%
INTRAVENOUS | Status: DC | PRN
  Administered 2016-11-03: 50 mL

## 2016-11-03 MED ORDER — ACETAMINOPHEN 325 MG PO TABS: 325.0000 mg | ORAL_TABLET | ORAL | Status: DC | PRN

## 2016-11-03 MED ORDER — AMIODARONE HCL IN DEXTROSE 360-4.14 MG/200ML-% IV SOLN
60.0000 mg/h | INTRAVENOUS | Status: AC
  Administered 2016-11-03 (×2): 60 mg/h via INTRAVENOUS
  Filled 2016-11-03 (×2): qty 200

## 2016-11-03 MED ORDER — MIDAZOLAM HCL 5 MG/5ML IJ SOLN
INTRAMUSCULAR | Status: AC
  Filled 2016-11-03: qty 5

## 2016-11-03 MED ORDER — SODIUM CHLORIDE 0.9% FLUSH: 3.0000 mL | Freq: Two times a day (BID) | INTRAVENOUS | Status: DC

## 2016-11-03 MED ORDER — SODIUM CHLORIDE 0.9 % IV SOLN: 250.0000 mL | INTRAVENOUS | Status: DC

## 2016-11-03 MED ORDER — CARVEDILOL 6.25 MG PO TABS
6.2500 mg | ORAL_TABLET | Freq: Two times a day (BID) | ORAL | Status: DC
  Administered 2016-11-03 – 2016-11-04 (×2): 6.25 mg via ORAL
  Filled 2016-11-03 (×3): qty 1

## 2016-11-03 MED ORDER — FENTANYL CITRATE (PF) 100 MCG/2ML IJ SOLN
INTRAMUSCULAR | Status: AC
  Filled 2016-11-03: qty 2

## 2016-11-03 MED ORDER — LIDOCAINE HCL (PF) 1 % IJ SOLN
INTRAMUSCULAR | Status: AC
  Filled 2016-11-03: qty 30

## 2016-11-03 MED ORDER — SODIUM CHLORIDE 0.9 % IR SOLN
80.0000 mg | Status: AC
  Administered 2016-11-03: 80 mg

## 2016-11-03 MED ORDER — AMIODARONE HCL IN DEXTROSE 360-4.14 MG/200ML-% IV SOLN
30.0000 mg/h | INTRAVENOUS | Status: DC
  Administered 2016-11-04: 30 mg/h via INTRAVENOUS
  Filled 2016-11-03: qty 200

## 2016-11-03 MED ORDER — ONDANSETRON HCL 4 MG/2ML IJ SOLN: 4.0000 mg | Freq: Four times a day (QID) | INTRAMUSCULAR | Status: DC | PRN

## 2016-11-03 MED ORDER — SODIUM CHLORIDE 0.9% FLUSH
3.0000 mL | INTRAVENOUS | Status: DC | PRN
Start: 2016-11-03 — End: 2016-11-03

## 2016-11-03 MED ORDER — FENTANYL CITRATE (PF) 100 MCG/2ML IJ SOLN
INTRAMUSCULAR | Status: DC | PRN
  Administered 2016-11-03 (×3): 12.5 ug via INTRAVENOUS
  Administered 2016-11-03: 25 ug via INTRAVENOUS
  Administered 2016-11-03: 12.5 ug via INTRAVENOUS
  Administered 2016-11-03: 25 ug via INTRAVENOUS

## 2016-11-03 MED ORDER — CEFAZOLIN SODIUM-DEXTROSE 2-4 GM/100ML-% IV SOLN
2.0000 g | INTRAVENOUS | Status: AC
  Administered 2016-11-03: 2 g via INTRAVENOUS

## 2016-11-03 MED ORDER — MIDAZOLAM HCL 5 MG/5ML IJ SOLN
INTRAMUSCULAR | Status: AC
Start: 2016-11-03 — End: 2016-11-03
  Filled 2016-11-03: qty 5

## 2016-11-03 MED ORDER — SODIUM CHLORIDE 0.9 % IR SOLN
Status: AC
  Filled 2016-11-03: qty 2

## 2016-11-03 MED ORDER — HEPARIN (PORCINE) IN NACL 2-0.9 UNIT/ML-% IJ SOLN
INTRAMUSCULAR | Status: AC
  Filled 2016-11-03: qty 1000

## 2016-11-03 MED ORDER — SODIUM CHLORIDE 0.9 % IV SOLN
INTRAVENOUS | Status: DC
  Administered 2016-11-03: 10:00:00 via INTRAVENOUS

## 2016-11-03 MED ORDER — AMIODARONE LOAD VIA INFUSION
150.0000 mg | Freq: Once | INTRAVENOUS | Status: AC
  Administered 2016-11-03: 150 mg via INTRAVENOUS
  Filled 2016-11-03: qty 83.34

## 2016-11-03 MED ORDER — IOPAMIDOL (ISOVUE-370) INJECTION 76%
INTRAVENOUS | Status: AC
  Filled 2016-11-03: qty 50

## 2016-11-03 MED ORDER — HEPARIN (PORCINE) IN NACL 2-0.9 UNIT/ML-% IJ SOLN
INTRAMUSCULAR | Status: DC | PRN
  Administered 2016-11-03: 1000 mL

## 2016-11-03 MED ORDER — MIDAZOLAM HCL 5 MG/5ML IJ SOLN
INTRAMUSCULAR | Status: DC | PRN
  Administered 2016-11-03 (×6): 1 mg via INTRAVENOUS

## 2016-11-03 MED ORDER — LIDOCAINE HCL (PF) 1 % IJ SOLN
INTRAMUSCULAR | Status: DC | PRN
  Administered 2016-11-03: 35 mL

## 2016-11-03 MED ORDER — CEFAZOLIN SODIUM-DEXTROSE 2-4 GM/100ML-% IV SOLN
INTRAVENOUS | Status: AC
  Filled 2016-11-03: qty 100

## 2016-11-03 MED ORDER — AMIODARONE HCL 200 MG PO TABS
200.0000 mg | ORAL_TABLET | Freq: Two times a day (BID) | ORAL | Status: DC
  Administered 2016-11-04: 200 mg via ORAL
  Filled 2016-11-03: qty 1

## 2016-11-03 MED ORDER — AMIODARONE LOAD VIA INFUSION
150.0000 mg | Freq: Once | INTRAVENOUS | Status: DC
  Filled 2016-11-03: qty 83.34

## 2016-11-03 MED ORDER — CEFAZOLIN IN D5W 1 GM/50ML IV SOLN
1.0000 g | Freq: Three times a day (TID) | INTRAVENOUS | Status: AC
  Administered 2016-11-03 – 2016-11-04 (×3): 1 g via INTRAVENOUS
  Filled 2016-11-03 (×3): qty 50

## 2016-11-03 MED ORDER — CHLORHEXIDINE GLUCONATE 4 % EX LIQD
60.0000 mL | Freq: Once | CUTANEOUS | Status: AC
  Administered 2016-11-03: 4 via TOPICAL
  Filled 2016-11-03: qty 15

## 2016-11-03 MED ORDER — CHLORHEXIDINE GLUCONATE 4 % EX LIQD
60.0000 mL | Freq: Once | CUTANEOUS | Status: AC
  Administered 2016-11-03: 4 via TOPICAL
  Filled 2016-11-03: qty 60

## 2016-11-03 SURGICAL SUPPLY — 27 items
BALLN COR SINUS VENO 6FR 80 (BALLOONS) ×2
BALLOON COR SINUS VENO 6FR 80 (BALLOONS) ×1 IMPLANT
CABLE SURGICAL S-101-97-12 (CABLE) ×2 IMPLANT
CATH ATTAIN SELECT 6238TEL (CATHETERS) ×2 IMPLANT
CATH CPS DIRECT 135 DS2C020 (CATHETERS) ×2 IMPLANT
CATH CPS QUART SUB DS2N026-59 (CATHETERS) ×2 IMPLANT
CATH CPS QUART SUB DS2N027-59 (CATHETERS) ×2 IMPLANT
CATH RIGHTSITE C315HIS02 (CATHETERS) ×2 IMPLANT
CPS IMPLANT KIT 410190 (MISCELLANEOUS) ×2 IMPLANT
KIT ESSENTIALS PG (KITS) ×2 IMPLANT
LEAD SELECT SECURE 3830 383069 (Lead) ×1 IMPLANT
LEAD TENDRIL MRI 52CM LPA1200M (Lead) ×2 IMPLANT
LEAD TENDRIL MRI 58CM LPA1200M (Lead) ×2 IMPLANT
PACEMAKER ALLR CRT-P RF PM3222 (Pacemaker) ×1 IMPLANT
PAD DEFIB LIFELINK (PAD) ×2 IMPLANT
PPM ALLURE CRT-P RF PM3222 (Pacemaker) ×2 IMPLANT
SELECT SECURE 3830 383069 (Lead) ×2 IMPLANT
SHEATH CLASSIC 8F (SHEATH) ×4 IMPLANT
SHEATH CLASSIC 9F (SHEATH) ×2 IMPLANT
SHEATH WORLEY 9FR 62CM (SHEATH) ×2 IMPLANT
SLITTER 6232ADJ (MISCELLANEOUS) ×2 IMPLANT
TRAY PACEMAKER INSERTION (PACKS) ×2 IMPLANT
WIRE ACUITY WHISPER EDS 4648 (WIRE) ×2 IMPLANT
WIRE ASAHI SOFT 180CM (WIRE) ×2 IMPLANT
WIRE HI TORQ VERSACORE-J 145CM (WIRE) ×2 IMPLANT
WIRE LUGE 182CM (WIRE) ×2 IMPLANT
WIRE MAILMAN 182CM (WIRE) ×2 IMPLANT

## 2016-11-03 NOTE — Progress Notes (Signed)
CARDIAC REHAB PHASE I   PRE:  Rate/Rhythm: 70 pacing  BP:  Supine:   Sitting: 93/65  Standing:    SaO2: 94%RA  MODE:  Ambulation: 550 ft   POST:  Rate/Rhythm: 70 pacing  BP:  Supine:   Sitting: 127/75  Standing:    SaO2: 94%RA 1047-1105 Pt walked 550 ft on RA with rolling walker and I managed pacer and IV. Pt tolerated well. To sitting on side of bed after walk.    Graylon Good, RN BSN  11/03/2016 11:56 AM

## 2016-11-03 NOTE — Progress Notes (Signed)
SUBJECTIVE: The patient is doing well today.  At this time, he denies chest pain, shortness of breath, or any new concerns, sitting at the side of the bed washing up, R hand pain is steadily improving.  Derrill Memo ON 11/04/2016] amiodarone  200 mg Oral BID PC  . aspirin EC  81 mg Oral Daily  . bisacodyl  10 mg Oral Daily   Or  . bisacodyl  10 mg Rectal Daily  . carvedilol  6.25 mg Oral BID WC  .  ceFAZolin (ANCEF) IV  2 g Intravenous On Call  . chlorhexidine  60 mL Topical Once  . chlorhexidine  60 mL Topical Once  . colchicine  0.6 mg Oral Daily  . docusate sodium  200 mg Oral Daily  . folic acid  1 mg Oral Daily  . furosemide  40 mg Oral Daily  . gentamicin irrigation  80 mg Irrigation On Call  . guaiFENesin  600 mg Oral BID  . iron polysaccharides  150 mg Oral Daily  . pantoprazole  40 mg Oral Daily  . patient's guide to using coumadin book   Does not apply Once  . potassium chloride  20 mEq Oral Daily  . sodium chloride flush  10-40 mL Intracatheter Q12H  . sodium chloride flush  3 mL Intravenous Q12H  . thiamine  100 mg Oral Daily  . warfarin  7.5 mg Oral q1800  . warfarin   Does not apply Once  . Warfarin - Physician Dosing Inpatient   Does not apply q1800   . sodium chloride 250 mL (10/31/16 1818)  . sodium chloride    . sodium chloride      OBJECTIVE: Physical Exam: Vitals:   11/02/16 1140 11/02/16 1450 11/02/16 2025 11/03/16 0342  BP: 98/64 110/72 105/62 111/63  Pulse: 73 68 61 61  Resp:  18 18 18   Temp:   98.4 F (36.9 C) 98.4 F (36.9 C)  TempSrc:   Oral Oral  SpO2:  97% 99% 95%  Weight:    146 lb 9.6 oz (66.5 kg)  Height:        Intake/Output Summary (Last 24 hours) at 11/03/16 0846 Last data filed at 11/03/16 1443  Gross per 24 hour  Intake              810 ml  Output             1250 ml  Net             -440 ml    Telemetry is reviewed by Dr. Lovena Le reveals Aflutter, intermittently 40's, paced overnight.  GEN- The patient is well appearing,  alert and oriented x 3 today.   Head- normocephalic, atraumatic Eyes-  Sclera clear, conjunctiva pink Ears- hearing intact Oropharynx- clear Neck- supple, no JVP Lungs- CTA b/l, normal work of breathing Heart- iRRR, no significant murmurs, no rubs or gallops GI- soft, NT, ND Extremities- no clubbing, cyanosis, or edema Skin- no rash or lesion Psych- euthymic mood, full affect Neuro- no gross deficits appreciated   LABS: Basic Metabolic Panel:  Recent Labs  11/01/16 0351  NA 134*  K 4.3  CL 98*  CO2 27  GLUCOSE 134*  BUN 28*  CREATININE 1.60*  CALCIUM 8.4*   CBC:  Recent Labs  11/01/16 0351  WBC 6.6  HGB 8.5*  HCT 25.4*  MCV 94.8  PLT 142*     ASSESSMENT AND PLAN:   1. Post-op CHB  POD #9 today s/p minimally invasive bioprosthetic MVR and TV repair     Amiodarone gtt stopped 10/27/16 AM (per RN 2/2 episode of  NSVT )     No nodal blocking or rate limiting medicines since     Remains with temp pacing wires     recurrent AFlutter yesterday, rates overnight this AM, 40's, temp pacing turned back up to 70     Dr. Lovena Le was unable to pace terminate AFlutter this morning     Will need pacer     Dr. Lovena Le discussed with patient, Bive PPM implant, risks/benefits of the procedure, the patient is agreeable to proceeed  2. Post-op PAFlutter     Started on warfarin      INR 1.65, c/w CTS  3. VHD     POD#8 minimally invasive bioprosthetic MVR and TV repair     P/o anemia     P/o subq emphysema  4. CHF, NICM     off milrinone     Cumulative negative -4967 since admit     Continue with CTS service      5. COPD     severe pulm. HTN known for patient  6. AKI on CKD     improving   Jodelle Green 11/03/2016 8:46 AM  EP Attending  Patient seen and examined. Agree with above. I have tried to pace him out of atrial flutter without success. Off of milrinone his ventricular response varies from 40's and heart block to the 60's. In light of his low  output state, I have recommended proceeding with insertion of a biv pacemaker as I anticipate he will require pacing essentially 100% of the time.   Mikle Bosworth.D.

## 2016-11-03 NOTE — Progress Notes (Addendum)
      [address]       [place] [address]             [phone]      9 Days Post-Op Procedure(s) (LRB): MINIMALLY INVASIVE TRICUSPID VALVE REPAIR (Right) TRANSESOPHAGEAL ECHOCARDIOGRAM (TEE) (N/A) MINIMALLY INVASIVE MITRAL VALVE (MV) REPLACEMENT (Right)   Subjective:  No acute complaints.  Wants to go home.  Objective: Vital signs in last 24 hours: Temp:  [98.4 F (36.9 C)-98.7 F (37.1 C)] 98.4 F (36.9 C) (03/09 0342) Pulse Rate:  [61-73] 61 (03/09 0342) Cardiac Rhythm: Atrial flutter;Other (Comment) (03/08 1900) Resp:  [18] 18 (03/09 0342) BP: (98-114)/(62-75) 111/63 (03/09 0342) SpO2:  [95 %-100 %] 95 % (03/09 0342) Weight:  [146 lb 9.6 oz (66.5 kg)] 146 lb 9.6 oz (66.5 kg) (03/09 0342)  Intake/Output from previous day: 03/08 0701 - 03/09 0700 In: 730 [P.O.:720; I.V.:10] Out: 1500 [Urine:1500]  General appearance: alert, cooperative and no distress Heart: regularly irregular rhythm Lungs: clear to auscultation bilaterally Abdomen: soft, non-tender; bowel sounds normal; no masses,  no organomegaly Extremities: edema none present Wound: clean and dry  Lab Results:  Recent Labs  11/01/16 0351  WBC 6.6  HGB 8.5*  HCT 25.4*  PLT 142*   BMET:  Recent Labs  11/01/16 0351  NA 134*  K 4.3  CL 98*  CO2 27  GLUCOSE 134*  BUN 28*  CREATININE 1.60*  CALCIUM 8.4*    PT/INR:  Recent Labs  11/03/16 0420  LABPROT 19.8*  INR 1.65   ABG    Component Value Date/Time   PHART 7.389 10/27/2016 0302   HCO3 23.2 10/27/2016 0302   TCO2 22 10/26/2016 1650   ACIDBASEDEF 1.1 10/27/2016 0302   O2SAT 51.9 11/03/2016 0430   CBG (last 3)  No results for input(s): GLUCAP in the last 72 hours.  Assessment/Plan: S/P Procedure(s) (LRB): MINIMALLY INVASIVE TRICUSPID VALVE REPAIR (Right) TRANSESOPHAGEAL ECHOCARDIOGRAM (TEE) (N/A) MINIMALLY INVASIVE MITRAL VALVE (MV) REPLACEMENT (Right)  1. CV- A. Flutter rate in the 90s- awaiting EPs final  recommendations in regards to pacer... Will need to remove pacing wires at some point as INR is trending up 2. Pulm- no acute issues, off oxygen 3. Renal- creatinine stable, weight is below baseline, on oral lasix 4. Gout- steroids stopped, continue Colchicine 5. Dispo- patient stable, A. Flutter this morning.. Awaiting EP recs in regards to need for pacer, they are hoping he is recovering, not ready for d/c today   LOS: 9 days    BARRETT, ERIN 11/03/2016   I have seen and examined the patient and agree with the assessment and plan as outlined.  Back in Aflutter overnight.  For PPM placement later today per Dr. Lovena Le.  Resume amiodarone and carvedilol once pacer in place.  Possible d/c home 1-2 days.  [name], MD 11/03/2016 8:13 AM

## 2016-11-04 ENCOUNTER — Inpatient Hospital Stay (HOSPITAL_COMMUNITY): Payer: Medicare HMO

## 2016-11-04 DIAGNOSIS — Z953 Presence of xenogenic heart valve: Secondary | ICD-10-CM

## 2016-11-04 LAB — CBC
HEMATOCRIT: 27.8 % — AB (ref 39.0–52.0)
Hemoglobin: 8.9 g/dL — ABNORMAL LOW (ref 13.0–17.0)
MCH: 31.2 pg (ref 26.0–34.0)
MCHC: 32 g/dL (ref 30.0–36.0)
MCV: 97.5 fL (ref 78.0–100.0)
Platelets: 285 10*3/uL (ref 150–400)
RBC: 2.85 MIL/uL — AB (ref 4.22–5.81)
RDW: 15.3 % (ref 11.5–15.5)
WBC: 8.8 10*3/uL (ref 4.0–10.5)

## 2016-11-04 LAB — PROTIME-INR
INR: 1.82
Prothrombin Time: 21.3 seconds — ABNORMAL HIGH (ref 11.4–15.2)

## 2016-11-04 LAB — BASIC METABOLIC PANEL
ANION GAP: 9 (ref 5–15)
BUN: 33 mg/dL — AB (ref 6–20)
CHLORIDE: 100 mmol/L — AB (ref 101–111)
CO2: 26 mmol/L (ref 22–32)
Calcium: 8.1 mg/dL — ABNORMAL LOW (ref 8.9–10.3)
Creatinine, Ser: 1.44 mg/dL — ABNORMAL HIGH (ref 0.61–1.24)
GFR calc non Af Amer: 50 mL/min — ABNORMAL LOW (ref >60)
GFR, EST AFRICAN AMERICAN: 57 mL/min — AB (ref >60)
Glucose, Bld: 105 mg/dL — ABNORMAL HIGH (ref 65–99)
POTASSIUM: 4 mmol/L (ref 3.5–5.1)
SODIUM: 135 mmol/L (ref 135–145)

## 2016-11-04 LAB — COOXEMETRY PANEL
Carboxyhemoglobin: 1.2 % (ref 0.5–1.5)
METHEMOGLOBIN: 1.2 % (ref 0.0–1.5)
O2 SAT: 63.5 %
Total hemoglobin: 9.3 g/dL — ABNORMAL LOW (ref 12.0–16.0)

## 2016-11-04 MED ORDER — WARFARIN SODIUM 5 MG PO TABS: 5.0000 mg | ORAL_TABLET | Freq: Every day | ORAL | 3 refills | Status: DC

## 2016-11-04 MED ORDER — ACETAMINOPHEN 325 MG PO TABS: 325.0000 mg | ORAL_TABLET | ORAL | Status: DC | PRN

## 2016-11-04 MED ORDER — AMIODARONE HCL 200 MG PO TABS: 200.0000 mg | ORAL_TABLET | Freq: Two times a day (BID) | ORAL | 1 refills | Status: DC

## 2016-11-04 MED ORDER — POTASSIUM CHLORIDE CRYS ER 20 MEQ PO TBCR: 20.0000 meq | EXTENDED_RELEASE_TABLET | Freq: Every day | ORAL | 3 refills | Status: DC

## 2016-11-04 MED ORDER — OXYCODONE HCL 5 MG PO TABS: 5.0000 mg | ORAL_TABLET | ORAL | 0 refills | Status: DC | PRN

## 2016-11-04 NOTE — Progress Notes (Addendum)
      [address]       [place] [address]             [phone]      1 Day Post-Op Procedure(s) (LRB): BiV Pacemaker Insertion CRT-P (N/A)   Subjective:  Patient states doing fine.  Ready to go home.  Pacemaker being interrogated this morning during evaluation.  Objective: Vital signs in last 24 hours: Temp:  [98.3 F (36.8 C)-99 F (37.2 C)] 99 F (37.2 C) (03/10 0500) Pulse Rate:  [0-189] 69 (03/10 0600) Cardiac Rhythm: Atrial flutter;Bundle branch block (03/10 0701) Resp:  [0-46] 16 (03/10 0500) BP: (93-170)/(52-84) 121/54 (03/10 0600) SpO2:  [0 %-100 %] 100 % (03/10 0500) Weight:  [150 lb 4.8 oz (68.2 kg)] 150 lb 4.8 oz (68.2 kg) (03/10 0500)  Intake/Output from previous day: 03/09 0701 - 03/10 0700 In: 1214.2 [P.O.:560; I.V.:604.2; IV Piggyback:50] Out: 1316 [Urine:1316]  General appearance: alert, cooperative and no distress Heart: regular rate and rhythm and paced Lungs: clear to auscultation bilaterally Abdomen: soft, non-tender; bowel sounds normal; no masses,  no organomegaly Extremities: edema trace Wound: clean and dry  Lab Results:  Recent Labs  11/04/16 0444  WBC 8.8  HGB 8.9*  HCT 27.8*  PLT 285   BMET:  Recent Labs  11/04/16 0444  NA 135  K 4.0  CL 100*  CO2 26  GLUCOSE 105*  BUN 33*  CREATININE 1.44*  CALCIUM 8.1*    PT/INR:  Recent Labs  11/04/16 0444  LABPROT 21.3*  INR 1.82   ABG    Component Value Date/Time   PHART 7.389 10/27/2016 0302   HCO3 23.2 10/27/2016 0302   TCO2 22 10/26/2016 1650   ACIDBASEDEF 1.1 10/27/2016 0302   O2SAT 63.5 11/04/2016 0510   CBG (last 3)  No results for input(s): GLUCAP in the last 72 hours.  Assessment/Plan: S/P Procedure(s) (LRB): BiV Pacemaker Insertion CRT-P (N/A)  1. CV- S/p PPM yesterday, BP controlled- continue Amiodarone, Coreg..... D/C EPW today 2. INR 1.82, will d/c home on 5 mg coumadin daily 3. Pulm- no acute issues, continue IS 4. Renal-  creatinine trending down at 1.44, weight is relatively stable, will continue lasix 5. Dispo- patient looks good, will remove EPW today, decrease coumadin to 5 mg daily, home later today if no complications from EPW removal   LOS: 10 days    BARRETT, ERIN 11/04/2016  I have seen and examined the patient and agree with the assessment and plan as outlined.  Looks good although it appears that he's still in Aflutter, VVI pacing at 70 beats/min.  D/C home on amiodarone 200 mg bid.  Decrease coumadin to 5 mg/day.   [name], MD 11/04/2016 10:22 AM

## 2016-11-04 NOTE — Progress Notes (Signed)
Patient education completed fro removal of pacer wires, patient verbalised understanding, 4 pacer wires removed plus chest tube sutures, well tolertared , patient now on bed rest will continue to monitor.

## 2016-11-04 NOTE — Progress Notes (Signed)
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 CHF (congestive heart failure) ([place])    a. 04/2016: echo with EF of 50-55%, previously reported 30-35% by outside records.  . Chronic combined systolic and diastolic congestive heart failure ([place])   . CKD (chronic kidney disease) stage 3, GFR 30-59 ml/min   . H/O ETOH abuse   . Hypertension   . Mitral regurgitation    a. severe by echo in 04/2016  . Prostate cancer ([place])   . 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 amiodarone  200 mg Oral BID PC  . aspirin EC  81 mg Oral Daily  . bisacodyl  10 mg Oral Daily   Or  . bisacodyl  10 mg Rectal Daily  . carvedilol  6.25 mg Oral BID WC  .  ceFAZolin (ANCEF) IV  1 g Intravenous Q8H  . colchicine  0.6 mg Oral Daily  . docusate sodium  200 mg Oral Daily  . folic acid  1 mg Oral Daily  . furosemide  40 mg Oral Daily  . iron polysaccharides  150 mg Oral Daily  . pantoprazole  40 mg Oral Daily  . patient's guide to using coumadin book   Does not apply Once  . potassium chloride  20 mEq Oral Daily  . sodium chloride flush  10-40 mL Intracatheter Q12H  . thiamine  100 mg Oral Daily  . warfarin  7.5 mg Oral q1800  . warfarin   Does not apply Once  . Warfarin - Physician Dosing Inpatient   Does not apply q1800   Continuous Infusions: . sodium chloride 250 mL (11/03/16 2233)  . amiodarone 30 mg/hr (11/04/16 1093)   acetaminophen, ipratropium-albuterol, metoprolol, morphine injection, ondansetron (ZOFRAN) IV, oxyCODONE, sodium chloride flush, traMADol    PHYSICAL EXAM Vitals:   11/04/16 0300 11/04/16 0400 11/04/16  0500 11/04/16 0600  BP: 112/64 126/60 135/63 (!) 121/54  Pulse: 70 69 69 69  Resp:   16   Temp:   99 F (37.2 C)   TempSrc:   Oral   SpO2:   100%   Weight:   150 lb 4.8 oz (68.2 kg)   Height:       Well developed and nourished in no acute distress HENT normal Neck supple   Clear Device pocket well healed; without hematoma or erythema.  There is no tethering  Regular rate and rhythm, no murmurs or gallops Abd-soft with active BS No Clubbing cyanosis edema Skin-warm and dry A & Oriented  Grossly normal sensory and motor function  TELEMETRY: Reviewed personnally pt in aflbut with V pacing   ECG personally reviewed Aflut v pacing   Intake/Output Summary (Last 24 hours) at 11/04/16 0749 Last data filed at 11/04/16 0519  Gross per 24 hour  Intake          1214.17 ml  Output             1316 ml  Net          -101.83 ml    LABS: Basic Metabolic Panel:  Recent Labs Lab 10/29/16 0345  10/30/16 0420 11/01/16 0351 11/04/16 0444  NA 132* 134* 134* 135  K 3.6 3.7 4.3 4.0  CL 96* 100* 98* 100*  CO2 27 27 27 26   GLUCOSE 130* 99 134* 105*  BUN 43* 35* 28* 33*  CREATININE 2.37* 1.85* 1.60* 1.44*  CALCIUM 8.2* 8.2* 8.4* 8.1*   Cardiac Enzymes: No results for input(s): CKTOTAL, CKMB, CKMBINDEX, TROPONINI in the last 72 hours. CBC:  Recent Labs Lab 10/29/16 0345 10/30/16 0420 11/01/16 0351 11/04/16 0444  WBC 6.4 7.2 6.6 8.8  HGB 8.8* 8.4* 8.5* 8.9*  HCT 26.8* 25.0* 25.4* 27.8*  MCV 93.4 95.1 94.8 97.5  PLT 74* 84* 142* 285   PROTIME:  Recent Labs  11/02/16 0415 11/03/16 0420 11/04/16 0444  LABPROT 18.6* 19.8* 21.3*  INR 1.53 1.65 1.82   Liver Function Tests: No results for input(s): AST, ALT, ALKPHOS, BILITOT, PROT, ALBUMIN in the last 72 hours. No results for input(s): LIPASE, AMYLASE in the last 72 hours. BNP: BNP (last 3 results)  Recent Labs  04/26/16 1130  BNP 2,761.6*    ProBNP (last 3 results) No results for input(s): PROBNP in the last  8760 hours.  D-Dimer: No results for input(s): DDIMER in the last 72 hours. Hemoglobin A1C: No results for input(s): HGBA1C in the last 72 hours. Fasting Lipid Panel: No results for input(s): CHOL, HDL, LDLCALC, TRIG, CHOLHDL, LDLDIRECT in the last 72 hours. Thyroid Function Tests: No results for input(s): TSH, T4TOTAL, T3FREE, THYROIDAB in the last 72 hours.  Invalid input(s): FREET3 Anemia Panel: No results for input(s): VITAMINB12, FOLATE, FERRITIN, TIBC, IRON, RETICCTPCT in the last 72 hours.   Device Interrogation:* normal device function  Chest Xray personally reviewed  Stable lead position wtihout pneumothorax  ASSESSMENT AND PLAN:  Principal Problem:   S/P minimally invasive mitral valve replacement with bioprosthetic valve and tricuspid valve repair Active Problems:   Severe mitral insufficiency   Tricuspid valve insufficiency   Anemia of chronic disease   CKD (chronic kidney disease) stage 3, GFR 30-59 ml/min   Severe mitral regurgitation   Chronic combined systolic and diastolic congestive heart failure (HCC)   S/P minimally invasive tricuspid valve repair  Device function normal Continue amio Anticoagulated with warfarin and ASA Would anticipate DCCV in about 3-4 weeks  He will be living with his sister in [place] for a number of weeks so can have F/U with DC in AFib clinic in 3 weeks pre DCCV  Signed, Virl Axe MD  11/04/2016

## 2016-11-04 NOTE — Progress Notes (Addendum)
CARDIAC REHAB PHASE I   PRE:  Rate/Rhythm: 70 paced  BP:   Sitting: 137/82     SaO2: 91% RA  MODE:  Ambulation: 440 ft   POST:  Rate/Rhythm: 76 paced  BP:   Sitting: 161/114   180/97     SaO2: 94% RA 1120-1211  Pt ambulated 48ft with walker, IV pole and one person assist. Pt SpO2 remained at 90% with ambulation. Returned pt to beside in which BP was elevated. BP taken at ankle L 161/114   R 180/97.Pt given Living with Heart Failure folder. Discussed sternal precautions, wound care, signs of an infection, daily weight tracking, HH diet, low sodium diet, exercise guidelines and emergency precautions. Pt verbalize understanding. Pt not very interested in cardiac rehab. Referral sent to [facility] cardiac rehab. Recheck BP R ankle 175/103 notified RN of elevated BP post ambulation.  Shalina Norfolk D Doreene Forrey,MS,ACSM-RCEP 11/04/2016 11:53 AM   \

## 2016-11-04 NOTE — Progress Notes (Addendum)
Patient in a stable condition, discharge instructions reviewed with patient, he verbalized understanding, paper prescriptions given to patient, patient belongings at bedside.PICC dc , tele dc ccmd notified, patient taken off the floor on a wheelchair by a NT, to be driven home by his nephew

## 2016-11-06 ENCOUNTER — Ambulatory Visit (INDEPENDENT_AMBULATORY_CARE_PROVIDER_SITE_OTHER): Payer: Medicare HMO | Admitting: Pharmacist Clinician (PhC)/ Clinical Pharmacy Specialist

## 2016-11-06 ENCOUNTER — Encounter (HOSPITAL_COMMUNITY): Payer: Self-pay | Admitting: Internal Medicine

## 2016-11-06 ENCOUNTER — Telehealth (HOSPITAL_COMMUNITY): Payer: Self-pay

## 2016-11-06 DIAGNOSIS — Z7901 Long term (current) use of anticoagulants: Secondary | ICD-10-CM

## 2016-11-06 DIAGNOSIS — Z9889 Other specified postprocedural states: Secondary | ICD-10-CM | POA: Diagnosis not present

## 2016-11-06 DIAGNOSIS — Z5181 Encounter for therapeutic drug level monitoring: Secondary | ICD-10-CM | POA: Diagnosis not present

## 2016-11-06 DIAGNOSIS — Z953 Presence of xenogenic heart valve: Secondary | ICD-10-CM | POA: Diagnosis not present

## 2016-11-06 LAB — POCT INR: INR: 2.2

## 2016-11-06 NOTE — Telephone Encounter (Signed)
Verified [employer] Medicare & Medicaid insurance benefits through Passport. No Co-Pay, Co-Insurance 20%, No Deductible Out of Pocket $6700.00, pt has met $1026.60, pt's responsibility [phone]. Reference [phone] & [phone]... KJ

## 2016-11-07 ENCOUNTER — Other Ambulatory Visit: Payer: Self-pay | Admitting: Student

## 2016-11-07 NOTE — Telephone Encounter (Signed)
Rx(s) sent to pharmacy electronically.  

## 2016-11-10 ENCOUNTER — Ambulatory Visit (INDEPENDENT_AMBULATORY_CARE_PROVIDER_SITE_OTHER): Payer: Medicare HMO | Admitting: Pharmacist

## 2016-11-10 ENCOUNTER — Other Ambulatory Visit: Payer: Self-pay | Admitting: Thoracic Surgery (Cardiothoracic Vascular Surgery)

## 2016-11-10 DIAGNOSIS — Z9889 Other specified postprocedural states: Secondary | ICD-10-CM | POA: Diagnosis not present

## 2016-11-10 DIAGNOSIS — Z7901 Long term (current) use of anticoagulants: Secondary | ICD-10-CM

## 2016-11-10 DIAGNOSIS — Z5181 Encounter for therapeutic drug level monitoring: Secondary | ICD-10-CM

## 2016-11-10 DIAGNOSIS — Z953 Presence of xenogenic heart valve: Secondary | ICD-10-CM | POA: Diagnosis not present

## 2016-11-10 LAB — POCT INR: INR: 3.2

## 2016-11-13 ENCOUNTER — Ambulatory Visit: Payer: Self-pay

## 2016-11-13 NOTE — Progress Notes (Unsigned)
This encounter was created in error - please disregard.

## 2016-11-13 NOTE — Progress Notes (Unsigned)
Patient was a no show on this date

## 2016-11-14 ENCOUNTER — Ambulatory Visit (INDEPENDENT_AMBULATORY_CARE_PROVIDER_SITE_OTHER): Payer: Medicare HMO | Admitting: Physician Assistant

## 2016-11-14 ENCOUNTER — Ambulatory Visit (INDEPENDENT_AMBULATORY_CARE_PROVIDER_SITE_OTHER): Payer: Medicare HMO | Admitting: *Deleted

## 2016-11-14 ENCOUNTER — Encounter: Payer: Self-pay | Admitting: Physician Assistant

## 2016-11-14 ENCOUNTER — Ambulatory Visit (INDEPENDENT_AMBULATORY_CARE_PROVIDER_SITE_OTHER): Payer: Medicare HMO | Admitting: Pharmacist Clinician (PhC)/ Clinical Pharmacy Specialist

## 2016-11-14 VITALS — BP 97/61 | HR 70 | Ht 71.0 in | Wt 148.0 lb

## 2016-11-14 DIAGNOSIS — Z7901 Long term (current) use of anticoagulants: Secondary | ICD-10-CM | POA: Diagnosis not present

## 2016-11-14 DIAGNOSIS — I5032 Chronic diastolic (congestive) heart failure: Secondary | ICD-10-CM | POA: Diagnosis not present

## 2016-11-14 DIAGNOSIS — Z953 Presence of xenogenic heart valve: Secondary | ICD-10-CM

## 2016-11-14 DIAGNOSIS — I484 Atypical atrial flutter: Secondary | ICD-10-CM | POA: Diagnosis not present

## 2016-11-14 DIAGNOSIS — Z950 Presence of cardiac pacemaker: Secondary | ICD-10-CM

## 2016-11-14 DIAGNOSIS — R0609 Other forms of dyspnea: Secondary | ICD-10-CM | POA: Diagnosis not present

## 2016-11-14 DIAGNOSIS — I442 Atrioventricular block, complete: Secondary | ICD-10-CM

## 2016-11-14 DIAGNOSIS — Z5181 Encounter for therapeutic drug level monitoring: Secondary | ICD-10-CM | POA: Diagnosis not present

## 2016-11-14 DIAGNOSIS — Z9889 Other specified postprocedural states: Secondary | ICD-10-CM

## 2016-11-14 LAB — CUP PACEART INCLINIC DEVICE CHECK
Battery Voltage: 3.13 V
Brady Statistic RA Percent Paced: 0 %
Date Time Interrogation Session: 20180320115305
Implantable Lead Implant Date: 20180309
Implantable Lead Location: 753859
Implantable Lead Location: 753860
Lead Channel Impedance Value: 450 Ohm
Lead Channel Pacing Threshold Amplitude: 0.5 V
Lead Channel Pacing Threshold Pulse Width: 1 ms
Lead Channel Sensing Intrinsic Amplitude: 9 mV
Lead Channel Setting Pacing Amplitude: 3.5 V
Lead Channel Setting Pacing Pulse Width: 0.05 ms
MDC IDC LEAD IMPLANT DT: 20180309
MDC IDC LEAD IMPLANT DT: 20180309
MDC IDC LEAD LOCATION: 753860
MDC IDC MSMT LEADCHNL LV PACING THRESHOLD AMPLITUDE: 0.75 V
MDC IDC MSMT LEADCHNL RA IMPEDANCE VALUE: 437.5 Ohm
MDC IDC MSMT LEADCHNL RA PACING THRESHOLD PULSEWIDTH: 0.5 ms
MDC IDC MSMT LEADCHNL RA SENSING INTR AMPL: 4.3 mV
MDC IDC MSMT LEADCHNL RV IMPEDANCE VALUE: 600 Ohm
MDC IDC MSMT LEADCHNL RV PACING THRESHOLD AMPLITUDE: 0.5 V
MDC IDC MSMT LEADCHNL RV PACING THRESHOLD PULSEWIDTH: 0.5 ms
MDC IDC PG IMPLANT DT: 20180309
MDC IDC SET LEADCHNL LV PACING PULSEWIDTH: 1 ms
MDC IDC SET LEADCHNL RA PACING AMPLITUDE: 3.5 V
MDC IDC SET LEADCHNL RV PACING AMPLITUDE: 0.25 V
MDC IDC SET LEADCHNL RV SENSING SENSITIVITY: 2 mV
MDC IDC STAT BRADY RV PERCENT PACED: 95 %
Pulse Gen Model: 3222
Pulse Gen Serial Number: 8006947

## 2016-11-14 LAB — POCT INR: INR: 3.6

## 2016-11-14 NOTE — Patient Instructions (Signed)
Medication Instructions:  Your physician recommends that you continue on your current medications as directed. Please refer to the Current Medication list given to you today.   Labwork: NONE ORDERED  Testing/Procedures: NONE ORDERED   Follow-Up: DR. Doug Sou FIRST AVAILABLE; IN 1 MONTH  Any Other Special Instructions Will Be Listed Below (If Applicable).  YOUR HAVE BEEN ADVISED TO WALK SOME EVERYDAY; AT LEAST 5 MINUTES DAILY WITH SLOWLY INCREASING; YOU CAN WALK OUTSIDE WHEN THE TEMPETURE IS BETWEEN 50-80 DEGREES OUTSIDE   If you need a refill on your cardiac medications before your next appointment, please call your pharmacy.

## 2016-11-14 NOTE — Progress Notes (Signed)
Wound check appointment, His lead in LV port. Steri-strips removed. Wound without redness or edema. Incision edges approximated, wound well healed. Normal device function. Thresholds, sensing, and impedances consistent with implant measurements. Non-selective His capture confirmed with rhythm strip, reviewed by GT. Device programmed at 3.5V (RA and LV/His), RV output remains programmed subthreshold per GT. Histogram distribution appropriate for patient and level of activity. 100% AT/AF burden, +warfarin. ANIPS performed by GT, successfully converted to SR w/first degree HB. No high ventricular rates noted. Base rate increased to 70bpm. Patient educated about wound care, arm mobility, lifting restrictions. AF clinic f/u appointment canceled per GT. ROV with GT on 02/02/17.

## 2016-11-14 NOTE — Progress Notes (Signed)
Cardiology Office Note   Date:  11/14/2016   ID:  Aaron Bennett, DOB 10-Feb-1951, MRN [id number]  PCP:  Pcp Not In System  Cardiologist:  Dr. Martinique saw at cath 09/06/2016 Aaron Bennett 08/30/2016  Chief Complaint  Patient presents with  . Follow-up    Pt states no Sx.     History of Present Illness: Aaron Bennett is a 66 y.o. male with a history of HTN, HLD, prostate CA, CKD stage III, and h/oalcohol abuse. EF 30-35% at echo during admit for CHF to [facility] 72/[id number], complicated by ETOH withdrawal and sz.   Admit 02/28-03/10 for minimally invasive mitral valve replacement with bioprosthetic valve and tricuspid valve repair. He had post-op CHB and recurrent atrial flutter s/p St. Jude biventricular pacemaker   Aaron Bennett presents for ongoing follow up.   He never feels his heart skip or race. No palpitations, not light-headed or dizzy. He has not had any bleeding issues.   He is still very SOB with minimal exertion. He sometimes gets SOB walking from one end of the house to the other. No LE edema, no PND or orthopnea. He is on 2 pillows, feels he could go back to one.   He lives with his sister. He eats 3 meals a day, feels like that is going well. He is not drinking alcohol.   His only issue or concern is the fact that he has not really felt like increasing his activity.   Past Medical History:  Diagnosis Date  . CHF (congestive heart failure) ([place])    a. 04/2016: echo with EF of 50-55%, previously reported 30-35% by outside records.  . Chronic combined systolic and diastolic congestive heart failure ([place])   . CKD (chronic kidney disease) stage 3, GFR 30-59 ml/min   . H/O ETOH abuse   . Hypertension   . Mitral regurgitation    a. severe by echo in 04/2016  . Prostate cancer ([place])   . 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 APPENDECTOMY    . BIV PACEMAKER INSERTION CRT-P N/A 11/03/2016   Procedure: BiV Pacemaker Insertion CRT-P;  Surgeon: Evans Lance, MD;  Location: [place] CV LAB;  Service: Cardiovascular;  Laterality: N/A; St Jude  . CARDIAC CATHETERIZATION N/A 09/06/2016   Procedure: Right/Left Heart Cath and Coronary Angiography;  Surgeon: Peter M Martinique, MD;  Location: [place] CV LAB;  Service: Cardiovascular;  Laterality: N/A;  . MINIMALLY INVASIVE TRICUSPID VALVE REPAIR Right 10/25/2016   Procedure: MINIMALLY INVASIVE TRICUSPID VALVE REPAIR;  Surgeon: [name], MD;  Location: [place];  Service: Open Heart Surgery;  Laterality: Right;  . MITRAL VALVE REPLACEMENT Right 10/25/2016   Procedure: MINIMALLY INVASIVE MITRAL VALVE (MV) REPLACEMENT;  Surgeon: [name], MD;  Location: [place];  Service: Open Heart Surgery;  Laterality: Right;  . PROSTATE SURGERY    . TEE WITHOUT CARDIOVERSION N/A 09/06/2016   Procedure: TRANSESOPHAGEAL ECHOCARDIOGRAM (TEE);  Surgeon: Thayer Headings, MD;  Location: [place];  Service: Cardiovascular;  Laterality: N/A;  . TEE WITHOUT CARDIOVERSION N/A 10/25/2016   Procedure: TRANSESOPHAGEAL ECHOCARDIOGRAM (TEE);  Surgeon: [name], MD;  Location: [place];  Service: Open Heart Surgery;  Laterality: N/A;    Current Outpatient Prescriptions  Medication Sig Dispense Refill  . amiodarone (PACERONE)  200 MG tablet Take 1 tablet (200 mg total) by mouth 2 (two) times daily after a meal. 60 tablet 1  . carvedilol (COREG) 6.25 MG tablet Take 1 tablet (6.25 mg total) by mouth 2 (two) times daily with a meal. 60 tablet 6  . folic acid (FOLVITE) 1 MG tablet Take 1 tablet (1 mg total) by mouth daily. 30 tablet 11  . furosemide (LASIX) 40 MG tablet TAKE 1 TABLET BY MOUTH DAILY 30 tablet 6  . Multiple Vitamin (MULTIVITAMIN) tablet Take 1 tablet by mouth daily.    Marland Kitchen oxyCODONE (OXY  IR/ROXICODONE) 5 MG immediate release tablet Take 1-2 tablets (5-10 mg total) by mouth every 4 (four) hours as needed for severe pain. 40 tablet 0  . potassium chloride SA (K-DUR,KLOR-CON) 20 MEQ tablet Take 1 tablet (20 mEq total) by mouth daily. 30 tablet 3  . thiamine (VITAMIN B-1) 100 MG tablet Take 1 tablet (100 mg total) by mouth daily. 30 tablet 11  . warfarin (COUMADIN) 5 MG tablet Take 1 tablet (5 mg total) by mouth daily at 6 PM. 30 tablet 3   No current facility-administered medications for this visit.     Allergies:   No known allergies    Social History:  The patient  reports that he quit smoking about 6 months ago. He has a 50.00 pack-year smoking history. He has never used smokeless tobacco. He reports that he does not drink alcohol or use drugs.   Family History:  The patient's family history includes Lung disease in his mother; Stroke (age of onset: 43) in his father; Yves Dill Parkinson White syndrome in his brother.    ROS:  Please see the history of present illness. All other systems are reviewed and negative.    PHYSICAL EXAM: VS:  BP 97/61 (BP Location: Left Arm, Patient Position: Sitting, Cuff Size: Normal)   Pulse 70   Ht 5\' 11"  (1.803 m)   Wt 148 lb (67.1 kg)   BMI 20.64 kg/m  , BMI Body mass index is 20.64 kg/m. GEN: Well nourished, well developed, male in no acute distress  HEENT: normal for age  Neck: Minimal JVD, no carotid bruit, no masses Cardiac: RRR; soft diastolic murmur, no rubs, or gallops Respiratory:  Decreased breath sounds bases, right greater than left, normal work of breathing GI: soft, nontender, nondistended, + BS MS: no deformity or atrophy; no edema; distal pulses are 2+ in all 4 extremities   Skin: warm and dry, no rash; incisions are generally healing well, one is slightly open with a small amount of serosanguineous drainage. Neuro:  Strength and sensation are intact Psych: euthymic mood, full affect   EKG:  EKG is ordered today. The  ekg ordered today demonstrates atrial flutter with ventricular pacing  ECHO: 10/30/2016 - Left ventricle: Diffuse hypokinesis abnormal septal motion   Systolic function was mildly to moderately reduced. The estimated   ejection fraction was in the range of 40% to 45%. Diffuse   hypokinesis. The study is not technically sufficient to allow   evaluation of LV diastolic function. - Aortic valve: There was mild regurgitation. - Mitral valve: Normal appearing bioprosthetic MVR. No peri   valvular regurgitations  TEE: 10/25/2016 ventricle: Systolic function was moderately reduced. The   estimated ejection fraction was in the range of 35% to 40%.   Diffuse hypokinesis, more profound in the inferior segment. - Aortic valve: Mildly dilated, noncalcified annulus. Trileaflet;   normal thickness, noncalcified leaflets. There was mild to  moderate regurgitation originating from the central coaptation   point and directed towards the mitral anterior leaflet. - Mitral valve: Leaflet separation was severely reduced. Mobility   of both the anterior and posterior leaflets were severely   restricted, with very thickened chordae tendinae. No   echocardiographic evidence for prolapse. Transvalvular velocity   was minimally increased. The findings are consistent with mild   stenosis. There was severe mitral regurgitation originating from   the central commissure. Severe regurgitation is suggested by both   pulmonary vein systolic flow reversal and a vena contracta width>= 6 mm. - Left atrium: The atrium was dilated. - Left lower pulmonary vein: There was systolic flow reversal. - Right atrium: The atrium was severely dilated. - Atrial septum: The septum bowed from left to right, consistent   with increased left atrial pressure. The interatrial septum was   hypermobile. No defect or patent foramen ovale was identified. - Staged echo: Limited Post CPB study: On separation from CPB the   LV is extremely  hypokinetic. This improved with inotrope infusion   and time off of CPB, with specific improvement seen in the   anterolateral myocardium. The overall EF remains approximately   10-15%. No change post bypass in aortic valve function. Mild AI   persists. The prosthetic mitral valve is well seated in the   Mitral annulus. The leaflets function well. The mitral annular   ring is in good position. Post mitral valve replacement images   demonstrate very mild central MR, no stenosis, and no   perivalvular leak. The gradients across the artificial mitral   valve are peak 4 mmHg, and mean 2 mmHg. The tricuspid   regurgitation seen previously is now only trivial. Impressions - Post mitral valve replacement surgery, the prosthetic valve   appears to be functioning normally. There is very mild central   mitral regurgitation. Excellent prosthetic mitral valve function   without evidence for perivalvular leak. Other valves unchanged.   The mitral annular ring is in good position. Left ventricular   function is extremely hypokinetic s/p CPB.  Right/left heart cath: 09/06/2016  Mid RCA lesion, 20 %stenosed.  Hemodynamic findings consistent with moderate pulmonary hypertension.  LV end diastolic pressure is normal.  1. No significant CAD 2. Moderate to severe pulmonary HTN 3. Elevated PCWP with prominent V waves c/w severe MR. 4. Reduced cardiac output/index. Plan: consider patient for MVR versus repair.   Recent Labs: 04/26/2016: B Natriuretic Peptide 2,761.6 10/26/2016: Magnesium 1.8 10/27/2016: ALT 10 11/04/2016: BUN 33; Creatinine, Ser 1.44; Hemoglobin 8.9; Platelets 285; Potassium 4.0; Sodium 135    Lipid Panel No results found for: CHOL, TRIG, HDL, CHOLHDL, VLDL, LDLCALC, LDLDIRECT   Wt Readings from Last 3 Encounters:  11/14/16 148 lb (67.1 kg)  11/04/16 150 lb 4.8 oz (68.2 kg)  10/16/16 155 lb (70.3 kg)     Other studies Reviewed: Additional studies/ records that were reviewed  today include: Office notes, hospital records and procedures.  ASSESSMENT AND PLAN:  1.  s/p minimally invasive mitral valve replacement and tricuspid valve repair: He is doing well postop. His incisions in general seem to be healing well. There is a small incision at the lower right breast that is still open. His sister told him that she saw some pus coming out of this. I was not able to express anything except a very small amount of serosanguineous fluid. It does not have obvious signs of infection. He is not having fevers or chills He is to  monitor this and follow-up with [facility] as scheduled.  2. Atrial flutter: He is still in atrial flutter but was asymptomatic. His heart rate increased appropriately with activity up into the 90s. It is hard to tell if the atrial fibrillation is partly responsible for his dyspnea on exertion. He is compliant with his Coumadin, but it has only been therapeutic since 11/06/2016.   He is to follow-up with Dr. Martinique and Dr. Martinique is to decide regarding cardioversion. By then, he should have been therapeutic more than 3 weeks.  3. Dyspnea on exertion: He does not have significant volume overload by exam. A walk test was performed here in the office today. He gets short of breath with exertion, but did not drop his sats below 92%. He was able to go 200 feet, but did not have to stop. He felt some shortness of breath when he got back but seemed to tolerate it well. He is not interested in cardiac rehabilitation, there may be both financial and transportation issues. He is encouraged to increase his activity as he is able. He is encouraged to note the distance that he walks or note the time that he walks so that he can track it and hopefully start to increase this.  4. Nonischemic cardiomyopathy: His EF was 40-45 percent in the hospital. His final status is good. They were not able to determine diastolic dysfunction. Check an echocardiogram in 3 months.   Current  medicines are reviewed at length with the patient today.  The patient does not have concerns regarding medicines.  The following changes have been made:  no change  Labs/ tests ordered today include:  No orders of the defined types were placed in this encounter.    Disposition:   FU with Dr. Martinique  Signed, Rosaria Ferries, PA-C  11/14/2016 8:26 AM    [facility] Phone: [phone]; Fax: [phone]  This note was written with the assistance of speech recognition software. Please excuse any transcriptional errors.

## 2016-11-19 ENCOUNTER — Other Ambulatory Visit: Payer: Self-pay | Admitting: Student

## 2016-11-20 ENCOUNTER — Ambulatory Visit (INDEPENDENT_AMBULATORY_CARE_PROVIDER_SITE_OTHER): Payer: Self-pay | Admitting: Physician Assistant

## 2016-11-20 ENCOUNTER — Ambulatory Visit
Admission: RE | Admit: 2016-11-20 | Discharge: 2016-11-20 | Disposition: A | Discharge status: Home / Self Care | Payer: Medicare HMO | Source: Ambulatory Visit | Attending: Thoracic Surgery (Cardiothoracic Vascular Surgery) | Admitting: Thoracic Surgery (Cardiothoracic Vascular Surgery)

## 2016-11-20 VITALS — BP 112/74 | HR 88 | Resp 20 | Ht 71.0 in | Wt 148.0 lb

## 2016-11-20 DIAGNOSIS — Z952 Presence of prosthetic heart valve: Secondary | ICD-10-CM

## 2016-11-20 DIAGNOSIS — Z9889 Other specified postprocedural states: Secondary | ICD-10-CM

## 2016-11-20 NOTE — Patient Instructions (Addendum)
You may return to driving an automobile as long as you are no longer requiring oral narcotic pain relievers during the daytime.  It would be wise to start driving only short distances during the daylight and gradually increase from there as you feel comfortable.  Endocarditis is a potentially serious infection of heart valves or inside lining of the heart.  It occurs more commonly in patients with diseased heart valves (such as patient's with aortic or mitral valve disease) and in patients who have undergone heart valve repair or replacement.  Certain surgical and dental procedures may put you at risk, such as dental cleaning, other dental procedures, or any surgery involving the respiratory, urinary, gastrointestinal tract, gallbladder or prostate gland.   To minimize your chances for develooping endocarditis, maintain good oral health and seek prompt medical attention for any infections involving the mouth, teeth, gums, skin or urinary tract.    Always notify your doctor or dentist about your underlying heart valve condition before having any invasive procedures. You will need to take antibiotics before certain procedures, including all routine dental cleanings or other dental procedures.  Your cardiologist or dentist should prescribe these antibiotics for you to be taken ahead of time.     Make every effort to stay physically active, get some type of exercise on a regular basis, and stick to a "heart healthy diet".  The long term benefits for regular exercise and a healthy diet are critically important to your overall health and wellbeing.  Continue to limit your weight to 5lbs with your upper body until cardiac rehab. A cardiac rehab referral was made today for [facility].    Follow-up in 2 months

## 2016-11-20 NOTE — Progress Notes (Signed)
Aaron Bennett is a 66 y.o. male patient who presents for his follow-up mitral valve replacement/tricuspid valve repair follow-up appointment.  No diagnosis found. Past Medical History:  Diagnosis Date  . CHF (congestive heart failure) ([place])    a. 04/2016: echo with EF of 50-55%, previously reported 30-35% by outside records.  . Chronic combined systolic and diastolic congestive heart failure ([place])   . CKD (chronic kidney disease) stage 3, GFR 30-59 ml/min   . H/O ETOH abuse   . Hypertension   . Mitral regurgitation    a. severe by echo in 04/2016  . Prostate cancer ([place])   . 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 Current Outpatient Prescriptions on File Prior to Visit  Medication Sig Dispense Refill  . amiodarone (PACERONE) 200 MG tablet Take 1 tablet (200 mg total) by mouth 2 (two) times daily after a meal. 60 tablet 1  . carvedilol (COREG) 6.25 MG tablet Take 1 tablet (6.25 mg total) by mouth 2 (two) times daily with a meal. 60 tablet 6  . folic acid (FOLVITE) 1 MG tablet Take 1 tablet (1 mg total) by mouth daily. 30 tablet 11  . furosemide (LASIX) 40 MG tablet TAKE 1 TABLET BY MOUTH DAILY 30 tablet 6  . Multiple Vitamin (MULTIVITAMIN) tablet Take 1 tablet by mouth daily.    Marland Kitchen oxyCODONE (OXY IR/ROXICODONE) 5 MG immediate release tablet Take 1-2 tablets (5-10 mg total) by mouth every 4 (four) hours as needed for severe pain. 40 tablet 0  . potassium chloride SA (K-DUR,KLOR-CON) 20 MEQ tablet Take 1 tablet (20 mEq total) by mouth daily. 30 tablet 3  . thiamine (VITAMIN B-1) 100 MG tablet Take 1 tablet (100 mg total) by mouth daily. 30 tablet 11  . warfarin (COUMADIN) 5 MG tablet Take 1 tablet (5 mg total) by mouth daily at 6 PM. 30 tablet 3   No current facility-administered  medications on file prior to visit.     Allergies  Allergen Reactions  . No Known Allergies     Blood pressure 112/74, pulse 88, resp. rate 20, height 5\' 11"  (1.803 m), weight 67.1 kg (148 lb), SpO2 95 %.  Subjective: The patient shares that he does have a dry cough. No issues with shortness of breath with ambulation. His pain is well controlled on no medication  Objective: Cor- RRR, no murmur. Pulm- CTA bilaterally in all fields Abd-soft, normal bowel sounds Ext- no edema Wound-C/D/I without drainage  CLINICAL DATA:  Tricuspid valve repair February 2018.  EXAM: CHEST  2 VIEW  COMPARISON:  11/04/2016  FINDINGS: There is mild bilateral perihilar interstitial thickening. There is no focal parenchymal opacity. There is no pleural effusion or pneumothorax. The heart and mediastinal contours are unremarkable. There is a 3 lead cardiac pacemaker. There is a mitral valve and tricuspid valve replacement.  The osseous structures are unremarkable.  IMPRESSION: Mild pulmonary vascular congestion.   Electronically Signed   By: Kathreen Devoid   On: 11/20/2016 13:48  Assessment & Plan:  Aaron Bennett Underwent a mitral valve replacement and tricuspid valve repair with Dr. Roxy Manns. He is here today for his 2 week follow-up appointment. Overall he is doing quite well. He has been walking at home without difficulty and without shortness of breath. He has never taken a pain pill and his pain  has been well controlled. He had been going to the cardiologist for blood draws for his INR. They have been adjusting his Coumadin accordingly. He has an appointment tomorrow with the cardiology practice. He does complain of a dry cough which I explained can happen after surgery. I encouraged him to stay hydrated and to keep his throat lubricated. I stated that over-the-counter lossengers and or cough suppressants can help. He is to contact our office or his primary care office in 1-2 weeks if he  continues to have this cough. His blood pressure and heart rate were both well controlled today. I encouraged him to increase his distance when he walks as tolerated. I gave him a referral today for a cardiac rehabilitation program here at [facility]. He is able to drive when he is asymptomatic. He is no longer requiring pain medicine. I encouraged him to have somebody else in the car his first few times driving to ensure that he is asymptomatic. He has had no other issues and he has no other questions at this time. He is encouraged to reach out to our office if he has any questions. He will follow up in 2 months with Dr. Roxy Manns for another follow-up visit.       Aaron Bennett 11/20/2016

## 2016-11-21 ENCOUNTER — Ambulatory Visit (INDEPENDENT_AMBULATORY_CARE_PROVIDER_SITE_OTHER): Payer: Medicare HMO | Admitting: Pharmacist

## 2016-11-21 DIAGNOSIS — Z9889 Other specified postprocedural states: Secondary | ICD-10-CM

## 2016-11-21 DIAGNOSIS — Z5181 Encounter for therapeutic drug level monitoring: Secondary | ICD-10-CM

## 2016-11-21 DIAGNOSIS — Z953 Presence of xenogenic heart valve: Secondary | ICD-10-CM | POA: Diagnosis not present

## 2016-11-21 DIAGNOSIS — Z7901 Long term (current) use of anticoagulants: Secondary | ICD-10-CM

## 2016-11-21 LAB — POCT INR: INR: 1.8

## 2016-11-28 ENCOUNTER — Ambulatory Visit (HOSPITAL_COMMUNITY): Payer: Medicare HMO | Admitting: Nurse Practitioner

## 2016-11-29 ENCOUNTER — Other Ambulatory Visit: Payer: Self-pay | Admitting: Cardiology

## 2016-11-29 ENCOUNTER — Ambulatory Visit (INDEPENDENT_AMBULATORY_CARE_PROVIDER_SITE_OTHER): Payer: Medicare HMO | Admitting: Pharmacist

## 2016-11-29 DIAGNOSIS — Z953 Presence of xenogenic heart valve: Secondary | ICD-10-CM

## 2016-11-29 DIAGNOSIS — Z7901 Long term (current) use of anticoagulants: Secondary | ICD-10-CM | POA: Diagnosis not present

## 2016-11-29 DIAGNOSIS — Z5181 Encounter for therapeutic drug level monitoring: Secondary | ICD-10-CM

## 2016-11-29 DIAGNOSIS — Z9889 Other specified postprocedural states: Secondary | ICD-10-CM

## 2016-11-29 LAB — POCT INR: INR: 3.9

## 2016-11-29 NOTE — Telephone Encounter (Signed)
Rx(s) sent to pharmacy electronically.  

## 2016-12-11 ENCOUNTER — Ambulatory Visit (INDEPENDENT_AMBULATORY_CARE_PROVIDER_SITE_OTHER): Payer: Medicare HMO | Admitting: Pharmacist

## 2016-12-11 DIAGNOSIS — Z9889 Other specified postprocedural states: Secondary | ICD-10-CM

## 2016-12-11 DIAGNOSIS — Z953 Presence of xenogenic heart valve: Secondary | ICD-10-CM

## 2016-12-11 DIAGNOSIS — Z7901 Long term (current) use of anticoagulants: Secondary | ICD-10-CM

## 2016-12-11 DIAGNOSIS — Z5181 Encounter for therapeutic drug level monitoring: Secondary | ICD-10-CM

## 2016-12-11 LAB — POCT INR: INR: 4.1

## 2016-12-23 NOTE — Progress Notes (Signed)
Cardiology Office Note    Date:  12/26/2016   ID:  Aaron Bennett, DOB 1951-04-25, MRN [id number]  PCP:  No PCP Per Patient  Cardiologist:  Dr. Kimie Pidcock Martinique  Chief Complaint  Patient presents with  . Atrial Flutter  . Congestive Heart Failure    History of Present Illness:  Aaron Bennett is a 66 y.o. male with PMH of HTN, HLD, prostate CA, stage III CKD, and h/o alcohol abuse. He was hospitalized for CHF exacerbation from 8/20-8/23/2017 at [facility]. Echocardiogram obtained during that admission showed reduced ejection fraction of 30-35% with severe MR and TR. 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 He did have alcohol withdrawal with seizure during that admission. He was not prescribed diuretic at the time of discharge due to elevated creatinine. He went to stay with his nephew in [place] area after discharge. Patient became short of breath and came to the emergency room at [facility] on 04/26/2016 with congestive heart failure. He was admitted and diuresed. A repeat echo obtained on 04/27/2016 showed improved ejection fraction of 50-55% with severe MR. After a period of time due to social factors he underwent TEE in January 2018 showing severe MR, moderate TR, RV dysfunction. LV EF 50-55%. Right and left heart cath performed showing no significant CAD. Large V waves c/w MR. Moderate to severe pulmonary HTN. On 10/31/16 he underwent minimally invasive MVR with a #31 Edwards Magna Bovine prosthesis and tricuspid valve repair with annuloplasty by Dr. Roxy Manns. Intraoperative TEE showed EF 35% which worsened acutely coming off bypass. He was treated with inotropes. Post op he developed CHB as well as atrial flutter. He had a BiV pacemaker placed by EP. He was also treated for volume overload and PNA. Repeat Echo post op showed EF 40-45% with good prosthetic valve function. He was anticoagulated with coumadin.  Seen in follow up on March 20th.  He was still in atrial flutter with controlled rate. Complained of some dyspnea but sats good when walked in office and did not appear volume overloaded.   On follow up today he states he feels very well. No chest pain or SOB. No edema. States he is walking some every day. Slight soreness at surgery site. No dizziness or syncope. Denies any palpitations. No bleeding on coumadin.        Past Medical History:  Diagnosis Date  . CHF (congestive heart failure) ([place])    a. 04/2016: echo with EF of 50-55%, previously reported 30-35% by outside records.  . Chronic combined systolic and diastolic congestive heart failure ([name])   . CKD (chronic kidney disease) stage 3, GFR 30-59 ml/min   . H/O ETOH abuse   . Hypertension   . Mitral regurgitation    a. severe by echo in 04/2016  . Prostate cancer ([place])   . 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 APPENDECTOMY    . BIV PACEMAKER INSERTION CRT-P N/A 11/03/2016   Procedure: BiV Pacemaker Insertion CRT-P;  Surgeon: Evans Lance, MD;  Location: [place] [facility];  Service: Cardiovascular;  Laterality: N/A; [employer]  . CARDIAC CATHETERIZATION N/A 09/06/2016   Procedure: Right/Left Heart Cath and Coronary Angiography;  Surgeon: Cathyann Kilfoyle M Martinique, MD;  Location: [place] [facility];  Service: Cardiovascular;  Laterality: N/A;  .  MINIMALLY INVASIVE TRICUSPID VALVE REPAIR Right 10/25/2016   Procedure: MINIMALLY INVASIVE TRICUSPID VALVE REPAIR;  Surgeon: [name], MD;  Location: [place];  Service: Open Heart Surgery;  Laterality: Right;  . MITRAL VALVE REPLACEMENT Right 10/25/2016   Procedure: MINIMALLY INVASIVE MITRAL VALVE (MV) REPLACEMENT;  Surgeon: [name], MD;  Location: [place];  Service: Open Heart Surgery;  Laterality: Right;  . PROSTATE  SURGERY    . TEE WITHOUT CARDIOVERSION N/A 09/06/2016   Procedure: TRANSESOPHAGEAL ECHOCARDIOGRAM (TEE);  Surgeon: Thayer Headings, MD;  Location: [place];  Service: Cardiovascular;  Laterality: N/A;  . TEE WITHOUT CARDIOVERSION N/A 10/25/2016   Procedure: TRANSESOPHAGEAL ECHOCARDIOGRAM (TEE);  Surgeon: [name], MD;  Location: [place];  Service: Open Heart Surgery;  Laterality: N/A;    Current Medications: Outpatient Medications Prior to Visit  Medication Sig Dispense Refill  . amiodarone (PACERONE) 200 MG tablet Take 1 tablet (200 mg total) by mouth 2 (two) times daily after a meal. 60 tablet 1  . carvedilol (COREG) 6.25 MG tablet TAKE 1 TABLET (6.25 MG TOTAL) BY MOUTH 2 (TWO) TIMES DAILY WITH A MEAL. 60 tablet 8  . folic acid (FOLVITE) 1 MG tablet Take 1 tablet (1 mg total) by mouth daily. 30 tablet 11  . furosemide (LASIX) 40 MG tablet TAKE 1 TABLET BY MOUTH DAILY 30 tablet 6  . Multiple Vitamin (MULTIVITAMIN) tablet Take 1 tablet by mouth daily.    . potassium chloride SA (K-DUR,KLOR-CON) 20 MEQ tablet Take 1 tablet (20 mEq total) by mouth daily. 30 tablet 3  . thiamine (VITAMIN B-1) 100 MG tablet Take 1 tablet (100 mg total) by mouth daily. 30 tablet 11  . warfarin (COUMADIN) 5 MG tablet Take 1 tablet (5 mg total) by mouth daily at 6 PM. 30 tablet 3  . oxyCODONE (OXY IR/ROXICODONE) 5 MG immediate release tablet Take 1-2 tablets (5-10 mg total) by mouth every 4 (four) hours as needed for severe pain. (Patient not taking: Reported on 12/26/2016) 40 tablet 0   No facility-administered medications prior to visit.      Allergies:   No known allergies   Social History   Social History  . Marital status: Single    Spouse name: N/A  . Number of children: N/A  . Years of education: N/A   Social History Main Topics  . Smoking status: Former Smoker    Packs/day: 1.00    Years: 50.00    Quit date: 04/16/2016  . Smokeless tobacco: Never Used  . Alcohol use No     Comment: no  alcohol since 03/2016  . Drug use: No  . Sexual activity: Not Asked   Other Topics Concern  . None   Social History Narrative  . None     Family History:  The patient's family history includes Lung disease in his mother; Stroke (age of onset: 20) in his father; Yves Dill Parkinson White syndrome in his brother.   ROS:   Please see the history of present illness.    ROS All other systems reviewed and are negative.   PHYSICAL EXAM:   VS:  BP 110/80   Pulse 84   Ht 5\' 11"  (1.803 m)   Wt 150 lb 12.8 oz (68.4 kg)   BMI 21.03 kg/m    GEN: Well nourished, thin BM, in no acute distress  HEENT: normal  Neck: no JVD, carotid bruits, or masses Cardiac: RRR; no rubs, or gallops,no edema   Respiratory:  clear to  auscultation bilaterally, normal work of breathing GI: soft, nontender, nondistended, + BS MS: no deformity or atrophy  Skin: warm and dry, no rash Neuro:  Alert and Oriented x 3, Strength and sensation are intact Psych: euthymic mood, full affect  Wt Readings from Last 3 Encounters:  12/26/16 150 lb 12.8 oz (68.4 kg)  11/20/16 148 lb (67.1 kg)  11/14/16 148 lb (67.1 kg)      Studies/Labs Reviewed:   EKG:  EKG is ordered today.  It shows NSR with atrial sensed and ventricular paced rhythm. Rate 77. I have personally reviewed and interpreted this study.   Recent Labs: 04/26/2016: B Natriuretic Peptide 2,761.6 10/26/2016: Magnesium 1.8 10/27/2016: ALT 10 11/04/2016: BUN 33; Creatinine, Ser 1.44; Hemoglobin 8.9; Platelets 285; Potassium 4.0; Sodium 135   Lipid Panel No results found for: CHOL, TRIG, HDL, CHOLHDL, VLDL, LDLCALC, LDLDIRECT  Additional studies/ records that were reviewed today include:   Echo 04/27/2016 LV EF: 50% -   55%  ------------------------------------------------------------------- Indications:      CHF - 428.0.  ------------------------------------------------------------------- History:   Risk factors:  History of alcohol abuse. Hypertension.     ------------------------------------------------------------------- Study Conclusions  - Left ventricle: The cavity size was normal. There was moderate   focal basal and moderate concentric hypertrophy of the left   ventricle. Systolic function was normal. The estimated ejection   fraction was in the range of 50% to 55%. Wall motion was normal;   there were no regional wall motion abnormalities. - Ventricular septum: The contour showed diastolic flattening and   systolic flattening. - Aortic valve: There was moderate regurgitation. - Mitral valve: There was severe regurgitation directed centrally   and posteriorly. Valve area by continuity equation (using LVOT   flow): 1.11 cm^2. - Left atrium: The atrium was moderately dilated. - Right ventricle: The cavity size was moderately dilated. Wall   thickness was normal. - Right atrium: The atrium was severely dilated. - Tricuspid valve: There was moderate regurgitation. - Pulmonary arteries: Systolic pressure was severely increased. PA   peak pressure: 71 mm Hg (S).  Impressions:  - Low normal LVEF, however with severe MR most probably   overestimated.   Mitral leaflets are thickened and mildly calcified. There is   severe mitral regurgitation (RV = 48ml, ERO 50 mm2).   Moderate aortic regurgitation.   Moderately dilated RV with normal RVEF. Severe pulmonary   hypertension.  Echo 10/30/16: Study Conclusions  - Left ventricle: Diffuse hypokinesis abnormal septal motion   Systolic function was mildly to moderately reduced. The estimated   ejection fraction was in the range of 40% to 45%. Diffuse   hypokinesis. The study is not technically sufficient to allow   evaluation of LV diastolic function. - Aortic valve: There was mild regurgitation. - Mitral valve: Normal appearing bioprosthetic MVR. No peri   valvular regurgitations  ASSESSMENT:    No diagnosis found.   PLAN:  In order of problems listed above:   1. Severe  MR: Moderate TR. Now s/p MVR with a bovine prosthesis and TV repair on 10/25/16 by Dr. Roxy Manns. Plan to repeat Echo in 4-6 weeks.  2. Chronic systolic CHF. Last EF 40-45% post op. Plan to repeat Echo at 3 months- late May. Continue lasix and Coreg. Not a candidate for ARB/ACEi due to CKD.   3. Hypertension: His blood pressure is well controlled  4. HLD: Continue on Lipitor 40 mg daily. Will check fasting chemistries and lipids today.  5. Atrial flutter post op valve  surgery. Now converted to NSR on amiodarone. Check chemistries and TSH. Continue amiodarone 200 mg daily and Coumadin. Check INR today. (patient misunderstood and has not taken coumadin since last INR check- will resume coumadin today).   6. CHB s/p BiV pacemaker  7. CKD stage 3  8. Chronic anticoagulation.    Medication Adjustments/Labs and Tests Ordered: Current medicines are reviewed at length with the patient today.  Concerns regarding medicines are outlined above.  Medication changes, Labs and Tests ordered today are listed in the Patient Instructions below. There are no Patient Instructions on file for this visit.   Signed, Kameshia Madruga Martinique, MD  12/26/2016 8:52 AM    [facility] [place], [place], [place]  [address] Phone: [phone]; Fax: [phone]

## 2016-12-26 ENCOUNTER — Encounter: Payer: Self-pay | Admitting: Cardiology

## 2016-12-26 ENCOUNTER — Ambulatory Visit (INDEPENDENT_AMBULATORY_CARE_PROVIDER_SITE_OTHER): Payer: Medicare HMO | Admitting: Pharmacist Clinician (PhC)/ Clinical Pharmacy Specialist

## 2016-12-26 ENCOUNTER — Ambulatory Visit (INDEPENDENT_AMBULATORY_CARE_PROVIDER_SITE_OTHER): Payer: Medicare HMO | Admitting: Cardiology

## 2016-12-26 VITALS — BP 110/80 | HR 84 | Ht 71.0 in | Wt 150.8 lb

## 2016-12-26 DIAGNOSIS — I484 Atypical atrial flutter: Secondary | ICD-10-CM | POA: Diagnosis not present

## 2016-12-26 DIAGNOSIS — I442 Atrioventricular block, complete: Secondary | ICD-10-CM | POA: Diagnosis not present

## 2016-12-26 DIAGNOSIS — I5022 Chronic systolic (congestive) heart failure: Secondary | ICD-10-CM | POA: Diagnosis not present

## 2016-12-26 DIAGNOSIS — Z9889 Other specified postprocedural states: Secondary | ICD-10-CM | POA: Diagnosis not present

## 2016-12-26 DIAGNOSIS — Z5181 Encounter for therapeutic drug level monitoring: Secondary | ICD-10-CM

## 2016-12-26 DIAGNOSIS — Z953 Presence of xenogenic heart valve: Secondary | ICD-10-CM | POA: Diagnosis not present

## 2016-12-26 DIAGNOSIS — Z7901 Long term (current) use of anticoagulants: Secondary | ICD-10-CM

## 2016-12-26 DIAGNOSIS — I340 Nonrheumatic mitral (valve) insufficiency: Secondary | ICD-10-CM | POA: Diagnosis not present

## 2016-12-26 DIAGNOSIS — I5042 Chronic combined systolic (congestive) and diastolic (congestive) heart failure: Secondary | ICD-10-CM

## 2016-12-26 DIAGNOSIS — Z950 Presence of cardiac pacemaker: Secondary | ICD-10-CM

## 2016-12-26 LAB — POCT INR: INR: 1.1

## 2016-12-26 NOTE — Patient Instructions (Signed)
Schedule Echo in 4 to 6 weeks   Fasting lab ( cmet,tsh,lipid panel,cbc )   Your physician recommends that you schedule a follow-up appointment in: 3 months

## 2016-12-28 LAB — CMP 10231
AG Ratio: 0.7 Ratio — ABNORMAL LOW (ref 1.0–2.5)
ALK PHOS: 81 U/L (ref 40–115)
ALT: 7 U/L — AB (ref 9–46)
AST: 13 U/L (ref 10–35)
Albumin: 3.5 g/dL — ABNORMAL LOW (ref 3.6–5.1)
BUN/Creatinine Ratio: 13.5 Ratio (ref 6–22)
BUN: 20 mg/dL (ref 7–25)
CHLORIDE: 102 mmol/L (ref 98–110)
CO2: 25 mmol/L (ref 20–31)
Calcium: 8.9 mg/dL (ref 8.6–10.3)
Creat: 1.48 mg/dL — ABNORMAL HIGH (ref 0.70–1.25)
GFR, EST AFRICAN AMERICAN: 57 mL/min — AB (ref >=60)
GFR, EST NON AFRICAN AMERICAN: 49 mL/min — AB (ref >=60)
GLUCOSE: 81 mg/dL (ref 65–99)
Globulin: 4.7 g/dL — ABNORMAL HIGH (ref 1.9–3.7)
POTASSIUM: 5.3 mmol/L (ref 3.5–5.3)
Sodium: 139 mmol/L (ref 135–146)
TOTAL PROTEIN: 8.2 g/dL — AB (ref 6.1–8.1)
Total Bilirubin: 0.4 mg/dL (ref 0.2–1.2)

## 2016-12-28 LAB — CBC WITH DIFFERENTIAL/PLATELET
BASOS PCT: 1 %
Basophils Absolute: 62 cells/uL (ref 0–200)
EOS ABS: 372 {cells}/uL (ref 15–500)
Eosinophils Relative: 6 %
HEMATOCRIT: 34.4 % — AB (ref 38.5–50.0)
Hemoglobin: 10.9 g/dL — ABNORMAL LOW (ref 13.2–17.1)
LYMPHS PCT: 33 %
Lymphs Abs: 2046 cells/uL (ref 850–3900)
MCH: 30.5 pg (ref 27.0–33.0)
MCHC: 31.7 g/dL — ABNORMAL LOW (ref 32.0–36.0)
MCV: 96.4 fL (ref 80.0–100.0)
MPV: 9.1 fL (ref 7.5–12.5)
Monocytes Absolute: 620 cells/uL (ref 200–950)
Monocytes Relative: 10 %
NEUTROS ABS: 3100 {cells}/uL (ref 1500–7800)
Neutrophils Relative %: 50 %
PLATELETS: 290 10*3/uL (ref 140–400)
RBC: 3.57 MIL/uL — AB (ref 4.20–5.80)
RDW: 17 % — AB (ref 11.0–15.0)
WBC: 6.2 10*3/uL (ref 3.8–10.8)

## 2016-12-28 LAB — LIPID PANEL
CHOLESTEROL: 146 mg/dL (ref <200)
HDL: 62 mg/dL (ref >40)
LDL Cholesterol: 68 mg/dL (ref <100)
Total CHOL/HDL Ratio: 2.4 Ratio (ref <5.0)
Triglycerides: 82 mg/dL (ref <150)
VLDL: 16 mg/dL (ref <30)

## 2016-12-28 LAB — TSH: TSH: 2.1 m[IU]/L (ref 0.40–4.50)

## 2016-12-29 ENCOUNTER — Other Ambulatory Visit: Payer: Self-pay | Admitting: Physician Assistant

## 2017-01-05 ENCOUNTER — Ambulatory Visit (INDEPENDENT_AMBULATORY_CARE_PROVIDER_SITE_OTHER): Payer: Medicare HMO | Admitting: Pharmacist Clinician (PhC)/ Clinical Pharmacy Specialist

## 2017-01-05 ENCOUNTER — Other Ambulatory Visit: Payer: Self-pay | Admitting: *Deleted

## 2017-01-05 ENCOUNTER — Other Ambulatory Visit: Payer: Self-pay | Admitting: Physician Assistant

## 2017-01-05 ENCOUNTER — Other Ambulatory Visit: Payer: Self-pay | Admitting: Pharmacist Clinician (PhC)/ Clinical Pharmacy Specialist

## 2017-01-05 DIAGNOSIS — Z953 Presence of xenogenic heart valve: Secondary | ICD-10-CM | POA: Diagnosis not present

## 2017-01-05 DIAGNOSIS — Z7901 Long term (current) use of anticoagulants: Secondary | ICD-10-CM

## 2017-01-05 DIAGNOSIS — Z9889 Other specified postprocedural states: Secondary | ICD-10-CM | POA: Diagnosis not present

## 2017-01-05 DIAGNOSIS — Z5181 Encounter for therapeutic drug level monitoring: Secondary | ICD-10-CM | POA: Diagnosis not present

## 2017-01-05 LAB — POCT INR: INR: 1.2

## 2017-01-05 MED ORDER — POTASSIUM CHLORIDE CRYS ER 20 MEQ PO TBCR: 20.0000 meq | EXTENDED_RELEASE_TABLET | Freq: Every day | ORAL | 3 refills | Status: DC

## 2017-01-05 MED ORDER — WARFARIN SODIUM 5 MG PO TABS: ORAL_TABLET | ORAL | 3 refills | Status: DC

## 2017-01-05 NOTE — Telephone Encounter (Signed)
Pharmacy requests ninety day. 

## 2017-01-11 ENCOUNTER — Other Ambulatory Visit: Payer: Self-pay | Admitting: *Deleted

## 2017-01-11 MED ORDER — AMIODARONE HCL 200 MG PO TABS: 200.0000 mg | ORAL_TABLET | Freq: Two times a day (BID) | ORAL | 11 refills | Status: DC

## 2017-01-12 ENCOUNTER — Other Ambulatory Visit: Payer: Self-pay | Admitting: *Deleted

## 2017-01-12 MED ORDER — AMIODARONE HCL 200 MG PO TABS: 200.0000 mg | ORAL_TABLET | Freq: Two times a day (BID) | ORAL | 11 refills | Status: DC

## 2017-01-15 ENCOUNTER — Ambulatory Visit (INDEPENDENT_AMBULATORY_CARE_PROVIDER_SITE_OTHER): Payer: Medicare HMO | Admitting: Pharmacist

## 2017-01-15 DIAGNOSIS — Z7901 Long term (current) use of anticoagulants: Secondary | ICD-10-CM

## 2017-01-15 DIAGNOSIS — Z9889 Other specified postprocedural states: Secondary | ICD-10-CM

## 2017-01-15 DIAGNOSIS — Z953 Presence of xenogenic heart valve: Secondary | ICD-10-CM

## 2017-01-15 DIAGNOSIS — Z5181 Encounter for therapeutic drug level monitoring: Secondary | ICD-10-CM

## 2017-01-15 LAB — POCT INR: INR: 2.7

## 2017-01-29 ENCOUNTER — Ambulatory Visit (INDEPENDENT_AMBULATORY_CARE_PROVIDER_SITE_OTHER): Payer: Medicare HMO | Admitting: Thoracic Surgery (Cardiothoracic Vascular Surgery)

## 2017-01-29 ENCOUNTER — Ambulatory Visit (INDEPENDENT_AMBULATORY_CARE_PROVIDER_SITE_OTHER): Payer: Medicare HMO | Admitting: Pharmacist Clinician (PhC)/ Clinical Pharmacy Specialist

## 2017-01-29 ENCOUNTER — Encounter: Payer: Self-pay | Admitting: Thoracic Surgery (Cardiothoracic Vascular Surgery)

## 2017-01-29 VITALS — BP 137/90 | HR 76 | Resp 16 | Ht 71.0 in | Wt 149.0 lb

## 2017-01-29 DIAGNOSIS — Z9889 Other specified postprocedural states: Secondary | ICD-10-CM

## 2017-01-29 DIAGNOSIS — Z7901 Long term (current) use of anticoagulants: Secondary | ICD-10-CM

## 2017-01-29 DIAGNOSIS — Z953 Presence of xenogenic heart valve: Secondary | ICD-10-CM

## 2017-01-29 DIAGNOSIS — Z952 Presence of prosthetic heart valve: Secondary | ICD-10-CM

## 2017-01-29 DIAGNOSIS — Z5181 Encounter for therapeutic drug level monitoring: Secondary | ICD-10-CM

## 2017-01-29 LAB — POCT INR: INR: 3.1

## 2017-01-29 NOTE — Patient Instructions (Signed)

## 2017-01-29 NOTE — Progress Notes (Signed)
[address]       [place] [address]             [phone]     CARDIOTHORACIC SURGERY OFFICE NOTE  Referring Provider is Martinique, Peter M, MD PCP is Patient, No Pcp Per   HPI:  Patient is a 66 year old African-American male with history of mitral regurgitation, tricuspid regurgitation, chronic combined systolic and diastolic congestive heart failure, right bundle branch block, hypertension, stage III chronic kidney disease, hyperlipidemia, and previous heavy alcohol and tobacco abuse who returns to the office today for routine follow-up approximately 3 months status post minimally invasive mitral valve replacement using a bioprosthetic tissue valve and tricuspid valve repair on 10/25/2016 for what turned out to be 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  The patient's early postoperative recovery was notable for the development of complete heart block and atrial flutter, and he eventually underwent placement of biventricular pacer on 11/03/2016.  He was discharged home the following day and was last seen here in our office on 11/20/2016 at which time he was doing fairly well.  He was seen in follow-up by Dr. Martinique on 12/26/2016 at which time he was maintaining sinus rhythm. He returns for office today and reports that he continues to do well. He is walking every day, at least a quarter mile at a time. He reports no exertional shortness of breath and he states that his breathing is notably better than it was prior to surgery. He still has some soreness across her right chest related to his mini thoracotomy incision. Appetite is good. Energy level is good. He has been doing well with Coumadin therapy. He is scheduled for routine follow-up echocardiogram in appointment with Dr. Martinique later this  week.   Current Outpatient Prescriptions  Medication Sig Dispense Refill  . amiodarone (PACERONE) 200 MG tablet Take 1 tablet (200 mg total) by mouth 2 (two) times daily after a meal. 60 tablet 11  . carvedilol (COREG) 6.25 MG tablet TAKE 1 TABLET (6.25 MG TOTAL) BY MOUTH 2 (TWO) TIMES DAILY WITH A MEAL. 60 tablet 8  . folic acid (FOLVITE) 1 MG tablet Take 1 tablet (1 mg total) by mouth daily. 30 tablet 11  . furosemide (LASIX) 40 MG tablet TAKE 1 TABLET BY MOUTH DAILY 30 tablet 6  . Multiple Vitamin (MULTIVITAMIN) tablet Take 1 tablet by mouth daily.    . potassium chloride SA (K-DUR,KLOR-CON) 20 MEQ tablet Take 1 tablet (20 mEq total) by mouth daily. 30 tablet 3  . thiamine (VITAMIN B-1) 100 MG tablet Take 1 tablet (100 mg total) by mouth daily. 30 tablet 11  . warfarin (COUMADIN) 5 MG tablet Take 1/2 to 1 tablet by mouth daily as directed by coumadin clinic 30 tablet 3   No current facility-administered medications for this visit.       Physical Exam:   BP 137/90 (BP Location: Right Arm, Patient Position: Sitting, Cuff Size: Normal)   Pulse 76   Resp 16   Ht 5\' 11"  (1.803 m)   Wt 149 lb (67.6 kg)   SpO2 99% Comment: RA  BMI 20.78 kg/m   General:  Well-appearing  Chest:   Clear to auscultation  CV:   Regular rate and rhythm without murmur  Incisions:  Completely healed  Abdomen:  Soft nontender  Extremities:  Warm and well-perfused  Diagnostic  Tests:  n/a   Impression:  Patient is doing well approximately 3 months status post minimally invasive mitral valve replacement using a bioprosthetic tissue valve and tricuspid valve repair.  Plan:  We have not recommended any changes to the patient's current medications.  I think it would be reasonable to stop amiodarone in the near future if he continues to maintain sinus rhythm. I've encouraged the patient to continue to gradually increase his physical activity without any particular limitations at this time.  The patient has  been reminded regarding the importance of dental hygiene and the lifelong need for antibiotic prophylaxis for all dental cleanings and other related invasive procedures.  Patient will continue to follow-up closely with Dr. Martinique and return to our office for routine follow-up next March, approximately 1 year following his original surgery. He will call and return sooner should specific problems or questions arise.  I spent in excess of 15 minutes during the conduct of this office consultation and >50% of this time involved direct face-to-face encounter with the patient for counseling and/or coordination of their care.    Valentina Gu. Roxy Manns, MD 01/29/2017 11:06 AM

## 2017-01-31 ENCOUNTER — Other Ambulatory Visit: Payer: Self-pay

## 2017-01-31 ENCOUNTER — Ambulatory Visit (HOSPITAL_COMMUNITY): Payer: Medicare HMO | Attending: Cardiology

## 2017-01-31 DIAGNOSIS — F1010 Alcohol abuse, uncomplicated: Secondary | ICD-10-CM | POA: Insufficient documentation

## 2017-01-31 DIAGNOSIS — I509 Heart failure, unspecified: Secondary | ICD-10-CM | POA: Diagnosis not present

## 2017-01-31 DIAGNOSIS — N189 Chronic kidney disease, unspecified: Secondary | ICD-10-CM | POA: Diagnosis not present

## 2017-01-31 DIAGNOSIS — I130 Hypertensive heart and chronic kidney disease with heart failure and stage 1 through stage 4 chronic kidney disease, or unspecified chronic kidney disease: Secondary | ICD-10-CM | POA: Insufficient documentation

## 2017-01-31 DIAGNOSIS — Z953 Presence of xenogenic heart valve: Secondary | ICD-10-CM | POA: Insufficient documentation

## 2017-01-31 DIAGNOSIS — I340 Nonrheumatic mitral (valve) insufficiency: Secondary | ICD-10-CM | POA: Insufficient documentation

## 2017-01-31 DIAGNOSIS — I351 Nonrheumatic aortic (valve) insufficiency: Secondary | ICD-10-CM | POA: Diagnosis not present

## 2017-01-31 DIAGNOSIS — I5042 Chronic combined systolic (congestive) and diastolic (congestive) heart failure: Secondary | ICD-10-CM | POA: Insufficient documentation

## 2017-02-01 ENCOUNTER — Other Ambulatory Visit: Payer: Self-pay

## 2017-02-01 MED ORDER — ISOSORBIDE MONONITRATE ER 30 MG PO TB24: 30.0000 mg | ORAL_TABLET | Freq: Every day | ORAL | 6 refills | Status: AC

## 2017-02-02 ENCOUNTER — Ambulatory Visit (INDEPENDENT_AMBULATORY_CARE_PROVIDER_SITE_OTHER): Payer: Medicare HMO | Admitting: Internal Medicine

## 2017-02-02 ENCOUNTER — Encounter: Payer: Self-pay | Admitting: Internal Medicine

## 2017-02-02 VITALS — BP 132/80 | HR 74 | Ht 71.0 in | Wt 152.2 lb

## 2017-02-02 DIAGNOSIS — Z950 Presence of cardiac pacemaker: Secondary | ICD-10-CM | POA: Diagnosis not present

## 2017-02-02 DIAGNOSIS — I5032 Chronic diastolic (congestive) heart failure: Secondary | ICD-10-CM | POA: Diagnosis not present

## 2017-02-02 DIAGNOSIS — I442 Atrioventricular block, complete: Secondary | ICD-10-CM | POA: Diagnosis not present

## 2017-02-02 LAB — CUP PACEART INCLINIC DEVICE CHECK
Brady Statistic RV Percent Paced: 99.95 %
Implantable Lead Implant Date: 20180309
Implantable Lead Implant Date: 20180309
Implantable Lead Location: 753860
Implantable Pulse Generator Implant Date: 20180309
Lead Channel Impedance Value: 450 Ohm
Lead Channel Pacing Threshold Pulse Width: 0.5 ms
Lead Channel Sensing Intrinsic Amplitude: 2.2 mV
Lead Channel Sensing Intrinsic Amplitude: 7.6 mV
Lead Channel Setting Pacing Amplitude: 2.5 V
Lead Channel Setting Pacing Pulse Width: 0.05 ms
Lead Channel Setting Pacing Pulse Width: 1 ms
MDC IDC LEAD IMPLANT DT: 20180309
MDC IDC LEAD LOCATION: 753859
MDC IDC LEAD LOCATION: 753860
MDC IDC MSMT BATTERY REMAINING LONGEVITY: 76 mo
MDC IDC MSMT BATTERY VOLTAGE: 2.96 V
MDC IDC MSMT LEADCHNL LV PACING THRESHOLD AMPLITUDE: 0.75 V
MDC IDC MSMT LEADCHNL LV PACING THRESHOLD PULSEWIDTH: 1 ms
MDC IDC MSMT LEADCHNL RA IMPEDANCE VALUE: 475 Ohm
MDC IDC MSMT LEADCHNL RA PACING THRESHOLD AMPLITUDE: 0.75 V
MDC IDC MSMT LEADCHNL RA PACING THRESHOLD PULSEWIDTH: 0.5 ms
MDC IDC MSMT LEADCHNL RV IMPEDANCE VALUE: 550 Ohm
MDC IDC MSMT LEADCHNL RV PACING THRESHOLD AMPLITUDE: 0.5 V
MDC IDC PG SERIAL: 8006947
MDC IDC SESS DTM: 20180608141829
MDC IDC SET LEADCHNL RA PACING AMPLITUDE: 2 V
MDC IDC SET LEADCHNL RV PACING AMPLITUDE: 0.25 V
MDC IDC SET LEADCHNL RV SENSING SENSITIVITY: 2 mV
MDC IDC STAT BRADY RA PERCENT PACED: 13 %

## 2017-02-02 NOTE — Progress Notes (Signed)
HPI Mr. Luhn returns today for followup. He is a pleasant 66 yo man who underwent mitral valve repair and had intermittent AV block as well as atrial fib and flutter with a RVR and underwent insertion of a DDD His bundle PM 3 months ago. In the interim, he has done well with improvement in his activity. No syncope. No edema. No palpitations. Allergies  Allergen Reactions  . No Known Allergies      Current Outpatient Prescriptions  Medication Sig Dispense Refill  . amiodarone (PACERONE) 200 MG tablet Take 1 tablet (200 mg total) by mouth 2 (two) times daily after a meal. 60 tablet 11  . carvedilol (COREG) 6.25 MG tablet TAKE 1 TABLET (6.25 MG TOTAL) BY MOUTH 2 (TWO) TIMES DAILY WITH A MEAL. 60 tablet 8  . folic acid (FOLVITE) 1 MG tablet Take 1 tablet (1 mg total) by mouth daily. 30 tablet 11  . furosemide (LASIX) 40 MG tablet TAKE 1 TABLET BY MOUTH DAILY 30 tablet 6  . isosorbide mononitrate (IMDUR) 30 MG 24 hr tablet Take 1 tablet (30 mg total) by mouth daily. 30 tablet 6  . Multiple Vitamin (MULTIVITAMIN) tablet Take 1 tablet by mouth daily.    . potassium chloride SA (K-DUR,KLOR-CON) 20 MEQ tablet Take 1 tablet (20 mEq total) by mouth daily. 30 tablet 3  . thiamine (VITAMIN B-1) 100 MG tablet Take 1 tablet (100 mg total) by mouth daily. 30 tablet 11  . warfarin (COUMADIN) 5 MG tablet Take 1/2 to 1 tablet by mouth daily as directed by coumadin clinic 30 tablet 3   No current facility-administered medications for this visit.      Past Medical History:  Diagnosis Date  . CHF (congestive heart failure) ([place])    a. 04/2016: echo with EF of 50-55%, previously reported 30-35% by outside records.  . Chronic combined systolic and diastolic congestive heart failure ([place])   . CKD (chronic kidney disease) stage 3, GFR 30-59 ml/min   . H/O ETOH abuse   . Hypertension   . Mitral regurgitation    a. severe by echo in 04/2016  . Prostate cancer ([place])   . 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   Past Surgical History:  Procedure Laterality Date  . APPENDECTOMY    . BIV PACEMAKER INSERTION CRT-P N/A 11/03/2016   Procedure: BiV Pacemaker Insertion CRT-P;  Surgeon: Evans Lance, MD;  Location: [place] CV LAB;  Service: Cardiovascular;  Laterality: N/A; St Jude  . CARDIAC CATHETERIZATION N/A 09/06/2016   Procedure: Right/Left Heart Cath and Coronary Angiography;  Surgeon: Peter M Martinique, MD;  Location: [place] CV LAB;  Service: Cardiovascular;  Laterality: N/A;  . MINIMALLY INVASIVE TRICUSPID VALVE REPAIR Right 10/25/2016   Procedure: MINIMALLY INVASIVE TRICUSPID VALVE REPAIR;  Surgeon: [name], MD;  Location: [place];  Service: Open Heart Surgery;  Laterality: Right;  . MITRAL VALVE REPLACEMENT Right 10/25/2016   Procedure: MINIMALLY INVASIVE MITRAL VALVE (MV) REPLACEMENT;  Surgeon: [name], MD;  Location: [place];  Service: Open Heart Surgery;  Laterality: Right;  . PROSTATE SURGERY    . TEE WITHOUT CARDIOVERSION N/A 09/06/2016   Procedure: TRANSESOPHAGEAL ECHOCARDIOGRAM (TEE);  Surgeon: Thayer Headings, MD;  Location: [place];  Service: Cardiovascular;  Laterality: N/A;  . TEE WITHOUT CARDIOVERSION N/A 10/25/2016   Procedure: TRANSESOPHAGEAL ECHOCARDIOGRAM (TEE);  Surgeon: [name], MD;  Location: [place];  Service: Open Heart Surgery;  Laterality: N/A;     Family History  Problem Relation Age of Onset  . Lung disease Mother   . Stroke Father 37  . [place] White syndrome Brother      Social History   Social History  . Marital status: Single    Spouse name: N/A  . Number of children: N/A  . Years of education: N/A   Occupational History  . Not on file.   Social History Main Topics  . Smoking  status: Former Smoker    Packs/day: 1.00    Years: 50.00    Quit date: 04/16/2016  . Smokeless tobacco: Never Used  . Alcohol use No     Comment: no alcohol since 03/2016  . Drug use: No  . Sexual activity: Not on file   Other Topics Concern  . Not on file   Social History Narrative  . No narrative on file     BP 132/80   Pulse 74   Ht 5\' 11"  (1.803 m)   Wt 152 lb 3.2 oz (69 kg)   SpO2 96%   BMI 21.23 kg/m   Physical Exam:  Well appearing NAD HEENT: Unremarkable Neck:  No JVD, no thyromegally Lymphatics:  No adenopathy Back:  No CVA tenderness Lungs:  Clear with no wheezes HEART:  Regular rate rhythm, no murmurs, no rubs, no clicks Abd:  soft, positive bowel sounds, no organomegally, no rebound, no guarding Ext:  2 plus pulses, no edema, no cyanosis, no clubbing Skin:  No rashes no nodules Neuro:  CN II through XII intact, motor grossly intact  EKG - nsr with ventricular pacing.   DEVICE  Normal device function.  See PaceArt for details.   Assess/Plan: 1. AV block - his conduction is back today with RBBB. He will undergo watchful waiting. 2. PPM - his His bundle DDD PM is working normally. Will recheck in several months. His RV output remains subthreshold. 3. Mitral valve repair - he has no murmur on exam and appears to be doing well. 4. HTN - his blood pressure today is reasonably well controlled. I have encouraged him to maintain a low sodium diet.  Mikle Bosworth.D.

## 2017-02-02 NOTE — Patient Instructions (Signed)
Medication Instructions:  Your physician recommends that you continue on your current medications as directed. Please refer to the Current Medication list given to you today.   Labwork: None ordered   Testing/Procedures: None ordered   Follow-Up: Your physician wants you to follow-up in: 9 months with Dr. Lovena Le.  You will receive a reminder letter in the mail two months in advance. If you don't receive a letter, please call our office to schedule the follow-up appointment.  Remote monitoring is used to monitor your Pacemaker from home. This monitoring reduces the number of office visits required to check your device to one time per year. It allows [place] to keep an eye on the functioning of your device to ensure it is working properly. You are scheduled for a device check from home on 05/07/17. You may send your transmission at any time that day. If you have a wireless device, the transmission will be sent automatically. After your physician reviews your transmission, you will receive a postcard with your next transmission date.    Any Other Special Instructions Will Be Listed Below (If Applicable).     If you need a refill on your cardiac medications before your next appointment, please call your pharmacy.

## 2017-02-05 ENCOUNTER — Other Ambulatory Visit: Payer: Self-pay

## 2017-02-05 ENCOUNTER — Telehealth: Payer: Self-pay

## 2017-02-05 DIAGNOSIS — M79662 Pain in left lower leg: Principal | ICD-10-CM

## 2017-02-05 DIAGNOSIS — M79661 Pain in right lower leg: Secondary | ICD-10-CM

## 2017-02-05 NOTE — Telephone Encounter (Signed)
Patient was called 02/01/17 with echo results.Patient stated he has been having pain in both legs when he walks for the past couple of months.Stated he is unable to walk much distance without pain.Dr.Jordan advised LE Arterial dopplers.Dopplers scheduled 02/21/17 at 3:30 pm.

## 2017-02-12 ENCOUNTER — Other Ambulatory Visit: Payer: Self-pay | Admitting: Cardiology

## 2017-02-12 DIAGNOSIS — M79662 Pain in left lower leg: Principal | ICD-10-CM

## 2017-02-12 DIAGNOSIS — M79661 Pain in right lower leg: Secondary | ICD-10-CM

## 2017-02-19 ENCOUNTER — Ambulatory Visit (INDEPENDENT_AMBULATORY_CARE_PROVIDER_SITE_OTHER): Payer: Medicare HMO | Admitting: Pharmacist Clinician (PhC)/ Clinical Pharmacy Specialist

## 2017-02-19 ENCOUNTER — Other Ambulatory Visit: Payer: Self-pay | Admitting: Physician Assistant

## 2017-02-19 DIAGNOSIS — Z953 Presence of xenogenic heart valve: Secondary | ICD-10-CM | POA: Diagnosis not present

## 2017-02-19 DIAGNOSIS — Z5181 Encounter for therapeutic drug level monitoring: Secondary | ICD-10-CM | POA: Diagnosis not present

## 2017-02-19 DIAGNOSIS — Z7901 Long term (current) use of anticoagulants: Secondary | ICD-10-CM | POA: Diagnosis not present

## 2017-02-19 DIAGNOSIS — Z9889 Other specified postprocedural states: Secondary | ICD-10-CM

## 2017-02-19 LAB — POCT INR: INR: 5.4

## 2017-02-21 ENCOUNTER — Ambulatory Visit (HOSPITAL_COMMUNITY)
Admission: RE | Admit: 2017-02-21 | Discharge: 2017-02-21 | Disposition: A | Discharge status: Home / Self Care | Payer: Medicare HMO | Source: Ambulatory Visit | Attending: Internal Medicine | Admitting: Internal Medicine

## 2017-02-21 DIAGNOSIS — M79661 Pain in right lower leg: Secondary | ICD-10-CM

## 2017-02-21 DIAGNOSIS — E785 Hyperlipidemia, unspecified: Secondary | ICD-10-CM | POA: Diagnosis not present

## 2017-02-21 DIAGNOSIS — M79662 Pain in left lower leg: Secondary | ICD-10-CM | POA: Insufficient documentation

## 2017-02-21 DIAGNOSIS — Z87891 Personal history of nicotine dependence: Secondary | ICD-10-CM | POA: Insufficient documentation

## 2017-02-21 DIAGNOSIS — I10 Essential (primary) hypertension: Secondary | ICD-10-CM | POA: Insufficient documentation

## 2017-02-21 DIAGNOSIS — I739 Peripheral vascular disease, unspecified: Secondary | ICD-10-CM | POA: Diagnosis present

## 2017-02-27 ENCOUNTER — Telehealth: Payer: Self-pay | Admitting: Cardiology

## 2017-02-27 MED ORDER — POTASSIUM CHLORIDE CRYS ER 20 MEQ PO TBCR: 20.0000 meq | EXTENDED_RELEASE_TABLET | Freq: Every day | ORAL | 12 refills | Status: AC

## 2017-02-27 NOTE — Telephone Encounter (Signed)
Refill sent to the pharmacy electronically.  

## 2017-02-27 NOTE — Telephone Encounter (Signed)
°*  STAT* If patient is at the pharmacy, call can be transferred to refill team.   1. Which medications need to be refilled? (please list name of each medication and dose if known) klor-con 20 MEQ  2. Which pharmacy/location (including street and city if local pharmacy) is medication to be sent to?[id number] [place], [place] - [address]  3. Do they need a 30 day or 90 day supply? [place]

## 2017-02-28 ENCOUNTER — Other Ambulatory Visit: Payer: Self-pay | Admitting: Physician Assistant

## 2017-02-28 NOTE — Progress Notes (Signed)
Cardiology Office Note    Date:  03/01/2017   ID:  Aaron Bennett, DOB Nov 06, 1950, MRN [id number]  PCP:  Patient, No Pcp Per  Cardiologist:  Dr. Rowdy Guerrini Martinique  Chief Complaint  Patient presents with  . Follow-up    F/U after echo.  . Atrial Flutter  . Congestive Heart Failure    History of Present Illness:  Aaron Bennett is a 66 y.o. male with PMH of HTN, HLD, prostate CA, stage III CKD, and h/o alcohol abuse. He was hospitalized for CHF exacerbation from 8/20-8/23/2017 at [facility]. Echocardiogram obtained during that admission showed reduced ejection fraction of 30-35% with severe MR and TR. 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 He did have alcohol withdrawal with seizure during that admission. He was not prescribed diuretic at the time of discharge due to elevated creatinine. He went to stay with his nephew in [place] area after discharge. Patient became short of breath and came to the emergency room at [facility] on 04/26/2016 with congestive heart failure. He was admitted and diuresed. A repeat echo obtained on 04/27/2016 showed improved ejection fraction of 50-55% with severe MR. After a period of time due to social factors he underwent TEE in January 2018 showing severe MR, moderate TR, RV dysfunction. LV EF 50-55%. Right and left heart cath performed showing no significant CAD. Large V waves c/w MR. Moderate to severe pulmonary HTN. On 10/31/16 he underwent minimally invasive MVR with a #31 Edwards Magna Bovine prosthesis and tricuspid valve repair with annuloplasty by Dr. Roxy Manns. Intraoperative TEE showed EF 35% which worsened acutely coming off bypass. He was treated with inotropes. Post op he developed CHB as well as atrial flutter. He had a BiV pacemaker placed by EP. He was also treated for volume overload and PNA. Repeat Echo post op showed EF 40-45% with good prosthetic valve function. He was anticoagulated with coumadin but  on his last visit misunderstood his directions and had stopped coumadin. This was resumed. Repeat Echo in June showed drop in EF to 35% again. Imdur was added.   Seen in follow up on March 20th. He was still in atrial flutter with controlled rate. On follow up in May and when seen in device clinic on 02/02/17 by Dr. Lovena Le he was back in NSR.   On follow up today he states he feels very well. No chest pain or SOB. No edema. States he is walking some every day.  No dizziness or syncope. Denies any palpitations. No bleeding on coumadin. He is tolerating medication well.    Past Medical History:  Diagnosis Date  . CHF (congestive heart failure) ([place])    a. 04/2016: echo with EF of 50-55%, previously reported 30-35% by outside records.  . Chronic combined systolic and diastolic congestive heart failure ([place])   . CKD (chronic kidney disease) stage 3, GFR 30-59 ml/min   . H/O ETOH abuse   . Hypertension   . Mitral regurgitation    a. severe by echo in 04/2016  . Prostate cancer ([place])   . S/P minimally invasive mitral valve replacement with bioprosthetic valve 10/25/2016   31 mm [facility] Mitral bovine bioprosthetic tissue valve placed via right mini thoracotomy approach    Past Surgical History:  Procedure Laterality Date  . APPENDECTOMY    . BIV PACEMAKER INSERTION CRT-P N/A 11/03/2016   Procedure: BiV Pacemaker Insertion CRT-P;  Surgeon: Evans Lance, MD;  Location: [place] CV LAB;  Service: Cardiovascular;  Laterality: N/A; St Jude  . CARDIAC CATHETERIZATION N/A 09/06/2016   Procedure: Right/Left Heart Cath and Coronary Angiography;  Surgeon: Jahir Halt M Martinique, MD;  Location: [place] CV LAB;  Service: Cardiovascular;  Laterality: N/A;  . MINIMALLY INVASIVE TRICUSPID VALVE REPAIR Right 10/25/2016   Procedure: MINIMALLY INVASIVE TRICUSPID VALVE REPAIR;  Surgeon: [name], MD;  Location: [place];  Service: Open Heart Surgery;  Laterality: Right;  . MITRAL VALVE REPLACEMENT Right  10/25/2016   Procedure: MINIMALLY INVASIVE MITRAL VALVE (MV) REPLACEMENT;  Surgeon: [name], MD;  Location: [place];  Service: Open Heart Surgery;  Laterality: Right;  . PROSTATE SURGERY    . TEE WITHOUT CARDIOVERSION N/A 09/06/2016   Procedure: TRANSESOPHAGEAL ECHOCARDIOGRAM (TEE);  Surgeon: Thayer Headings, MD;  Location: [place];  Service: Cardiovascular;  Laterality: N/A;  . TEE WITHOUT CARDIOVERSION N/A 10/25/2016   Procedure: TRANSESOPHAGEAL ECHOCARDIOGRAM (TEE);  Surgeon: [name], MD;  Location: [place];  Service: Open Heart Surgery;  Laterality: N/A;    Current Medications: Outpatient Medications Prior to Visit  Medication Sig Dispense Refill  . carvedilol (COREG) 6.25 MG tablet TAKE 1 TABLET (6.25 MG TOTAL) BY MOUTH 2 (TWO) TIMES DAILY WITH A MEAL. 60 tablet 8  . folic acid (FOLVITE) 1 MG tablet Take 1 tablet (1 mg total) by mouth daily. 30 tablet 11  . furosemide (LASIX) 40 MG tablet TAKE 1 TABLET BY MOUTH DAILY 30 tablet 6  . isosorbide mononitrate (IMDUR) 30 MG 24 hr tablet Take 1 tablet (30 mg total) by mouth daily. 30 tablet 6  . Multiple Vitamin (MULTIVITAMIN) tablet Take 1 tablet by mouth daily.    . potassium chloride SA (K-DUR,KLOR-CON) 20 MEQ tablet Take 1 tablet (20 mEq total) by mouth daily. 30 tablet 12  . thiamine (VITAMIN B-1) 100 MG tablet Take 1 tablet (100 mg total) by mouth daily. 30 tablet 11  . warfarin (COUMADIN) 5 MG tablet Take 1/2 to 1 tablet by mouth daily as directed by coumadin clinic 30 tablet 3  . amiodarone (PACERONE) 200 MG tablet Take 1 tablet (200 mg total) by mouth 2 (two) times daily after a meal. 60 tablet 11   No facility-administered medications prior to visit.      Allergies:   No known allergies   Social History   Social History  . Marital status: Single    Spouse name: N/A  . Number of children: N/A  . Years of education: N/A   Social History Main Topics  . Smoking status: Former Smoker    Packs/day: 1.00    Years:  50.00    Quit date: 04/16/2016  . Smokeless tobacco: Never Used  . Alcohol use No     Comment: no alcohol since 03/2016  . Drug use: No  . Sexual activity: Not Asked   Other Topics Concern  . None   Social History Narrative  . None     Family History:  The patient's family history includes Lung disease in his mother; Stroke (age of onset: 60) in his father; Y[place] Parkinson White syndrome in his brother.   ROS:   Please see the history of present illness.    ROS All other systems reviewed and are negative.   PHYSICAL EXAM:   VS:  BP 124/88   Pulse 76   Ht 5\' 11"  (1.803 m)   Wt 150 lb (68 kg)   BMI 20.92 kg/m    GEN: Well nourished, thin BM, in no acute distress  HEENT: normal  Neck: no JVD, carotid bruits, or masses Cardiac: RRR; no rubs, or gallops,no edema   Respiratory:  clear to auscultation bilaterally, normal work of breathing GI: soft, nontender, nondistended, + BS MS: no deformity or atrophy  Skin: warm and dry, no rash Neuro:  Alert and Oriented x 3, Strength and sensation are intact Psych: euthymic mood, full affect  Wt Readings from Last 3 Encounters:  03/01/17 150 lb (68 kg)  02/02/17 152 lb 3.2 oz (69 kg)  01/29/17 149 lb (67.6 kg)      Studies/Labs Reviewed:   EKG:  EKG is ordered today.  It shows NSR with atrial sensed and ventricular paced rhythm. Rate 77. I have personally reviewed and interpreted this study.   Recent Labs: 04/26/2016: B Natriuretic Peptide 2,761.6 10/26/2016: Magnesium 1.8 12/28/2016: ALT 7; BUN 20; Creat 1.48; Hemoglobin 10.9; Platelets 290; Potassium 5.3; Sodium 139; TSH 2.10   Lipid Panel    Component Value Date/Time   CHOL 146 12/28/2016 1018   TRIG 82 12/28/2016 1018   HDL 62 12/28/2016 1018   CHOLHDL 2.4 12/28/2016 1018   VLDL 16 12/28/2016 1018   LDLCALC 68 12/28/2016 1018    Additional studies/ records that were reviewed today include:   Echo 04/27/2016 LV EF: 50% -    55%  ------------------------------------------------------------------- Indications:      CHF - 428.0.  ------------------------------------------------------------------- History:   Risk factors:  History of alcohol abuse. Hypertension.   ------------------------------------------------------------------- Study Conclusions  - Left ventricle: The cavity size was normal. There was moderate   focal basal and moderate concentric hypertrophy of the left   ventricle. Systolic function was normal. The estimated ejection   fraction was in the range of 50% to 55%. Wall motion was normal;   there were no regional wall motion abnormalities. - Ventricular septum: The contour showed diastolic flattening and   systolic flattening. - Aortic valve: There was moderate regurgitation. - Mitral valve: There was severe regurgitation directed centrally   and posteriorly. Valve area by continuity equation (using LVOT   flow): 1.11 cm^2. - Left atrium: The atrium was moderately dilated. - Right ventricle: The cavity size was moderately dilated. Wall   thickness was normal. - Right atrium: The atrium was severely dilated. - Tricuspid valve: There was moderate regurgitation. - Pulmonary arteries: Systolic pressure was severely increased. PA   peak pressure: 71 mm Hg (S).  Impressions:  - Low normal LVEF, however with severe MR most probably   overestimated.   Mitral leaflets are thickened and mildly calcified. There is   severe mitral regurgitation (RV = 67ml, ERO 50 mm2).   Moderate aortic regurgitation.   Moderately dilated RV with normal RVEF. Severe pulmonary   hypertension.  Echo 10/30/16: Study Conclusions  - Left ventricle: Diffuse hypokinesis abnormal septal motion   Systolic function was mildly to moderately reduced. The estimated   ejection fraction was in the range of 40% to 45%. Diffuse   hypokinesis. The study is not technically sufficient to allow   evaluation of LV diastolic  function. - Aortic valve: There was mild regurgitation. - Mitral valve: Normal appearing bioprosthetic MVR. No peri   valvular regurgitations  Echo 01/31/17: Study Conclusions  - Left ventricle: Diffuse hypokinesis worse in the septum. The   cavity size was moderately dilated. Wall thickness was increased   in a pattern of mild LVH. Systolic function was moderately to   severely reduced. The estimated ejection fraction was in the   range of 30%  to 35%. - Aortic valve: There was moderate regurgitation. - Mitral valve: Normal appearing bioprosthetic mitral valve with no   peri valvular regurgitation. - Left atrium: The atrium was mildly dilated. - Right ventricle: The cavity size was moderately dilated. - Right atrium: The atrium was mildly dilated. - Atrial septum: No defect or patent foramen ovale was identified. - Tricuspid valve: Post repair with ring.   ASSESSMENT:    1. Chronic combined systolic and diastolic congestive heart failure ([place])   2. S/P mitral valve replacement with bioprosthetic valve   3. S/P tricuspid valve repair   4. Complete AV block ([place])   5. Severe mitral regurgitation      PLAN:  In order of problems listed above:   1. Severe MR: Moderate TR. Now s/p MVR with a bovine prosthesis and TV repair on 10/25/16 by Dr. Roxy Manns. Good result by Echo.   2. Chronic systolic CHF. Last EF 35% in June.  Continue lasix, Imdur  and Coreg. Not a candidate for ARB/ACEi due to CKD. Will add hydralazine 25 mg tid.   3. Hypertension: His blood pressure is well controlled  4. HLD: Continue on Lipitor 40 mg daily. Excellent control.   5. Atrial flutter post op valve surgery. Now converted to NSR on amiodarone. In NSR since May. Will stop amiodarone at this time and monitor for recurrence on pacer checks.   6. CHB s/p BiV pacemaker  7. CKD stage 3  8. Chronic anticoagulation.    Medication Adjustments/Labs and Tests Ordered: Current medicines are reviewed at length  with the patient today.  Concerns regarding medicines are outlined above.  Medication changes, Labs and Tests ordered today are listed in the Patient Instructions below. Patient Instructions  Stop taking amiodarone.   Continue your other medication  We will add hydralazine 25 mg three times a day  I will see you in 4 months.      Signed, Alaylah Heatherington Martinique, MD  03/01/2017 10:45 AM    [address], [place], [place]  [address] Phone: [phone]; Fax: [phone]

## 2017-03-01 ENCOUNTER — Ambulatory Visit (INDEPENDENT_AMBULATORY_CARE_PROVIDER_SITE_OTHER): Payer: Medicare HMO | Admitting: Cardiology

## 2017-03-01 ENCOUNTER — Encounter: Payer: Self-pay | Admitting: Cardiology

## 2017-03-01 ENCOUNTER — Other Ambulatory Visit: Payer: Self-pay | Admitting: Cardiology

## 2017-03-01 ENCOUNTER — Ambulatory Visit (INDEPENDENT_AMBULATORY_CARE_PROVIDER_SITE_OTHER): Payer: Medicare HMO | Admitting: Pharmacist

## 2017-03-01 VITALS — BP 124/88 | HR 76 | Ht 71.0 in | Wt 150.0 lb

## 2017-03-01 DIAGNOSIS — Z953 Presence of xenogenic heart valve: Secondary | ICD-10-CM

## 2017-03-01 DIAGNOSIS — Z7901 Long term (current) use of anticoagulants: Secondary | ICD-10-CM | POA: Diagnosis not present

## 2017-03-01 DIAGNOSIS — Z9889 Other specified postprocedural states: Secondary | ICD-10-CM

## 2017-03-01 DIAGNOSIS — I340 Nonrheumatic mitral (valve) insufficiency: Secondary | ICD-10-CM

## 2017-03-01 DIAGNOSIS — I442 Atrioventricular block, complete: Secondary | ICD-10-CM

## 2017-03-01 DIAGNOSIS — I5042 Chronic combined systolic (congestive) and diastolic (congestive) heart failure: Secondary | ICD-10-CM | POA: Diagnosis not present

## 2017-03-01 DIAGNOSIS — Z5181 Encounter for therapeutic drug level monitoring: Secondary | ICD-10-CM

## 2017-03-01 LAB — POCT INR: INR: 2.2

## 2017-03-01 MED ORDER — HYDRALAZINE HCL 25 MG PO TABS: 25.0000 mg | ORAL_TABLET | Freq: Three times a day (TID) | ORAL | 3 refills | Status: AC

## 2017-03-01 NOTE — Patient Instructions (Signed)
Stop taking amiodarone.   Continue your other medication  We will add hydralazine 25 mg three times a day  I will see you in 4 months.

## 2017-03-08 ENCOUNTER — Telehealth: Payer: Self-pay | Admitting: Cardiology

## 2017-03-08 NOTE — Telephone Encounter (Signed)
Patient called back and informed of new instructions:  Hold hydralazine for 2 days and take benadryl 25mg  if needed.  Re-challenge in 3 days if itching resolved.  Patient verbalized his understanding and was instructed to call back with the results.

## 2017-03-08 NOTE — Telephone Encounter (Signed)
Returned the phone call to the patient. He stated that since he has started Hydralazine 25 mg tid that he has developed itching all over. He denies swelling or shortness of breath. He has been on the medication since 03/01/17. Will route to the provider and pharmacy for further recommendation.

## 2017-03-08 NOTE — Telephone Encounter (Signed)
Patient calling, states that he has been itching ever since he took the hydralazine medication. Thanks.

## 2017-03-08 NOTE — Telephone Encounter (Signed)
Hold hydralazine for 2 days and take benadryl 25mg  if needed.  Re-challenge in 3 days if itching resolved.

## 2017-03-08 NOTE — Telephone Encounter (Signed)
Agree  Zissy Hamlett MD, FACC   

## 2017-03-12 NOTE — Addendum Note (Signed)
Addendum  created 03/12/17 1712 by Herberta Pickron, MD   Sign clinical note    

## 2017-03-23 ENCOUNTER — Ambulatory Visit (INDEPENDENT_AMBULATORY_CARE_PROVIDER_SITE_OTHER): Payer: Medicare HMO | Admitting: Pharmacist

## 2017-03-23 DIAGNOSIS — Z7901 Long term (current) use of anticoagulants: Secondary | ICD-10-CM | POA: Diagnosis not present

## 2017-03-23 DIAGNOSIS — Z9889 Other specified postprocedural states: Secondary | ICD-10-CM | POA: Diagnosis not present

## 2017-03-23 DIAGNOSIS — Z953 Presence of xenogenic heart valve: Secondary | ICD-10-CM

## 2017-03-23 DIAGNOSIS — Z5181 Encounter for therapeutic drug level monitoring: Secondary | ICD-10-CM

## 2017-03-23 LAB — POCT INR: INR: 2.8

## 2017-04-09 ENCOUNTER — Ambulatory Visit: Payer: Medicare HMO | Admitting: Cardiology

## 2017-04-20 ENCOUNTER — Ambulatory Visit (INDEPENDENT_AMBULATORY_CARE_PROVIDER_SITE_OTHER): Payer: Medicare HMO | Admitting: Pharmacist Clinician (PhC)/ Clinical Pharmacy Specialist

## 2017-04-20 DIAGNOSIS — Z7901 Long term (current) use of anticoagulants: Secondary | ICD-10-CM

## 2017-04-20 DIAGNOSIS — Z953 Presence of xenogenic heart valve: Secondary | ICD-10-CM | POA: Diagnosis not present

## 2017-04-20 DIAGNOSIS — Z5181 Encounter for therapeutic drug level monitoring: Secondary | ICD-10-CM

## 2017-04-20 DIAGNOSIS — Z9889 Other specified postprocedural states: Secondary | ICD-10-CM | POA: Diagnosis not present

## 2017-04-20 LAB — POCT INR: INR: 2.4

## 2017-05-07 ENCOUNTER — Telehealth: Payer: Self-pay | Admitting: Cardiology

## 2017-05-07 ENCOUNTER — Ambulatory Visit (INDEPENDENT_AMBULATORY_CARE_PROVIDER_SITE_OTHER): Payer: Medicare Other | Admitting: *Deleted

## 2017-05-07 DIAGNOSIS — I442 Atrioventricular block, complete: Secondary | ICD-10-CM | POA: Diagnosis not present

## 2017-05-07 NOTE — Telephone Encounter (Signed)
Spoke with pt and reminded pt of remote transmission that is due today. Pt verbalized understanding.   

## 2017-05-11 NOTE — Progress Notes (Signed)
Remote pacemaker transmission.   

## 2017-05-16 LAB — CUP PACEART REMOTE DEVICE CHECK
Battery Remaining Longevity: 77 mo
Battery Remaining Percentage: 95.5 %
Battery Voltage: 2.99 V
Brady Statistic AP VP Percent: 18 %
Brady Statistic AS VS Percent: 1 %
Implantable Lead Implant Date: 20180309
Implantable Lead Implant Date: 20180309
Implantable Lead Location: 753859
Implantable Lead Location: 753860
Implantable Lead Model: 3830
Implantable Pulse Generator Implant Date: 20180309
Lead Channel Impedance Value: 540 Ohm
Lead Channel Pacing Threshold Amplitude: 0.5 V
Lead Channel Pacing Threshold Amplitude: 0.75 V
Lead Channel Pacing Threshold Pulse Width: 0.5 ms
Lead Channel Pacing Threshold Pulse Width: 0.5 ms
Lead Channel Pacing Threshold Pulse Width: 1 ms
Lead Channel Sensing Intrinsic Amplitude: 2.5 mV
Lead Channel Setting Pacing Amplitude: 0.25 V
Lead Channel Setting Pacing Amplitude: 2.5 V
Lead Channel Setting Pacing Pulse Width: 0.05 ms
MDC IDC LEAD IMPLANT DT: 20180309
MDC IDC LEAD LOCATION: 753860
MDC IDC MSMT LEADCHNL LV IMPEDANCE VALUE: 460 Ohm
MDC IDC MSMT LEADCHNL RA PACING THRESHOLD AMPLITUDE: 0.75 V
MDC IDC MSMT LEADCHNL RV IMPEDANCE VALUE: 490 Ohm
MDC IDC MSMT LEADCHNL RV SENSING INTR AMPL: 6 mV
MDC IDC SESS DTM: 20180913112546
MDC IDC SET LEADCHNL LV PACING PULSEWIDTH: 1 ms
MDC IDC SET LEADCHNL RA PACING AMPLITUDE: 2 V
MDC IDC SET LEADCHNL RV SENSING SENSITIVITY: 2 mV
MDC IDC STAT BRADY AP VS PERCENT: 1 %
MDC IDC STAT BRADY AS VP PERCENT: 81 %
MDC IDC STAT BRADY RA PERCENT PACED: 18 %
Pulse Gen Model: 3222
Pulse Gen Serial Number: 8006947

## 2017-05-18 ENCOUNTER — Encounter: Payer: Self-pay | Admitting: Cardiology

## 2017-05-18 ENCOUNTER — Ambulatory Visit (INDEPENDENT_AMBULATORY_CARE_PROVIDER_SITE_OTHER): Payer: Medicare Other | Admitting: Pharmacist

## 2017-05-18 DIAGNOSIS — Z5181 Encounter for therapeutic drug level monitoring: Secondary | ICD-10-CM | POA: Diagnosis not present

## 2017-05-18 DIAGNOSIS — Z7901 Long term (current) use of anticoagulants: Secondary | ICD-10-CM | POA: Diagnosis not present

## 2017-05-18 DIAGNOSIS — Z9889 Other specified postprocedural states: Secondary | ICD-10-CM | POA: Diagnosis not present

## 2017-05-18 DIAGNOSIS — Z953 Presence of xenogenic heart valve: Secondary | ICD-10-CM | POA: Diagnosis not present

## 2017-05-18 LAB — POCT INR: INR: 1.5

## 2017-05-31 ENCOUNTER — Other Ambulatory Visit: Payer: Self-pay | Admitting: Cardiology

## 2017-06-04 ENCOUNTER — Ambulatory Visit (INDEPENDENT_AMBULATORY_CARE_PROVIDER_SITE_OTHER): Payer: Medicare Other | Admitting: Pharmacist Clinician (PhC)/ Clinical Pharmacy Specialist

## 2017-06-04 DIAGNOSIS — Z953 Presence of xenogenic heart valve: Secondary | ICD-10-CM

## 2017-06-04 DIAGNOSIS — Z5181 Encounter for therapeutic drug level monitoring: Secondary | ICD-10-CM

## 2017-06-04 DIAGNOSIS — Z9889 Other specified postprocedural states: Secondary | ICD-10-CM

## 2017-06-04 DIAGNOSIS — Z7901 Long term (current) use of anticoagulants: Secondary | ICD-10-CM

## 2017-06-04 LAB — POCT INR: INR: 1.9

## 2017-06-18 ENCOUNTER — Other Ambulatory Visit: Payer: Self-pay | Admitting: Physician Assistant

## 2017-06-18 NOTE — Telephone Encounter (Signed)
Please review for refill. Thanks!  

## 2017-06-25 ENCOUNTER — Other Ambulatory Visit: Payer: Self-pay | Admitting: Physician Assistant

## 2017-06-26 NOTE — Telephone Encounter (Signed)
Please review for refill, Thanks !  

## 2017-06-26 NOTE — Telephone Encounter (Signed)
Rx(s) sent to pharmacy electronically.  

## 2017-06-27 ENCOUNTER — Other Ambulatory Visit: Payer: Self-pay

## 2017-06-27 ENCOUNTER — Other Ambulatory Visit: Payer: Self-pay | Admitting: *Deleted

## 2017-06-27 NOTE — Progress Notes (Signed)
Cardiology Office Note    Date:  07/03/2017   ID:  Aaron Bennett, DOB 04/17/1951, MRN [id number]  PCP:  Patient, No Pcp Per  Cardiologist:  Dr. Melven Stockard Martinique  Chief Complaint  Patient presents with  . Atrial Flutter    History of Present Illness:  Aaron Bennett is a 66 y.o. male with PMH of HTN, HLD, prostate CA, stage III CKD, and h/o alcohol abuse. He has a history of valvular heart disease. He was hospitalized for CHF exacerbation from 8/20-8/23/2017 at [facility]. Echocardiogram obtained during that admission showed reduced ejection fraction of 30-35% with severe MR and TR. 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 He did have alcohol withdrawal with seizure during that admission. He was not prescribed diuretic at the time of discharge due to elevated creatinine. He went to stay with his nephew in [place] area after discharge. Patient became short of breath and came to the emergency room at [facility] on 04/26/2016 with congestive heart failure. He was admitted and diuresed. A repeat echo obtained on 04/27/2016 showed improved ejection fraction of 50-55% with severe MR. After a period of time due to social factors he underwent TEE in January 2018 showing severe MR, moderate TR, RV dysfunction. LV EF 50-55%. Right and left heart cath performed showing no significant CAD. Large V waves c/w MR. Moderate to severe pulmonary HTN. On 10/31/16 he underwent minimally invasive MVR with a #31 Edwards Magna Bovine prosthesis and tricuspid valve repair with annuloplasty by Dr. Roxy Manns.  Post op he developed CHB as well as atrial flutter. He had a BiV pacemaker placed by EP.  Repeat Echo post op showed EF 40-45% with good prosthetic valve function. He was anticoagulated with coumadin. Repeat Echo in June showed drop in EF to 35% again. Imdur was added.   Seen in follow up on November 14, 2016. He was still in atrial flutter with controlled rate. On follow  up in May and when seen in device clinic on 02/02/17 by Dr. Lovena Le he was back in NSR. On last pacemaker check in September only had one episode of SVT last 1:32.   On follow up today he states he feels very well. No chest pain or SOB. No edema. Denies any palpitations. States he is walking a lot.  No dizziness or syncope.  No bleeding on coumadin. He is tolerating medication well. He only notes slight irritation in right chest from incision.   Past Medical History:  Diagnosis Date  . CHF (congestive heart failure) ([place])    a. 04/2016: echo with EF of 50-55%, previously reported 30-35% by outside records.  . Chronic combined systolic and diastolic congestive heart failure ([place])   . CKD (chronic kidney disease) stage 3, GFR 30-59 ml/min (HCC)   . H/O ETOH abuse   . Hypertension   . Mitral regurgitation    a. severe by echo in 04/2016  . Prostate cancer ([place])   . 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 APPENDECTOMY    . PROSTATE SURGERY      Current Medications: Outpatient Medications Prior to Visit  Medication Sig Dispense Refill  . aspirin 81 MG EC tablet TAKE 1 TABLET BY MOUTH DAILY 30 tablet 3  . carvedilol (COREG) 6.25 MG tablet TAKE 1 TABLET (6.25 MG  TOTAL) BY MOUTH 2 (TWO) TIMES DAILY WITH A MEAL. 60 tablet 8  . [facility]S B-1 100 MG tablet TAKE 1 TABLET BY MOUTH DAILY 30 tablet 9  . folic acid (FOLVITE) 1 MG tablet TAKE 1 TABLET BY MOUTH DAILY 30 tablet 6  . furosemide (LASIX) 40 MG tablet TAKE 1 TABLET BY MOUTH DAILY 30 tablet 0  . hydrALAZINE (APRESOLINE) 25 MG tablet Take 1 tablet (25 mg total) by mouth 3 (three) times daily. 270 tablet 3  . Multiple Vitamin (MULTIVITAMIN) tablet Take 1 tablet by mouth daily.    . potassium chloride SA (K-DUR,KLOR-CON) 20 MEQ tablet Take 1 tablet  (20 mEq total) by mouth daily. 30 tablet 12  . warfarin (COUMADIN) 5 MG tablet Take 1/2 to 1 tablet daily as directed by coumadin clinic 30 tablet 3  . isosorbide mononitrate (IMDUR) 30 MG 24 hr tablet Take 1 tablet (30 mg total) by mouth daily. 30 tablet 6   No facility-administered medications prior to visit.      Allergies:   No known allergies   Social History   Socioeconomic History  . Marital status: Single    Spouse name: None  . Number of children: None  . Years of education: None  . Highest education level: None  Social Needs  . Financial resource strain: None  . Food insecurity - worry: None  . Food insecurity - inability: None  . Transportation needs - medical: None  . Transportation needs - non-medical: None  Occupational History  . None  Tobacco Use  . Smoking status: Former Smoker    Packs/day: 1.00    Years: 50.00    Pack years: 50.00    Last attempt to quit: 04/16/2016    Years since quitting: 1.2  . Smokeless tobacco: Never Used  Substance and Sexual Activity  . Alcohol use: No    Comment: no alcohol since 03/2016  . Drug use: No  . Sexual activity: None  Other Topics Concern  . None  Social History Narrative  . None     Family History:  The patient's family history includes Lung disease in his mother; Stroke (age of onset: 65) in his father; Yves Dill Parkinson White syndrome in his brother.   ROS:   Please see the history of present illness.    ROS All other systems reviewed and are negative.   PHYSICAL EXAM:   VS:  BP 122/84   Pulse 86   Ht 5\' 11"  (1.803 m)   Wt 150 lb 3.2 oz (68.1 kg)   BMI 20.95 kg/m    GENERAL:  Well appearing, thin BM in NAD HEENT:  PERRL, EOMI, sclera are clear. Oropharynx is clear. NECK:  No jugular venous distention, carotid upstroke brisk and symmetric, no bruits, no thyromegaly or adenopathy LUNGS:  Clear to auscultation bilaterally CHEST:  Unremarkable HEART:  RRR,  PMI not displaced or sustained,S1 and S2 within  normal limits, no S3, no S4: no clicks, no rubs, no murmurs ABD:  Soft, nontender. BS +, no masses or bruits. No hepatomegaly, no splenomegaly EXT:  2 + pulses throughout, no edema, no cyanosis no clubbing SKIN:  Warm and dry.  No rashes NEURO:  Alert and oriented x 3. Cranial nerves II through XII intact. PSYCH:  Cognitively intact    Wt Readings from Last 3 Encounters:  07/03/17 150 lb 3.2 oz (68.1 kg)  03/01/17 150 lb (68 kg)  02/02/17 152 lb 3.2 oz (69 kg)      Studies/Labs  Reviewed:   EKG:  EKG is not ordered today.    Recent Labs: 10/26/2016: Magnesium 1.8 12/28/2016: ALT 7; BUN 20; Creat 1.48; Hemoglobin 10.9; Platelets 290; Potassium 5.3; Sodium 139; TSH 2.10   Lipid Panel    Component Value Date/Time   CHOL 146 12/28/2016 1018   TRIG 82 12/28/2016 1018   HDL 62 12/28/2016 1018   CHOLHDL 2.4 12/28/2016 1018   VLDL 16 12/28/2016 1018   LDLCALC 68 12/28/2016 1018    Additional studies/ records that were reviewed today include:   Echo 04/27/2016 LV EF: 50% -   55%  ------------------------------------------------------------------- Indications:      CHF - 428.0.  ------------------------------------------------------------------- History:   Risk factors:  History of alcohol abuse. Hypertension.   ------------------------------------------------------------------- Study Conclusions  - Left ventricle: The cavity size was normal. There was moderate   focal basal and moderate concentric hypertrophy of the left   ventricle. Systolic function was normal. The estimated ejection   fraction was in the range of 50% to 55%. Wall motion was normal;   there were no regional wall motion abnormalities. - Ventricular septum: The contour showed diastolic flattening and   systolic flattening. - Aortic valve: There was moderate regurgitation. - Mitral valve: There was severe regurgitation directed centrally   and posteriorly. Valve area by continuity equation (using LVOT    flow): 1.11 cm^2. - Left atrium: The atrium was moderately dilated. - Right ventricle: The cavity size was moderately dilated. Wall   thickness was normal. - Right atrium: The atrium was severely dilated. - Tricuspid valve: There was moderate regurgitation. - Pulmonary arteries: Systolic pressure was severely increased. PA   peak pressure: 71 mm Hg (S).  Impressions:  - Low normal LVEF, however with severe MR most probably   overestimated.   Mitral leaflets are thickened and mildly calcified. There is   severe mitral regurgitation (RV = 63ml, ERO 50 mm2).   Moderate aortic regurgitation.   Moderately dilated RV with normal RVEF. Severe pulmonary   hypertension.  Echo 10/30/16: Study Conclusions  - Left ventricle: Diffuse hypokinesis abnormal septal motion   Systolic function was mildly to moderately reduced. The estimated   ejection fraction was in the range of 40% to 45%. Diffuse   hypokinesis. The study is not technically sufficient to allow   evaluation of LV diastolic function. - Aortic valve: There was mild regurgitation. - Mitral valve: Normal appearing bioprosthetic MVR. No peri   valvular regurgitations  Echo 01/31/17: Study Conclusions  - Left ventricle: Diffuse hypokinesis worse in the septum. The   cavity size was moderately dilated. Wall thickness was increased   in a pattern of mild LVH. Systolic function was moderately to   severely reduced. The estimated ejection fraction was in the   range of 30% to 35%. - Aortic valve: There was moderate regurgitation. - Mitral valve: Normal appearing bioprosthetic mitral valve with no   peri valvular regurgitation. - Left atrium: The atrium was mildly dilated. - Right ventricle: The cavity size was moderately dilated. - Right atrium: The atrium was mildly dilated. - Atrial septum: No defect or patent foramen ovale was identified. - Tricuspid valve: Post repair with ring.   ASSESSMENT:    1. Chronic combined systolic  and diastolic congestive heart failure ([place])   2. S/P mitral valve replacement with bioprosthetic valve   3. S/P tricuspid valve repair   4. Complete AV block ([place])   5. Biventricular cardiac pacemaker in situ      PLAN:  In order of problems listed above:   1. Severe MR: Moderate TR. Now s/p MVR with a bovine prosthesis and TV repair on 10/25/16 by Dr. Roxy Manns. Good result by Echo. He is now asymptomatic.   2. Chronic systolic CHF. Last EF 35% in June.  Continue lasix, Imdur  and Coreg. Not a candidate for ARB/ACEi due to CKD. Also now on  hydralazine 25 mg tid.   3. Hypertension: His blood pressure is well controlled  4. HLD: Continue on Lipitor 40 mg daily. Excellent control.   5. Atrial flutter post op valve surgery. Now converted to NSR. Amiodarone discontinued without recurrent sustained flutter.  6. CHB s/p BiV pacemaker  7. CKD stage 3  8. Chronic anticoagulation. INR 2.7.     Medication Adjustments/Labs and Tests Ordered: Current medicines are reviewed at length with the patient today.  Concerns regarding medicines are outlined above.  Medication changes, Labs and Tests ordered today are listed in the Patient Instructions below. Patient Instructions  Continue your current therapy  I will see you in 6 months.    Signed, Annaleah Arata Martinique, MD  07/03/2017 11:21 AM    [facility] [place], [place], [place]  [address] Phone: [phone]; Fax: [phone]

## 2017-06-30 ENCOUNTER — Other Ambulatory Visit: Payer: Self-pay | Admitting: Cardiology

## 2017-06-30 ENCOUNTER — Other Ambulatory Visit: Payer: Self-pay | Admitting: Physician Assistant

## 2017-07-02 NOTE — Telephone Encounter (Signed)
Please review for refill, thanks ! 

## 2017-07-02 NOTE — Telephone Encounter (Signed)
Close this encounter

## 2017-07-03 ENCOUNTER — Encounter: Payer: Self-pay | Admitting: Cardiology

## 2017-07-03 ENCOUNTER — Ambulatory Visit (INDEPENDENT_AMBULATORY_CARE_PROVIDER_SITE_OTHER): Payer: Medicare Other | Admitting: Pharmacist Clinician (PhC)/ Clinical Pharmacy Specialist

## 2017-07-03 ENCOUNTER — Ambulatory Visit (INDEPENDENT_AMBULATORY_CARE_PROVIDER_SITE_OTHER): Payer: Medicare Other | Admitting: Cardiology

## 2017-07-03 VITALS — BP 122/84 | HR 86 | Ht 71.0 in | Wt 150.2 lb

## 2017-07-03 DIAGNOSIS — Z9889 Other specified postprocedural states: Secondary | ICD-10-CM

## 2017-07-03 DIAGNOSIS — I5042 Chronic combined systolic (congestive) and diastolic (congestive) heart failure: Secondary | ICD-10-CM | POA: Diagnosis not present

## 2017-07-03 DIAGNOSIS — I4892 Unspecified atrial flutter: Secondary | ICD-10-CM

## 2017-07-03 DIAGNOSIS — Z950 Presence of cardiac pacemaker: Secondary | ICD-10-CM | POA: Diagnosis not present

## 2017-07-03 DIAGNOSIS — Z5181 Encounter for therapeutic drug level monitoring: Secondary | ICD-10-CM

## 2017-07-03 DIAGNOSIS — I442 Atrioventricular block, complete: Secondary | ICD-10-CM | POA: Diagnosis not present

## 2017-07-03 DIAGNOSIS — Z953 Presence of xenogenic heart valve: Secondary | ICD-10-CM

## 2017-07-03 DIAGNOSIS — Z7901 Long term (current) use of anticoagulants: Secondary | ICD-10-CM

## 2017-07-03 LAB — POCT INR: INR: 2.7

## 2017-07-03 NOTE — Patient Instructions (Signed)
Continue your current therapy  I will see you in 6 months.   

## 2017-07-08 ENCOUNTER — Other Ambulatory Visit: Payer: Self-pay | Admitting: Physician Assistant

## 2017-07-09 NOTE — Telephone Encounter (Signed)
Please review for refill, Thanks !  

## 2017-07-30 ENCOUNTER — Other Ambulatory Visit: Payer: Self-pay | Admitting: Cardiology

## 2017-08-01 ENCOUNTER — Ambulatory Visit (INDEPENDENT_AMBULATORY_CARE_PROVIDER_SITE_OTHER): Payer: Medicare Other | Admitting: Pharmacist

## 2017-08-01 DIAGNOSIS — Z9889 Other specified postprocedural states: Secondary | ICD-10-CM

## 2017-08-01 DIAGNOSIS — Z5181 Encounter for therapeutic drug level monitoring: Secondary | ICD-10-CM | POA: Diagnosis not present

## 2017-08-01 DIAGNOSIS — Z7901 Long term (current) use of anticoagulants: Secondary | ICD-10-CM | POA: Diagnosis not present

## 2017-08-01 DIAGNOSIS — Z953 Presence of xenogenic heart valve: Secondary | ICD-10-CM

## 2017-08-01 LAB — POCT INR: INR: 2.9

## 2017-08-08 ENCOUNTER — Telehealth: Payer: Self-pay | Admitting: *Deleted

## 2017-08-08 NOTE — Telephone Encounter (Signed)
Which pharmacy?

## 2017-08-09 ENCOUNTER — Ambulatory Visit (INDEPENDENT_AMBULATORY_CARE_PROVIDER_SITE_OTHER): Payer: Medicare Other | Admitting: *Deleted

## 2017-08-09 DIAGNOSIS — I442 Atrioventricular block, complete: Secondary | ICD-10-CM | POA: Diagnosis not present

## 2017-08-09 NOTE — Progress Notes (Signed)
Remote pacemaker transmission.   

## 2017-08-10 MED ORDER — WARFARIN SODIUM 5 MG PO TABS: ORAL_TABLET | ORAL | 0 refills | Status: AC

## 2017-08-10 NOTE — Addendum Note (Signed)
Addended by: Rockne Menghini on: 08/10/2017 07:40 AM   Modules accepted: Orders

## 2017-08-14 ENCOUNTER — Encounter: Payer: Self-pay | Admitting: Cardiology

## 2017-08-28 ENCOUNTER — Other Ambulatory Visit: Payer: Self-pay | Admitting: Cardiology

## 2017-09-07 LAB — CUP PACEART REMOTE DEVICE CHECK
Battery Remaining Percentage: 95.5 %
Brady Statistic AP VP Percent: 11 %
Brady Statistic AP VS Percent: 1 %
Brady Statistic AS VP Percent: 89 %
Brady Statistic AS VS Percent: 1 %
Implantable Lead Implant Date: 20180309
Implantable Lead Implant Date: 20180309
Implantable Lead Location: 753859
Lead Channel Impedance Value: 480 Ohm
Lead Channel Pacing Threshold Amplitude: 0.5 V
Lead Channel Pacing Threshold Amplitude: 0.75 V
Lead Channel Pacing Threshold Pulse Width: 0.5 ms
Lead Channel Pacing Threshold Pulse Width: 1 ms
Lead Channel Sensing Intrinsic Amplitude: 4 mV
Lead Channel Sensing Intrinsic Amplitude: 7.7 mV
Lead Channel Setting Pacing Amplitude: 0.25 V
Lead Channel Setting Pacing Amplitude: 2 V
Lead Channel Setting Sensing Sensitivity: 2 mV
MDC IDC LEAD IMPLANT DT: 20180309
MDC IDC LEAD LOCATION: 753860
MDC IDC LEAD LOCATION: 753860
MDC IDC MSMT BATTERY REMAINING LONGEVITY: 81 mo
MDC IDC MSMT BATTERY VOLTAGE: 2.99 V
MDC IDC MSMT LEADCHNL LV IMPEDANCE VALUE: 450 Ohm
MDC IDC MSMT LEADCHNL RA IMPEDANCE VALUE: 550 Ohm
MDC IDC MSMT LEADCHNL RA PACING THRESHOLD AMPLITUDE: 0.75 V
MDC IDC MSMT LEADCHNL RA PACING THRESHOLD PULSEWIDTH: 0.5 ms
MDC IDC PG IMPLANT DT: 20180309
MDC IDC PG SERIAL: 8006947
MDC IDC SESS DTM: 20181213100617
MDC IDC SET LEADCHNL LV PACING AMPLITUDE: 2.5 V
MDC IDC SET LEADCHNL LV PACING PULSEWIDTH: 1 ms
MDC IDC SET LEADCHNL RV PACING PULSEWIDTH: 0.05 ms
MDC IDC STAT BRADY RA PERCENT PACED: 11 %

## 2017-09-12 ENCOUNTER — Telehealth: Payer: Self-pay | Admitting: Cardiology

## 2017-09-12 NOTE — Telephone Encounter (Signed)
Aaron Bennett is calling to see if he can get any pain medication . He is having pain is running through his right breast to the middle of his chest .. Please call

## 2017-09-13 NOTE — Telephone Encounter (Signed)
Returned call to patient he stated ever since he had heart surgery 10/2016 he has a pain in right chest that radiates into center of chest.Stated he has pain every day.He has appointment with Dr.Owen 10/29/17.He has a INR appointment 09/18/17 at 10:30 am.Appointment scheduled with Jory Sims DNP 09/18/17 at 11:30am.

## 2017-09-13 NOTE — Telephone Encounter (Signed)
Received a call from patient.Advised we spoke earlier.Stated he would like something for pain in right chest, he has had pain since his surgery 10/2016.Stated he is waiting on a call back form Dr.Owen's office.Advised to try taking Tylenol for pain in right chest.Advised I will make Dr.Jordan aware.

## 2017-09-18 ENCOUNTER — Encounter: Payer: Self-pay | Admitting: Adult Health

## 2017-09-18 ENCOUNTER — Ambulatory Visit (INDEPENDENT_AMBULATORY_CARE_PROVIDER_SITE_OTHER): Payer: Medicare Other | Admitting: Pharmacist Clinician (PhC)/ Clinical Pharmacy Specialist

## 2017-09-18 ENCOUNTER — Ambulatory Visit (INDEPENDENT_AMBULATORY_CARE_PROVIDER_SITE_OTHER): Payer: Medicare Other | Admitting: Adult Health

## 2017-09-18 VITALS — BP 142/82 | HR 80 | Ht 71.0 in | Wt 146.0 lb

## 2017-09-18 DIAGNOSIS — Z7901 Long term (current) use of anticoagulants: Secondary | ICD-10-CM | POA: Diagnosis not present

## 2017-09-18 DIAGNOSIS — Z5181 Encounter for therapeutic drug level monitoring: Secondary | ICD-10-CM | POA: Diagnosis not present

## 2017-09-18 DIAGNOSIS — Z9889 Other specified postprocedural states: Secondary | ICD-10-CM

## 2017-09-18 DIAGNOSIS — I10 Essential (primary) hypertension: Secondary | ICD-10-CM

## 2017-09-18 DIAGNOSIS — Z79899 Other long term (current) drug therapy: Secondary | ICD-10-CM

## 2017-09-18 DIAGNOSIS — I5042 Chronic combined systolic (congestive) and diastolic (congestive) heart failure: Secondary | ICD-10-CM | POA: Diagnosis not present

## 2017-09-18 DIAGNOSIS — Z953 Presence of xenogenic heart valve: Secondary | ICD-10-CM | POA: Diagnosis not present

## 2017-09-18 DIAGNOSIS — M792 Neuralgia and neuritis, unspecified: Secondary | ICD-10-CM

## 2017-09-18 DIAGNOSIS — I2510 Atherosclerotic heart disease of native coronary artery without angina pectoris: Secondary | ICD-10-CM

## 2017-09-18 DIAGNOSIS — Z952 Presence of prosthetic heart valve: Secondary | ICD-10-CM

## 2017-09-18 LAB — POCT INR: INR: 2.1

## 2017-09-18 MED ORDER — TRAMADOL HCL 50 MG PO TABS: 50.0000 mg | ORAL_TABLET | Freq: Two times a day (BID) | ORAL | 0 refills | Status: AC | PRN

## 2017-09-18 NOTE — Progress Notes (Signed)
Cardiology Office Note   Date:  09/18/2017   ID:  Aaron Bennett, DOB 1950/09/06, MRN [id number]  PCP:  Patient, No Pcp Per  Cardiologist: Dr. Peter Martinique Electrophysiologist : Dr. Lovena Le  Chief Complaint  Patient presents with  . Chest Pain     History of Present Illness: Aaron Bennett is a 67 y.o. male who presents for ongoing assessment and management of hypertension, hyperlipidemia, with other history to include valvular heart disease, mixed CHF with most recent echocardiogram completed in [facility] revealing an EF of 30% to 35% with severe MR and TR.  Other history includes alcoholic cardiomyopathy, prostate cancer, stage III chronic kidney disease.  Hospital on 04/26/2016 with congestive heart failure. He was admitted and diuresed. A repeat echo obtained on 04/27/2016 showed improved ejection fraction of 50-55% with severe MR. After a period of time due to social factors he underwent TEE in January 2018 showing severe MR, moderate TR, RV dysfunction. LV EF 50-55%.   Right and left heart cath performed showing no significant CAD. Large V waves c/w MR. Moderate to severe pulmonary HTN. On 10/31/16 he underwent minimally invasive MVR with a #31 Edwards Magna Bovine prosthesis and tricuspid valve repair with annuloplasty by Dr. Roxy Manns.   Post op he developed CHB as well as atrial flutter. He had a BiV pacemaker placed by EP.  Repeat Echo post op showed EF 40-45% with good prosthetic valve function. He was anticoagulated with coumadin. Repeat Echo in June showed drop in EF to 35% again. Imdur was added.  He is followed by Dr. Lovena Le for electrophysiology.  Most recent pacemaker check was completed on 08/09/2017 remotely.  He was last seen by Dr. Martinique on 07/03/2017 and was asymptomatic.  He was doing well medically compliant and continue to follow-up in the pacemaker clinic.  The patient called our office on 1 /17/2019 with complaints of pain over his right chest ever since he had heart  surgery in March 2018.  Stated he was feeling the pain every day.  The patient had was also to follow-up with Dr. Gertha Calkin March.  Patient comes today with complaints of right-sided chest pain where his incisions for chest tube were inserted, and has begun to worsen.  He states that the pain comes and goes, but does occasionally happen with movement of his torso.  He states is been going on for about a year but is becoming worse.  Patient has tried Tylenol.  He is avoided NSAIDs due to kidney disease.   Past Medical History:  Diagnosis Date  . CHF (congestive heart failure) ([place])    a. 04/2016: echo with EF of 50-55%, previously reported 30-35% by outside records.  . Chronic combined systolic and diastolic congestive heart failure ([place])   . CKD (chronic kidney disease) stage 3, GFR 30-59 ml/min (HCC)   . H/O ETOH abuse   . Hypertension   . Mitral regurgitation    a. severe by echo in 04/2016  . Prostate cancer ([place])   . S/P minimally invasive mitral valve replacement with bioprosthetic valve 10/25/2016   31 mm [facility] Mitral bovine bioprosthetic tissue valve placed via right mini thoracotomy approach    Past Surgical History:  Procedure Laterality Date  . APPENDECTOMY    . BIV PACEMAKER INSERTION CRT-P N/A 11/03/2016   Procedure: BiV Pacemaker Insertion CRT-P;  Surgeon: Evans Lance, MD;  Location: [place] CV LAB;  Service: Cardiovascular;  Laterality: N/A; [employer]  . CARDIAC CATHETERIZATION N/A 09/06/2016  Procedure: Right/Left Heart Cath and Coronary Angiography;  Surgeon: Peter M Martinique, MD;  Location: [place] CV LAB;  Service: Cardiovascular;  Laterality: N/A;  . MINIMALLY INVASIVE TRICUSPID VALVE REPAIR Right 10/25/2016   Procedure: MINIMALLY INVASIVE TRICUSPID VALVE REPAIR;  Surgeon: [name], MD;  Location: [place];  Service: Open Heart Surgery;  Laterality: Right;  . MITRAL VALVE REPLACEMENT Right 10/25/2016   Procedure: MINIMALLY INVASIVE MITRAL VALVE (MV)  REPLACEMENT;  Surgeon: [name], MD;  Location: [place];  Service: Open Heart Surgery;  Laterality: Right;  . PROSTATE SURGERY    . TEE WITHOUT CARDIOVERSION N/A 09/06/2016   Procedure: TRANSESOPHAGEAL ECHOCARDIOGRAM (TEE);  Surgeon: Thayer Headings, MD;  Location: [place];  Service: Cardiovascular;  Laterality: N/A;  . TEE WITHOUT CARDIOVERSION N/A 10/25/2016   Procedure: TRANSESOPHAGEAL ECHOCARDIOGRAM (TEE);  Surgeon: [name], MD;  Location: [place];  Service: Open Heart Surgery;  Laterality: N/A;     Current Outpatient Medications  Medication Sig Dispense Refill  . aspirin 81 MG EC tablet TAKE 1 TABLET BY MOUTH DAILY 30 tablet 3  . carvedilol (COREG) 6.25 MG tablet TAKE 1 TABLET (6.25 MG TOTAL) BY MOUTH 2 (TWO) TIMES DAILY WITH A MEAL. 60 tablet 8  . CVS B-1 100 MG tablet TAKE 1 TABLET BY MOUTH DAILY 30 tablet 9  . folic acid (FOLVITE) 1 MG tablet TAKE 1 TABLET BY MOUTH DAILY 30 tablet 6  . furosemide (LASIX) 40 MG tablet TAKE 1 TABLET BY MOUTH DAILY 30 tablet 0  . hydrALAZINE (APRESOLINE) 25 MG tablet Take 1 tablet (25 mg total) by mouth 3 (three) times daily. 270 tablet 3  . Multiple Vitamin (MULTIVITAMIN) tablet Take 1 tablet by mouth daily.    . potassium chloride SA (K-DUR,KLOR-CON) 20 MEQ tablet Take 1 tablet (20 mEq total) by mouth daily. 30 tablet 12  . warfarin (COUMADIN) 5 MG tablet Take 1/2 to 1 tablet daily as directed by coumadin clinic 90 tablet 0  . isosorbide mononitrate (IMDUR) 30 MG 24 hr tablet Take 1 tablet (30 mg total) by mouth daily. 30 tablet 6   No current facility-administered medications for this visit.     Allergies:   No known allergies    Social History:  The patient  reports that he quit smoking about 17 months ago. He has a 50.00 pack-year smoking history. he has never used smokeless tobacco. He reports that he does not drink alcohol or use drugs.   Family History:  The patient's family history includes Lung disease in his mother; Stroke  (age of onset: 43) in his father; Yves Dill Parkinson White syndrome in his brother.    ROS: All other systems are reviewed and negative. Unless otherwise mentioned in H&P    PHYSICAL EXAM: VS:  BP (!) 142/82   Pulse 80   Ht 5\' 11"  (1.803 m)   Wt 146 lb (66.2 kg)   BMI 20.36 kg/m  , BMI Body mass index is 20.36 kg/m. GEN: Well nourished, well developed, in no acute distress  HEENT: normal  Neck: no JVD, carotid bruits, or masses Cardiac:RRR; no murmurs, rubs, or gallops,no edema  Respiratory:  Clear to auscultation bilaterally, normal work of breathing GI: soft, nontender, nondistended, + BS MS: no deformity or atrophy pain with palpation of the right lateral chest, and with upper torso movement. Skin: warm and dry, no rash Neuro:  Strength and sensation are intact Psych: euthymic mood, full affect   EKG: Atrial sensed with ventricular  pacing, heart rate 80 bpm  Recent Labs: 10/26/2016: Magnesium 1.8 12/28/2016: ALT 7; BUN 20; Creat 1.48; Hemoglobin 10.9; Platelets 290; Potassium 5.3; Sodium 139; TSH 2.10    Lipid Panel    Component Value Date/Time   CHOL 146 12/28/2016 1018   TRIG 82 12/28/2016 1018   HDL 62 12/28/2016 1018   CHOLHDL 2.4 12/28/2016 1018   VLDL 16 12/28/2016 1018   LDLCALC 68 12/28/2016 1018      Wt Readings from Last 3 Encounters:  09/18/17 146 lb (66.2 kg)  07/03/17 150 lb 3.2 oz (68.1 kg)  03/01/17 150 lb (68 kg)      Other studies Reviewed:  Echocardiogram 01/31/2017 Left ventricle: Diffuse hypokinesis worse in the septum. The   cavity size was moderately dilated. Wall thickness was increased   in a pattern of mild LVH. Systolic function was moderately to   severely reduced. The estimated ejection fraction was in the   range of 30% to 35%. - Aortic valve: There was moderate regurgitation. - Mitral valve: Normal appearing bioprosthetic mitral valve with no   peri valvular regurgitation. - Left atrium: The atrium was mildly dilated. - Right  ventricle: The cavity size was moderately dilated. - Right atrium: The atrium was mildly dilated. - Atrial septum: No defect or patent foramen ovale was identified. - Tricuspid valve: Post repair with ring.  ASSESSMENT AND PLAN:  1.  Right-sided chest pain: Reproducible with palpation and movement of upper torso.  Located at the incision sites of the right lateral chest where it appeared there were previous chest tubes.  The patient states that the pain has worsened over the last several months.  This appears to be nerve pain likely from surgery and scar formation.  I am going to start him on tramadol 50 mg 1 p.o. twice daily.  If this is helpful to him he can follow up with his primary care or Dr. Roxy Manns for refills.  Cardiology will not be managing chronic pain.  BMET will be ordered due to chronic kidney disease and use of tramadol.  2.  Valvular heart disease: Patient is status post #31 Edwards Magna Bovine prosthesis and tricuspid valve repair with annuloplasty by Dr. Roxy Manns.  He remains on Coumadin with INR check today revealing level of 2.1.  He is followed in our Coumadin clinic at the [facility] office.  3. Mixed CHF with Cardiomyopathy: Chronic mixed CHF with both diastolic and systolic heart failure, reduced EF of 35% per most recent echocardiogram.  Patient does not have evidence of fluid overload.  Weight is stable, continues on furosemide 40 mg daily, carvedilol 6.25 mg twice daily. With reduced EF blood pressure is not optimal, he is in some pain.  May consider increasing hydralazine to 50 mg 3 times daily on next visit if blood pressure remains elevated.  BMET will be ordered first to evaluate current status.      Current medicines are reviewed at length with the patient today.    Labs/ tests ordered today include: BMET  Phill Myron. West Pugh, ANP, AACC   09/18/2017 11:43 AM    [facility] [address], [place], [place] [address] Phone: [phone];  Fax: [phone]

## 2017-09-18 NOTE — Patient Instructions (Signed)
Medication Instructions:  OK TO TAKE TRAMADOL TWICE DAILY WITH MEALS-FUTURE REFILLS WITH DR OWNS OR PCP  If you need a refill on your cardiac medications before your next appointment, please call your pharmacy.  Labwork: BMET TODAY HERE IN OUR OFFICE AT [facility]  Special Instructions: MAKE SURE TO DISCUSS PAIN WITH DR Ricard Dillon   Follow-Up: Your physician wants you to follow-up in: 3 MONTHS WITH DR Martinique -OR- KATHRYN LAWRENCE, [place].    Thank you for choosing [facility] at [facility]!!

## 2017-09-18 NOTE — Patient Instructions (Signed)
Description   Continue with 1 tablet each Monday, Wednesday and Friday, 1/2 tablet all other days.  Repeat INR in 6 weeks

## 2017-09-19 LAB — BASIC METABOLIC PANEL
BUN/Creatinine Ratio: 14 (ref 10–24)
BUN: 23 mg/dL (ref 8–27)
CO2: 26 mmol/L (ref 20–29)
Calcium: 9.6 mg/dL (ref 8.6–10.2)
Chloride: 93 mmol/L — ABNORMAL LOW (ref 96–106)
Creatinine, Ser: 1.67 mg/dL — ABNORMAL HIGH (ref 0.76–1.27)
GFR, EST AFRICAN AMERICAN: 49 mL/min/{1.73_m2} — AB (ref >59)
GFR, EST NON AFRICAN AMERICAN: 42 mL/min/{1.73_m2} — AB (ref >59)
Glucose: 88 mg/dL (ref 65–99)
POTASSIUM: 5 mmol/L (ref 3.5–5.2)
SODIUM: 135 mmol/L (ref 134–144)

## 2017-09-25 ENCOUNTER — Other Ambulatory Visit: Payer: Self-pay | Admitting: Cardiology

## 2017-09-25 ENCOUNTER — Ambulatory Visit: Payer: Medicare Other | Admitting: Adult Health

## 2017-09-29 ENCOUNTER — Other Ambulatory Visit: Payer: Self-pay | Admitting: Cardiology

## 2017-10-04 ENCOUNTER — Other Ambulatory Visit: Payer: Self-pay | Admitting: Cardiology

## 2017-10-04 NOTE — Telephone Encounter (Signed)
REFILL 

## 2017-10-29 ENCOUNTER — Encounter: Payer: Medicare HMO | Admitting: Thoracic Surgery (Cardiothoracic Vascular Surgery)

## 2017-10-30 ENCOUNTER — Ambulatory Visit (INDEPENDENT_AMBULATORY_CARE_PROVIDER_SITE_OTHER): Payer: Medicare Other | Admitting: Pharmacist

## 2017-10-30 DIAGNOSIS — Z9889 Other specified postprocedural states: Secondary | ICD-10-CM | POA: Diagnosis not present

## 2017-10-30 DIAGNOSIS — Z5181 Encounter for therapeutic drug level monitoring: Secondary | ICD-10-CM | POA: Diagnosis not present

## 2017-10-30 DIAGNOSIS — Z953 Presence of xenogenic heart valve: Secondary | ICD-10-CM

## 2017-10-30 DIAGNOSIS — Z7901 Long term (current) use of anticoagulants: Secondary | ICD-10-CM

## 2017-10-30 LAB — POCT INR: INR: 2.2

## 2017-11-08 ENCOUNTER — Ambulatory Visit (INDEPENDENT_AMBULATORY_CARE_PROVIDER_SITE_OTHER): Payer: Medicare Other | Admitting: *Deleted

## 2017-11-08 DIAGNOSIS — I442 Atrioventricular block, complete: Secondary | ICD-10-CM

## 2017-11-08 DIAGNOSIS — I5042 Chronic combined systolic (congestive) and diastolic (congestive) heart failure: Secondary | ICD-10-CM | POA: Diagnosis not present

## 2017-11-08 NOTE — Progress Notes (Signed)
Remote pacemaker transmission.   

## 2017-11-09 ENCOUNTER — Encounter: Payer: Self-pay | Admitting: Cardiology

## 2017-11-19 ENCOUNTER — Other Ambulatory Visit: Payer: Self-pay

## 2017-11-19 MED ORDER — ASPIRIN 81 MG PO TBEC: 81.0000 mg | DELAYED_RELEASE_TABLET | Freq: Every day | ORAL | 3 refills | Status: AC

## 2017-11-25 LAB — CUP PACEART REMOTE DEVICE CHECK
Battery Remaining Longevity: 79 mo
Battery Remaining Percentage: 95.5 %
Brady Statistic AP VP Percent: 7.7 %
Brady Statistic AP VS Percent: 1 %
Brady Statistic AS VP Percent: 92 %
Brady Statistic RA Percent Paced: 7.5 %
Implantable Lead Implant Date: 20180309
Implantable Lead Implant Date: 20180309
Implantable Lead Location: 753859
Implantable Lead Location: 753860
Implantable Lead Model: 3830
Lead Channel Impedance Value: 490 Ohm
Lead Channel Impedance Value: 510 Ohm
Lead Channel Pacing Threshold Amplitude: 0.5 V
Lead Channel Pacing Threshold Amplitude: 0.75 V
Lead Channel Pacing Threshold Pulse Width: 0.5 ms
Lead Channel Pacing Threshold Pulse Width: 0.5 ms
Lead Channel Pacing Threshold Pulse Width: 1 ms
Lead Channel Sensing Intrinsic Amplitude: 3.2 mV
Lead Channel Sensing Intrinsic Amplitude: 5.5 mV
Lead Channel Setting Pacing Amplitude: 2.5 V
Lead Channel Setting Pacing Pulse Width: 0.05 ms
Lead Channel Setting Sensing Sensitivity: 2 mV
MDC IDC LEAD IMPLANT DT: 20180309
MDC IDC LEAD LOCATION: 753860
MDC IDC MSMT BATTERY VOLTAGE: 2.99 V
MDC IDC MSMT LEADCHNL LV IMPEDANCE VALUE: 430 Ohm
MDC IDC MSMT LEADCHNL RA PACING THRESHOLD AMPLITUDE: 0.75 V
MDC IDC PG IMPLANT DT: 20180309
MDC IDC PG SERIAL: 8006947
MDC IDC SESS DTM: 20190314084218
MDC IDC SET LEADCHNL LV PACING PULSEWIDTH: 1 ms
MDC IDC SET LEADCHNL RA PACING AMPLITUDE: 2 V
MDC IDC SET LEADCHNL RV PACING AMPLITUDE: 0.25 V
MDC IDC STAT BRADY AS VS PERCENT: 1 %

## 2017-11-26 ENCOUNTER — Ambulatory Visit (INDEPENDENT_AMBULATORY_CARE_PROVIDER_SITE_OTHER): Payer: Medicare Other | Admitting: Thoracic Surgery (Cardiothoracic Vascular Surgery)

## 2017-11-26 ENCOUNTER — Other Ambulatory Visit: Payer: Self-pay

## 2017-11-26 ENCOUNTER — Encounter: Payer: Self-pay | Admitting: Thoracic Surgery (Cardiothoracic Vascular Surgery)

## 2017-11-26 VITALS — BP 103/69 | HR 87 | Resp 18 | Ht 71.0 in | Wt 135.2 lb

## 2017-11-26 DIAGNOSIS — Z9889 Other specified postprocedural states: Secondary | ICD-10-CM | POA: Diagnosis not present

## 2017-11-26 DIAGNOSIS — Z953 Presence of xenogenic heart valve: Secondary | ICD-10-CM | POA: Diagnosis not present

## 2017-11-26 DIAGNOSIS — I2510 Atherosclerotic heart disease of native coronary artery without angina pectoris: Secondary | ICD-10-CM | POA: Diagnosis not present

## 2017-11-26 NOTE — Progress Notes (Signed)
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  The patient's early postoperative recovery was notable for the development of complete heart block and atrial flutter, and he eventually underwent placement of biventricular pacer.  He was last seen here in our office on January 29, 2017 at which time he was doing well.  Shortly after that he underwent follow-up transthoracic echocardiogram which revealed normal functioning bioprosthetic tissue valve in the mitral position with no mitral regurgitation.  The tricuspid valve repair remained intact with no significant tricuspid regurgitation.  There was moderate to severe left ventricular systolic dysfunction with ejection fraction estimated 30-35%.  Since then he has been followed carefully by Dr. Martinique.  He has maintained sinus rhythm with one brief episode of SVT noted when his pacemaker was interrogated in September 2018.  He remains chronically anticoagulated using warfarin.  He returns for office today and reports that he is doing well.  He still gets some  exertional shortness of breath but overall he feels much better than he did prior to surgery.  He states that he only gets short of breath with more strenuous physical exertion he has not had any resting shortness of breath or orthopnea.  He has not had any lower extremity edema.  He denies any palpitations.  He still has an occasional episode of minor pain related to his surgical incision, but this is infrequent and only if he moves suddenly one direction or another.  Overall he feels well.   Current Outpatient Medications  Medication Sig Dispense Refill  . aspirin 81 MG EC tablet Take 1 tablet (81 mg total) by mouth daily. Swallow whole. 30 tablet 3  . carvedilol (COREG) 6.25 MG tablet TAKE 1 TABLET (6.25 MG TOTAL) BY MOUTH 2 (TWO) TIMES DAILY WITH A MEAL. 60 tablet 4  . CVS B-1 100 MG tablet TAKE 1 TABLET BY MOUTH DAILY 30 tablet 9  . folic acid (FOLVITE) 1 MG tablet TAKE 1 TABLET BY MOUTH DAILY 30 tablet 6  . furosemide (LASIX) 40 MG tablet TAKE 1 TABLET BY MOUTH DAILY 30 tablet 8  . hydrALAZINE (APRESOLINE) 25 MG tablet Take 1 tablet (25 mg total) by mouth 3 (three) times daily. 270 tablet 3  . Multiple Vitamin (MULTIVITAMIN) tablet Take 1 tablet by mouth daily.    . potassium chloride SA (K-DUR,KLOR-CON) 20 MEQ tablet Take 1 tablet (20 mEq total) by mouth daily. 30 tablet 12  . traMADol (ULTRAM) 50 MG tablet Take 1 tablet (50 mg total) by mouth 2 (two) times daily between meals as needed. 60 tablet 0  . warfarin (COUMADIN) 5 MG tablet Take 1/2 to  1 tablet daily as directed by coumadin clinic 90 tablet 0  . isosorbide mononitrate (IMDUR) 30 MG 24 hr tablet Take 1 tablet (30 mg total) by mouth daily. 30 tablet 6   No current facility-administered medications for this visit.       Physical Exam:   BP 103/69 (BP Location: Right Arm, Patient Position: Sitting, Cuff Size: Normal)   Pulse 87   Resp 18   Ht 5\' 11"  (1.803 m)   Wt 135 lb 3.2 oz (61.3 kg)   SpO2 95% Comment: RA  BMI 18.86  kg/m   General:  Well-appearing  Chest:   Clear to auscultation  CV:   Regular rate and rhythm without murmur  Incisions:  Completely healed  Abdomen:  Soft nontender  Extremities:  Warm and well-perfused  Diagnostic Tests:  Echocardiography  Patient:    Aaron Bennett, Aaron Bennett MR #:       [id number] Study Date: 01/31/2017 Gender:     M Age:        74 Height:     180.3 cm Weight:     67.6 kg BSA:        1.83 m^2 Pt. Status: Room:   ATTENDING    Peter Martinique, M.D.  ORDERING     Peter Martinique, M.D.  REFERRING    Peter Martinique, M.D.  SONOGRAPHER  Cindy Hazy, RDCS  PERFORMING   [facility], Outpatient  cc:  ------------------------------------------------------------------- LV EF: 30% -   35%  ------------------------------------------------------------------- Indications:      I05.9 Mitral Valve Disorder.  ------------------------------------------------------------------- History:   PMH:  Acquired from the patient and from the patient&'s chart.  PMH:  Congestive Heart Failure. Chronic Kidney Disease. Risk factors:  History of Alcohol Abuse. Hypertension.  ------------------------------------------------------------------- Study Conclusions  - Left ventricle: Diffuse hypokinesis worse in the septum. The   cavity size was moderately dilated. Wall thickness was increased   in a pattern of mild LVH. Systolic function was moderately to   severely reduced. The estimated ejection fraction was in the   range of 30% to 35%. - Aortic valve: There was moderate regurgitation. - Mitral valve: Normal appearing bioprosthetic mitral valve with no   peri valvular regurgitation. - Left atrium: The atrium was mildly dilated. - Right ventricle: The cavity size was moderately dilated. - Right atrium: The atrium was mildly dilated. - Atrial septum: No defect or patent foramen ovale was identified. - Tricuspid valve: Post repair with  ring.  ------------------------------------------------------------------- Labs, prior tests, procedures, and surgery: Status post Mitral Valve Replacement-Bioprosthetic Tissue Valve. Status post Tricuspid Valve Repair.  ------------------------------------------------------------------- Study data:  Comparison was made to the study of 10/30/2016.  Study status:  Routine.  Procedure:  The patient reported no pain pre or post test. Transthoracic echocardiography for left ventricular function evaluation, for right ventricular function evaluation, and for assessment of valvular function. Image quality was adequate. Study completion:  There were no complications. Echocardiography.  M-mode, complete 2D, spectral Doppler, and color Doppler.  Birthdate:  Patient birthdate: [birth year].  Age:  Patient is 67 yr old.  Sex:  Gender: male.    BMI: 20.8 kg/m^2.  Blood pressure:     137/90  Patient status:  Outpatient.  Study date: Study date: 01/31/2017. Study time: 10:45 AM.  Location:  [facility]  -------------------------------------------------------------------  ------------------------------------------------------------------- Left ventricle:  Diffuse hypokinesis worse in the septum. The cavity size was moderately dilated. Wall thickness was increased in a pattern of mild LVH. Systolic function was moderately to severely reduced.  The estimated ejection fraction was in the range of 30% to 35%.  ------------------------------------------------------------------- Aortic valve:   Trileaflet; mildly calcified leaflets.  Doppler: There was moderate regurgitation.  ------------------------------------------------------------------- Aorta:  Ascending aorta: The ascending aorta was mildly dilated.  ------------------------------------------------------------------- Mitral valve:  Normal appearing bioprosthetic mitral valve with no peri valvular regurgitation.  Doppler:     Valve  area by pressure half-time: 1.45 cm^2. Indexed valve area by pressure half-time: 0.79 cm^2/m^2.    Mean gradient (D): 3 mm Hg. Peak gradient (D): 3 mm Hg.  ------------------------------------------------------------------- Left atrium:  The atrium was mildly dilated.  ------------------------------------------------------------------- Atrial septum:  No defect or patent foramen ovale was identified.   ------------------------------------------------------------------- Right ventricle:  The cavity size was moderately dilated. Pacer wire or catheter noted in right ventricle.  ------------------------------------------------------------------- Pulmonic valve:    Doppler:  There was mild regurgitation.  ------------------------------------------------------------------- Tricuspid valve:  Post repair with ring.  Doppler:  There was trivial regurgitation.  ------------------------------------------------------------------- Right atrium:  The atrium was mildly dilated. Pacer wire or catheter noted in right atrium.  ------------------------------------------------------------------- Pericardium:  The pericardium was normal in appearance.  ------------------------------------------------------------------- Systemic veins: Inferior vena cava: The vessel was normal in size. The respirophasic diameter changes were in the normal range (>= 50%), consistent with normal central venous pressure.  ------------------------------------------------------------------- Measurements   Left ventricle                           Value          Reference  LV ID, ED, PLAX chordal          (H)     52.1  mm       43 - 52  LV ID, ES, PLAX chordal          (H)     45.7  mm       23 - 38  LV fx shortening, PLAX chordal   (L)     12    %        >=29  LV PW thickness, ED                      12.5  mm       ---------  IVS/LV PW ratio, ED                      1              <=1.3  LV e&', lateral                            3.95  cm/s     ---------  LV E/e&', lateral                         22.86          ---------  LV e&', medial                            3.73  cm/s     ---------  LV E/e&', medial                          24.21          ---------  LV e&', average  3.84  cm/s     ---------  LV E/e&', average                         23.52          ---------    Ventricular septum                       Value          Reference  IVS thickness, ED                        12.5  mm       ---------    LVOT                                     Value          Reference  LVOT peak velocity, S                    79.7  cm/s     ---------  LVOT mean velocity, S                    60.6  cm/s     ---------  LVOT VTI, S                              15.7  cm       ---------  LVOT peak gradient, S                    3     mm Hg    ---------    Aortic valve                             Value          Reference  Aortic regurg pressure half-time         532   ms       ---------    Aorta                                    Value          Reference  Aortic root ID, ED                       42    mm       ---------  Ascending aorta ID, A-P, S               42    mm       ---------    Left atrium                              Value          Reference  LA ID, A-P, ES                           40    mm       ---------  LA ID/bsa, A-P  2.18  cm/m^2   <=2.2  LA volume, S                             109   ml       ---------  LA volume/bsa, S                         59.4  ml/m^2   ---------  LA volume, ES, 1-p A4C                   110   ml       ---------  LA volume/bsa, ES, 1-p A4C               60    ml/m^2   ---------  LA volume, ES, 1-p A2C                   105   ml       ---------  LA volume/bsa, ES, 1-p A2C               57.3  ml/m^2   ---------    Mitral valve                             Value          Reference  Mitral E-wave peak velocity              90.3   cm/s     ---------  Mitral A-wave peak velocity              115   cm/s     ---------  Mitral mean velocity, D                  88.2  cm/s     ---------  Mitral deceleration time         (H)     511   ms       150 - 230  Mitral pressure half-time                152   ms       ---------  Mitral mean gradient, D                  3     mm Hg    ---------  Mitral peak gradient, D                  3     mm Hg    ---------  Mitral E/A ratio, peak                   0.8            ---------  Mitral valve area, PHT, DP               1.45  cm^2     ---------  Mitral valve area/bsa, PHT, DP           0.79  cm^2/m^2 ---------  Mitral annulus VTI, D                    36.1  cm       ---------    Tricuspid valve  Value          Reference  Tricuspid regurg peak velocity           323   cm/s     ---------  Tricuspid peak RV-RA gradient            42    mm Hg    ---------    Right ventricle                          Value          Reference  RV s&', lateral, S                        9.02  cm/s     ---------  Legend: (L)  and  (H)  mark values outside specified reference range.  ------------------------------------------------------------------- Prepared and Electronically Authenticated by  Jenkins Rouge, M.D. 2018-06-06T16:22:21   Impression:  Patient is doing well more than 1 year status post minimally invasive mitral valve replacement using a bioprosthetic tissue valve and tricuspid valve repair.  Plan:  We have not recommended any change the patient's current medications.  Whether or not the patient needs to remain on long-term anticoagulation we will defer to Dr. Martinique and his colleagues based upon interrogation of his permanent pacemaker.  I have encouraged the patient to continue to increase his activity without any particular limitations.  In the future he will call and return to see [place] only should specific problems or questions arise.  I spent in excess of 15  minutes during the conduct of this office consultation and >50% of this time involved direct face-to-face encounter with the patient for counseling and/or coordination of their care.    Valentina Gu. Roxy Manns, MD 11/26/2017 11:07 AM

## 2017-11-26 NOTE — Patient Instructions (Addendum)
Continue all previous medications without any changes at this time  Endocarditis is a potentially serious infection of heart valves or inside lining of the heart.  It occurs more commonly in patients with diseased heart valves (such as patient's with aortic or mitral valve disease) and in patients who have undergone heart valve repair or replacement.  Certain surgical and dental procedures may put you at risk, such as dental cleaning, other dental procedures, or any surgery involving the respiratory, urinary, gastrointestinal tract, gallbladder or prostate gland.   To minimize your chances for develooping endocarditis, maintain good oral health and seek prompt medical attention for any infections involving the mouth, teeth, gums, skin or urinary tract.    Always notify your doctor or dentist about your underlying heart valve condition before having any invasive procedures. You will need to take antibiotics before certain procedures, including all routine dental cleanings or other dental procedures.  Your cardiologist or dentist should prescribe these antibiotics for you to be taken ahead of time.  You may resume unrestricted physical activity without any particular limitations at this time.  You may use any over the counter pain relievers or topical anesthetic patches (Icy Hot) as needed for occasional pain related to your surgical incision

## 2017-12-11 ENCOUNTER — Ambulatory Visit (INDEPENDENT_AMBULATORY_CARE_PROVIDER_SITE_OTHER): Payer: Medicare Other | Admitting: *Deleted

## 2017-12-11 DIAGNOSIS — Z5181 Encounter for therapeutic drug level monitoring: Secondary | ICD-10-CM

## 2017-12-11 DIAGNOSIS — Z7901 Long term (current) use of anticoagulants: Secondary | ICD-10-CM | POA: Diagnosis not present

## 2017-12-11 DIAGNOSIS — Z953 Presence of xenogenic heart valve: Secondary | ICD-10-CM

## 2017-12-11 DIAGNOSIS — Z9889 Other specified postprocedural states: Secondary | ICD-10-CM | POA: Diagnosis not present

## 2017-12-11 LAB — POCT INR: INR: 1.6

## 2017-12-11 NOTE — Patient Instructions (Signed)
Take coumadin 1 1/2 tablets tonight then resume 1 tablet each Monday, Wednesday and Friday, 1/2 tablet all other days.  Repeat INR in 3 weeks

## 2017-12-28 ENCOUNTER — Other Ambulatory Visit: Payer: Self-pay | Admitting: Internal Medicine

## 2017-12-28 ENCOUNTER — Other Ambulatory Visit: Payer: Self-pay | Admitting: Cardiology

## 2017-12-28 NOTE — Telephone Encounter (Signed)
Rx sent to pharmacy   

## 2018-01-26 DEATH — deceased

## 2018-02-07 ENCOUNTER — Telehealth: Payer: Self-pay | Admitting: Cardiology

## 2018-02-07 ENCOUNTER — Encounter: Payer: Self-pay | Admitting: *Deleted

## 2018-02-07 NOTE — Telephone Encounter (Signed)
Attempted to confirm remote transmission with pt. No answer and was unable to leave a message.   

## 2018-02-11 ENCOUNTER — Encounter: Payer: Self-pay | Admitting: Cardiology

## 2018-02-13 ENCOUNTER — Other Ambulatory Visit: Payer: Self-pay | Admitting: Internal Medicine

## 2018-03-21 ENCOUNTER — Encounter: Payer: Self-pay | Admitting: Cardiology

## 2018-06-04 ENCOUNTER — Ambulatory Visit: Payer: Self-pay | Admitting: Pharmacist Clinician (PhC)/ Clinical Pharmacy Specialist

## 2018-06-04 DIAGNOSIS — Z7901 Long term (current) use of anticoagulants: Principal | ICD-10-CM

## 2018-06-04 DIAGNOSIS — Z9889 Other specified postprocedural states: Secondary | ICD-10-CM

## 2018-06-04 DIAGNOSIS — Z5181 Encounter for therapeutic drug level monitoring: Secondary | ICD-10-CM

## 2018-06-04 DIAGNOSIS — Z953 Presence of xenogenic heart valve: Secondary | ICD-10-CM

## 2018-06-19 ENCOUNTER — Encounter: Payer: Self-pay | Admitting: Cardiology

## 2019-02-25 IMAGING — CR DG CHEST 1V PORT
1 series · 1 of 1 positions shown · non-contrast
Comparison: 10/26/2016.

CLINICAL DATA: Chest tube.  CHF.

EXAM:
PORTABLE CHEST 1 VIEW

[AP]
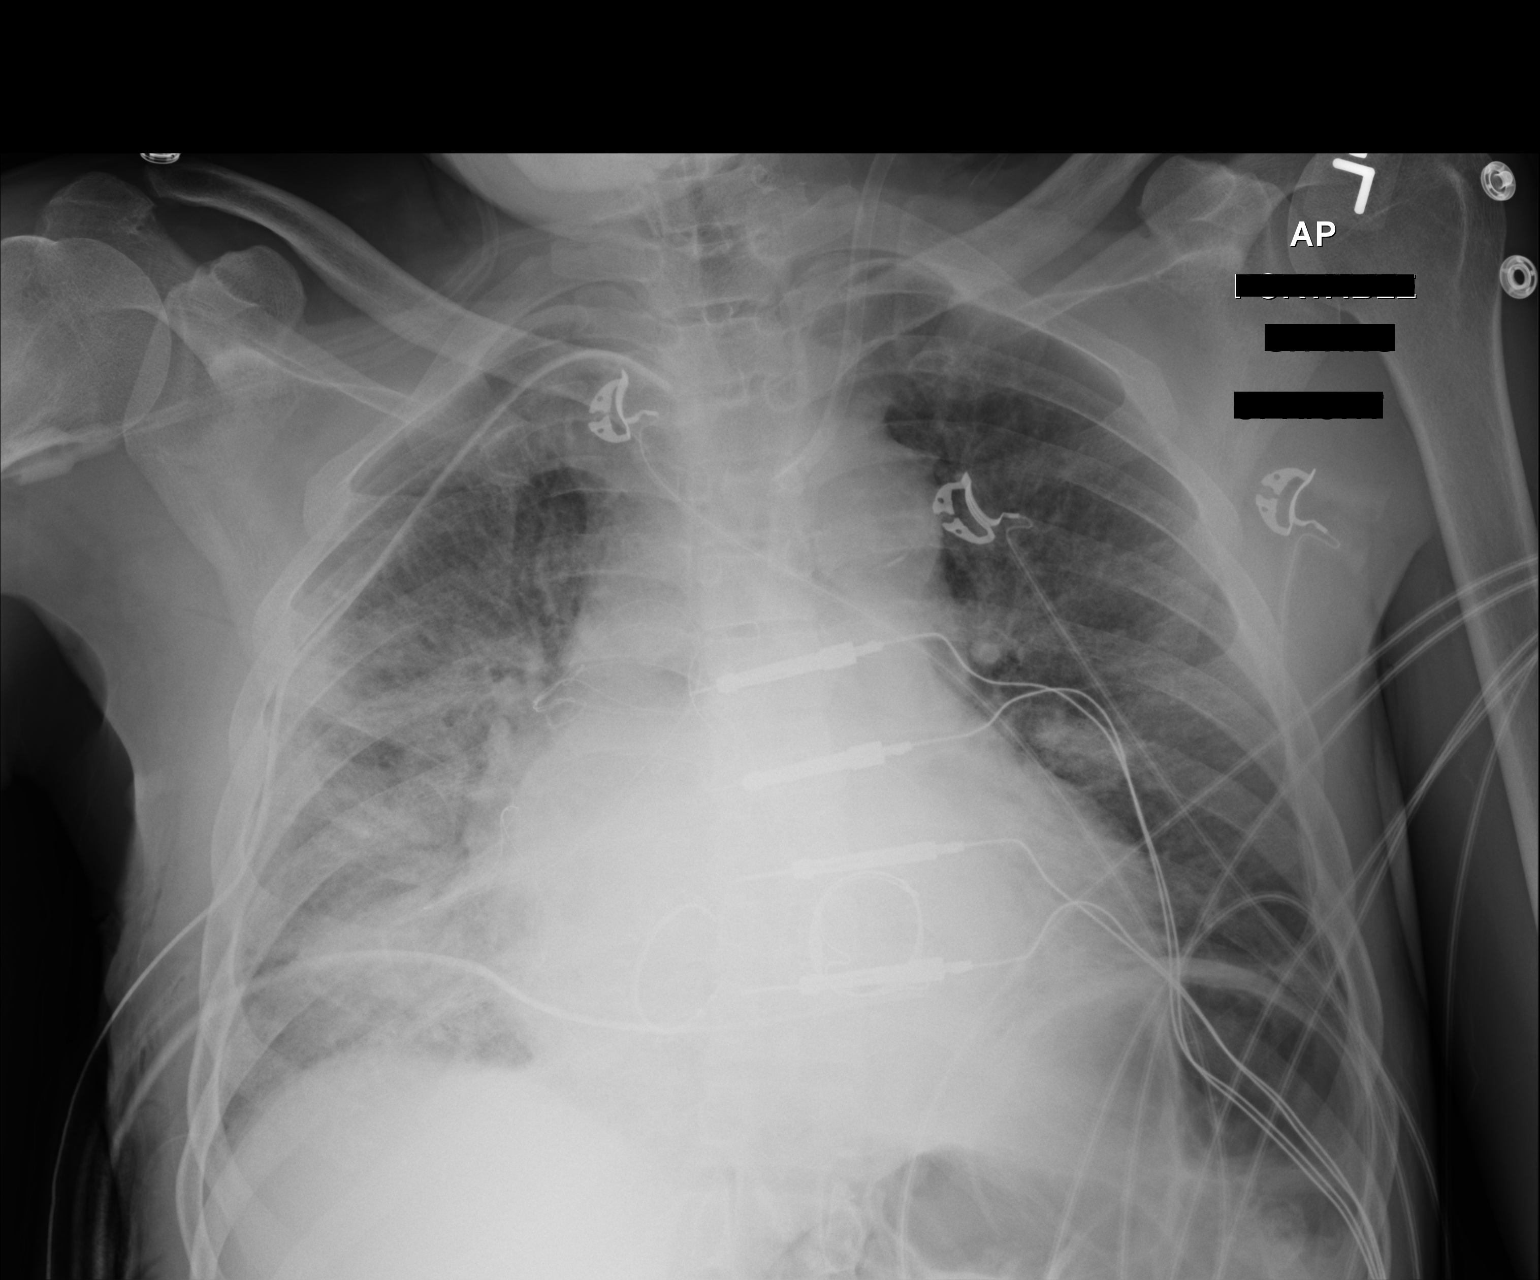

[1 of 1 positions shown; findings below may reference images not displayed]

FINDINGS: Interim extubation and removal of Swan-Ganz catheter. Left IJ
sheath, right chest tube, and mediastinal drainage catheter stable
position . Prior cardiac valve replacement. Cardiomegaly with
bilateral pulmonary infiltrates consistent pulmonary edema.
Improvement from prior exam. Small right pleural effusion. Improved
from prior exam . No pneumothorax.
IMPRESSION: 1. Interim extubation and removal of NG tube and Swan-Ganz catheter.
Remaining lines and tubes including right chest tube in stable
position. No pneumothorax.

2. Prior cardiac valve replacements. Cardiomegaly with bilateral
pulmonary infiltrates consistent pulmonary edema. Small right
pleural effusion. These findings have improved from prior exam .

## 2019-02-27 IMAGING — DX DG CHEST 1V PORT
1 series · 1 of 1 positions shown · non-contrast
Comparison: Radiograph October 28, 2016.

CLINICAL DATA: Status post mitral valve repair.

EXAM:
PORTABLE CHEST 1 VIEW

[chest ap]
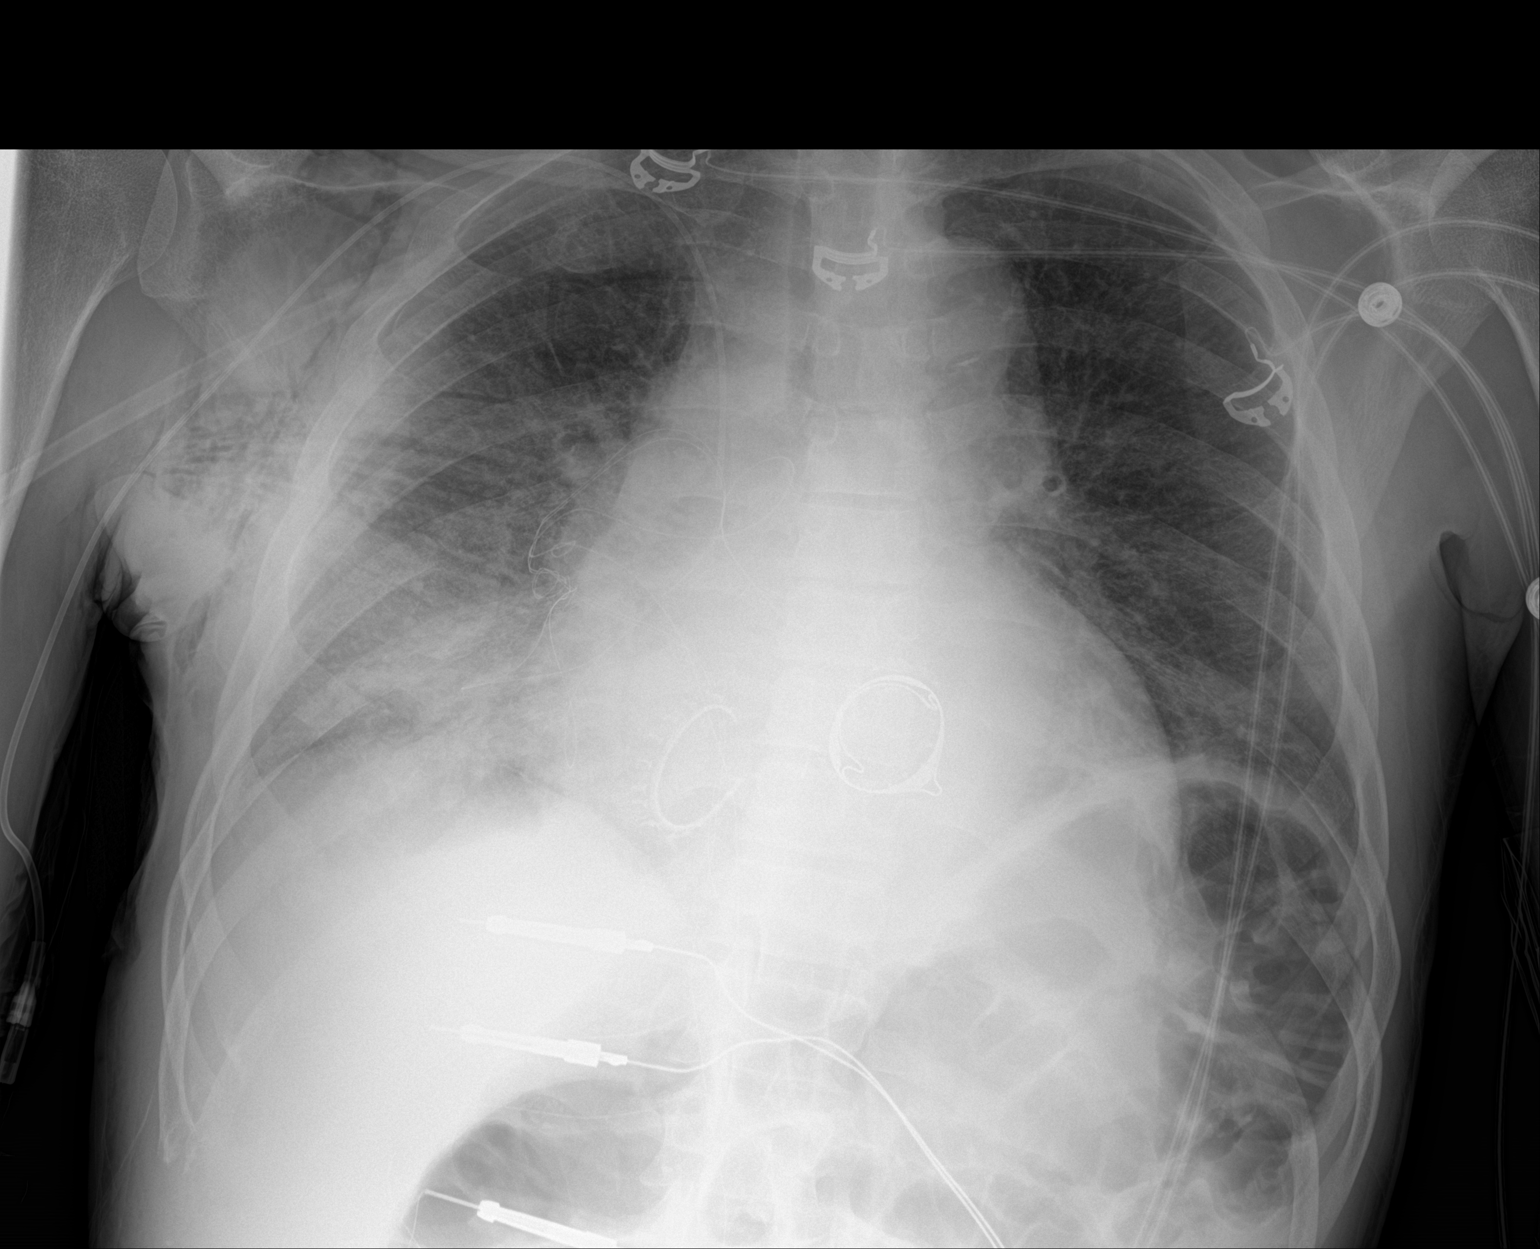

[1 of 1 positions shown; findings below may reference images not displayed]

FINDINGS: Stable cardiomediastinal silhouette. Status post cardiac valve
repair. Atherosclerosis of thoracic aorta is noted. Minimal
pneumothorax is noted laterally in right upper lobe. Increased
subcutaneous emphysema is seen over right lateral chest wall.
Right-sided chest tube has been removed. Right-sided PICC line is
unchanged in position. Mild left basilar subsegmental atelectasis.
Stable right basilar atelectasis is noted. Bony thorax is
unremarkable.
IMPRESSION: Aortic atherosclerosis. Minimal pneumothorax is noted in right upper
lobe laterally status post chest tube removal. Increased
subcutaneous emphysema is seen over right lateral chest wall.

## 2019-02-28 IMAGING — CR DG CHEST 1V PORT
1 series · 1 of 1 positions shown · non-contrast
Comparison: Portable chest x-ray October 29, 2016

CLINICAL DATA: Sore chest. Status post mitral valve replacement and
tricuspid valve repair.

EXAM:
PORTABLE CHEST 1 VIEW

[AP]
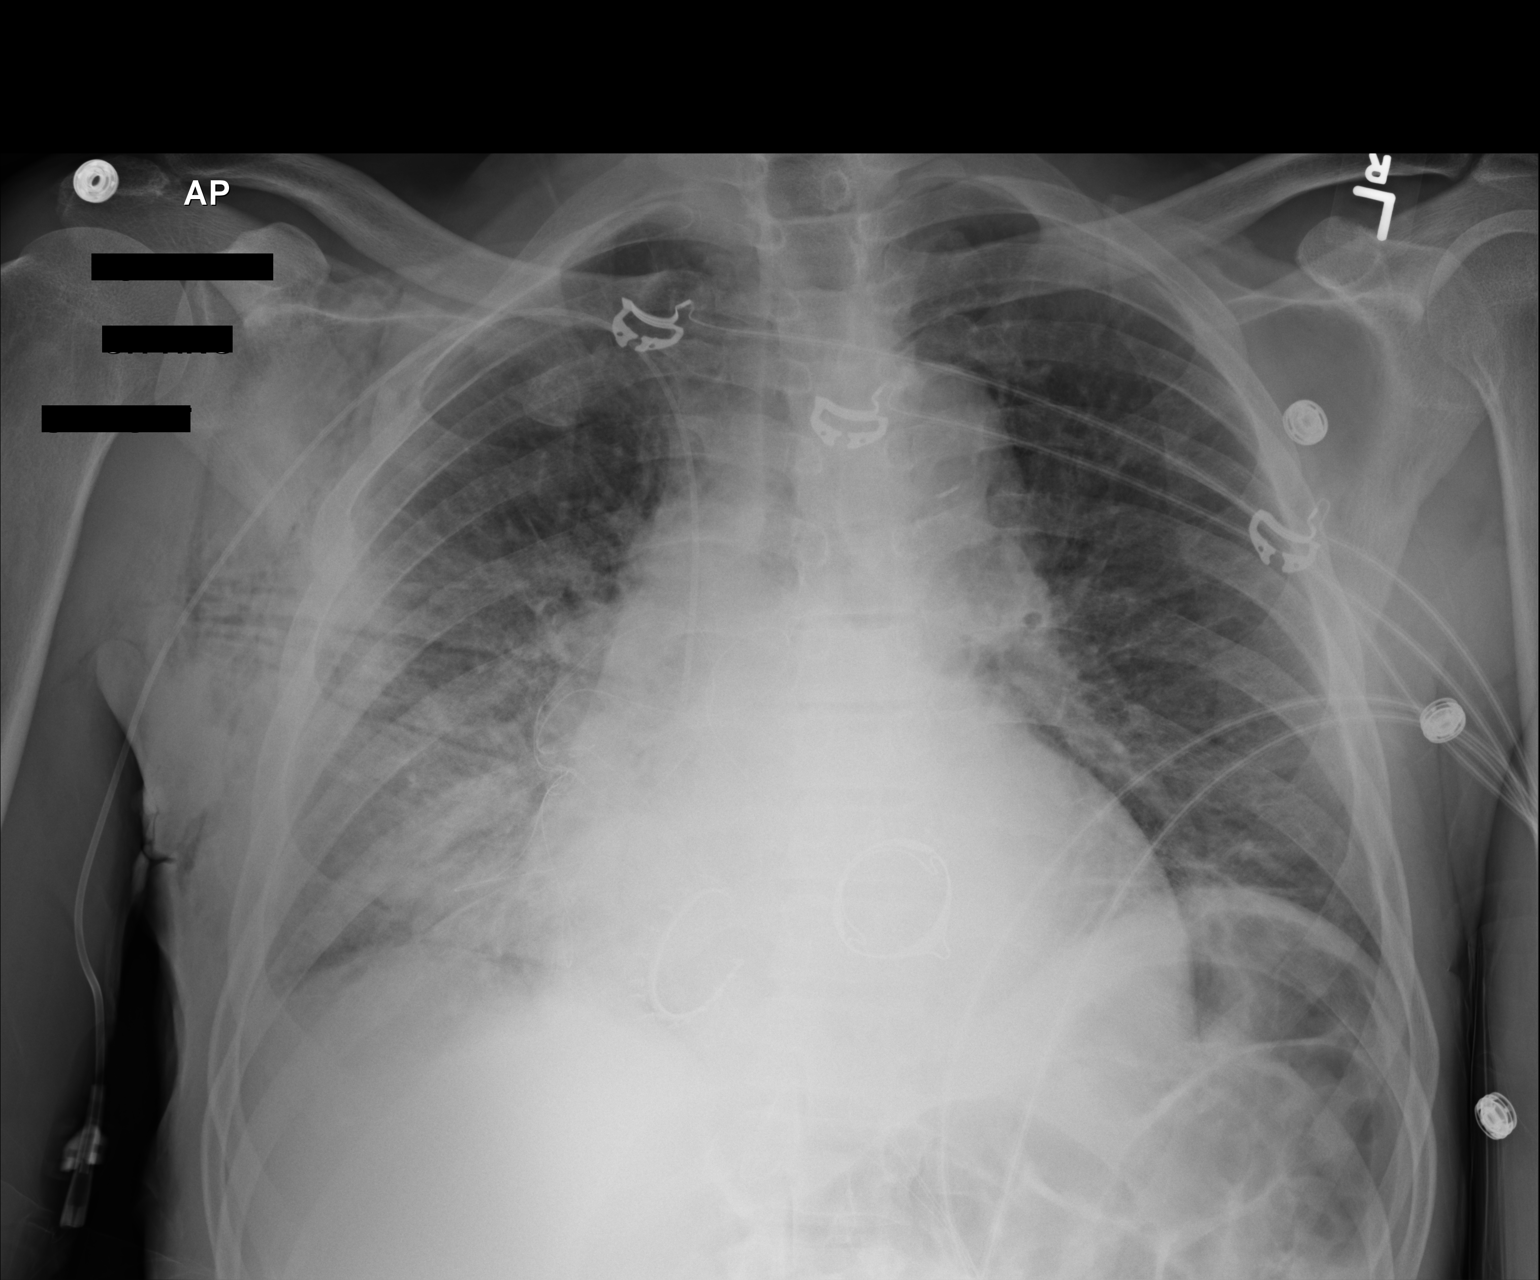

[1 of 1 positions shown; findings below may reference images not displayed]

FINDINGS: There is persistent subcutaneous emphysema on the right. No definite
right-sided pneumothorax nor pneumomediastinum is observed. There is
a small right pleural effusion. The interstitial markings of both
lungs are increased. Increased density in the right lower lung
persists. The cardiac silhouette remains enlarged. The pulmonary
vascularity is more engorged than on the previous study. There is
calcification in the wall of the aortic arch. The bony thorax
exhibits no acute abnormality.
IMPRESSION: Persistent right-sided subcutaneous emphysema without definite
pneumothorax or pneumomediastinum. Slight interval increase
conspicuity of the pulmonary interstitium suggests low-grade
interstitial edema. Persistent right lower lobe atelectasis.

Thoracic aortic atherosclerosis.

## 2019-03-02 IMAGING — DX DG CHEST 2V
2 series · 2 of 2 positions shown · non-contrast
Comparison: Chest radiograph October 30, 2016

CLINICAL DATA: Follow-up pneumothorax.

EXAM:
CHEST  2 VIEW

[chest lat]
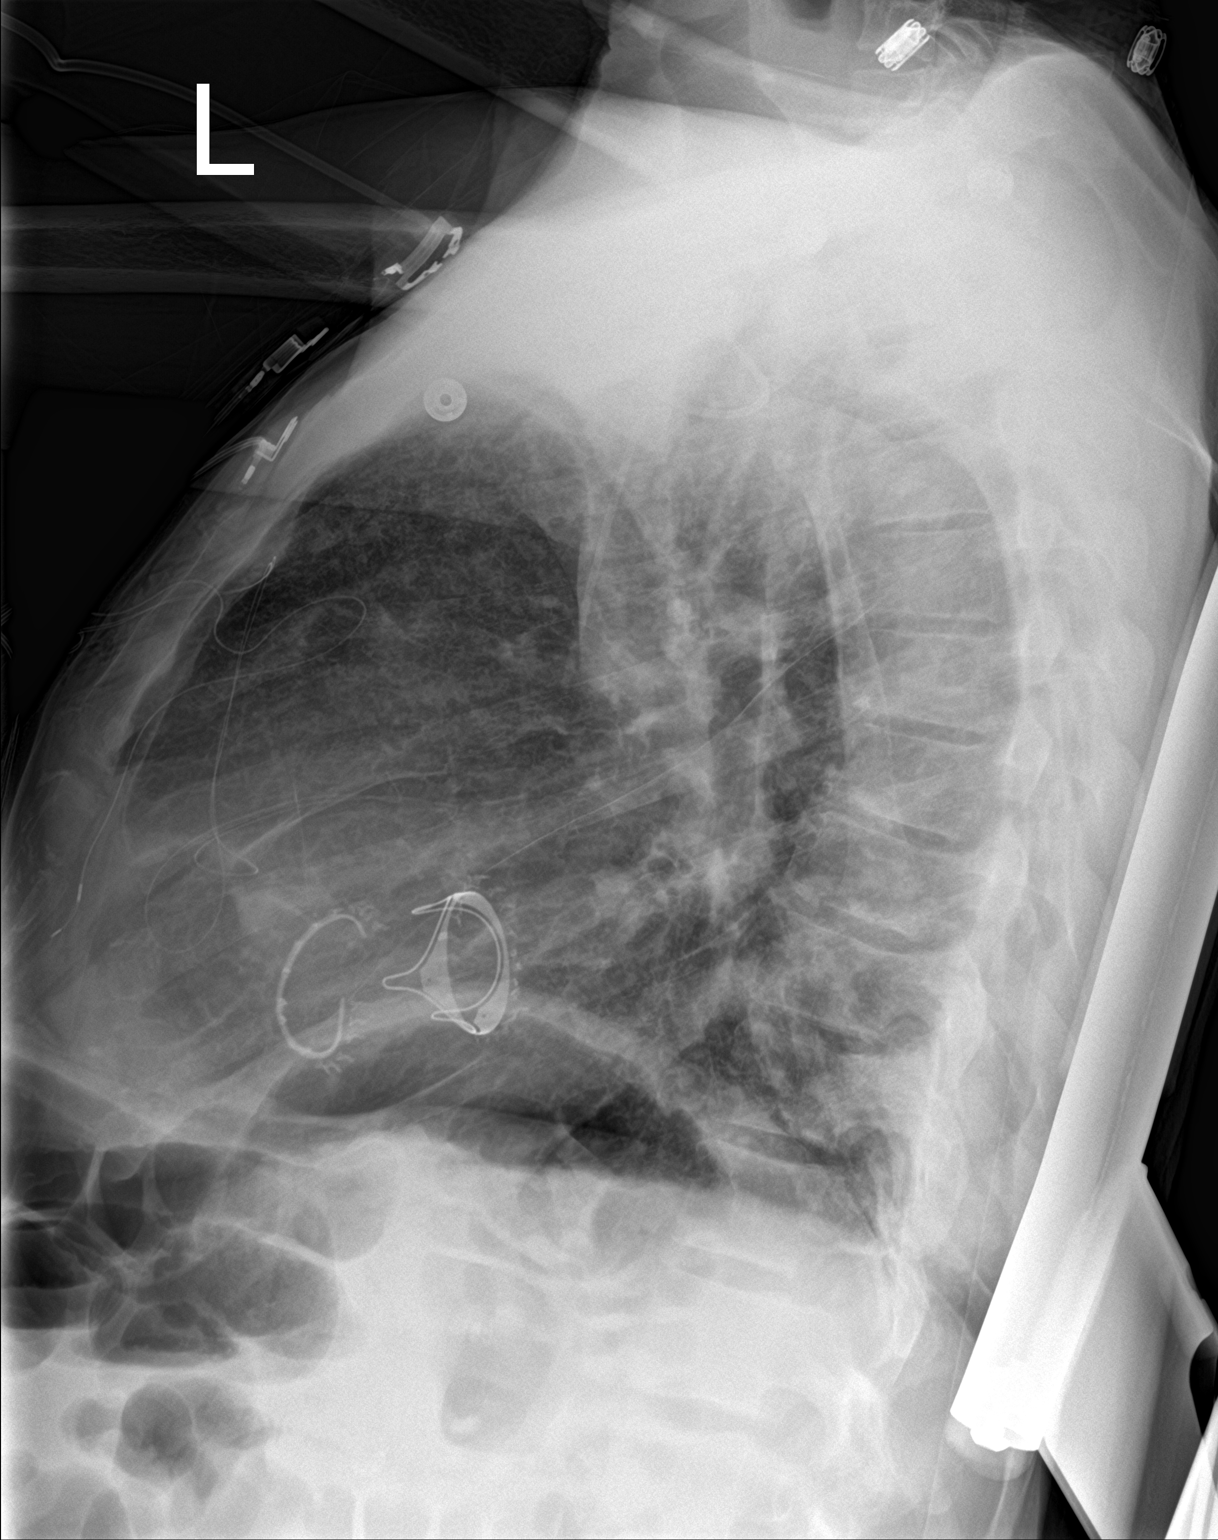

[chest ap]
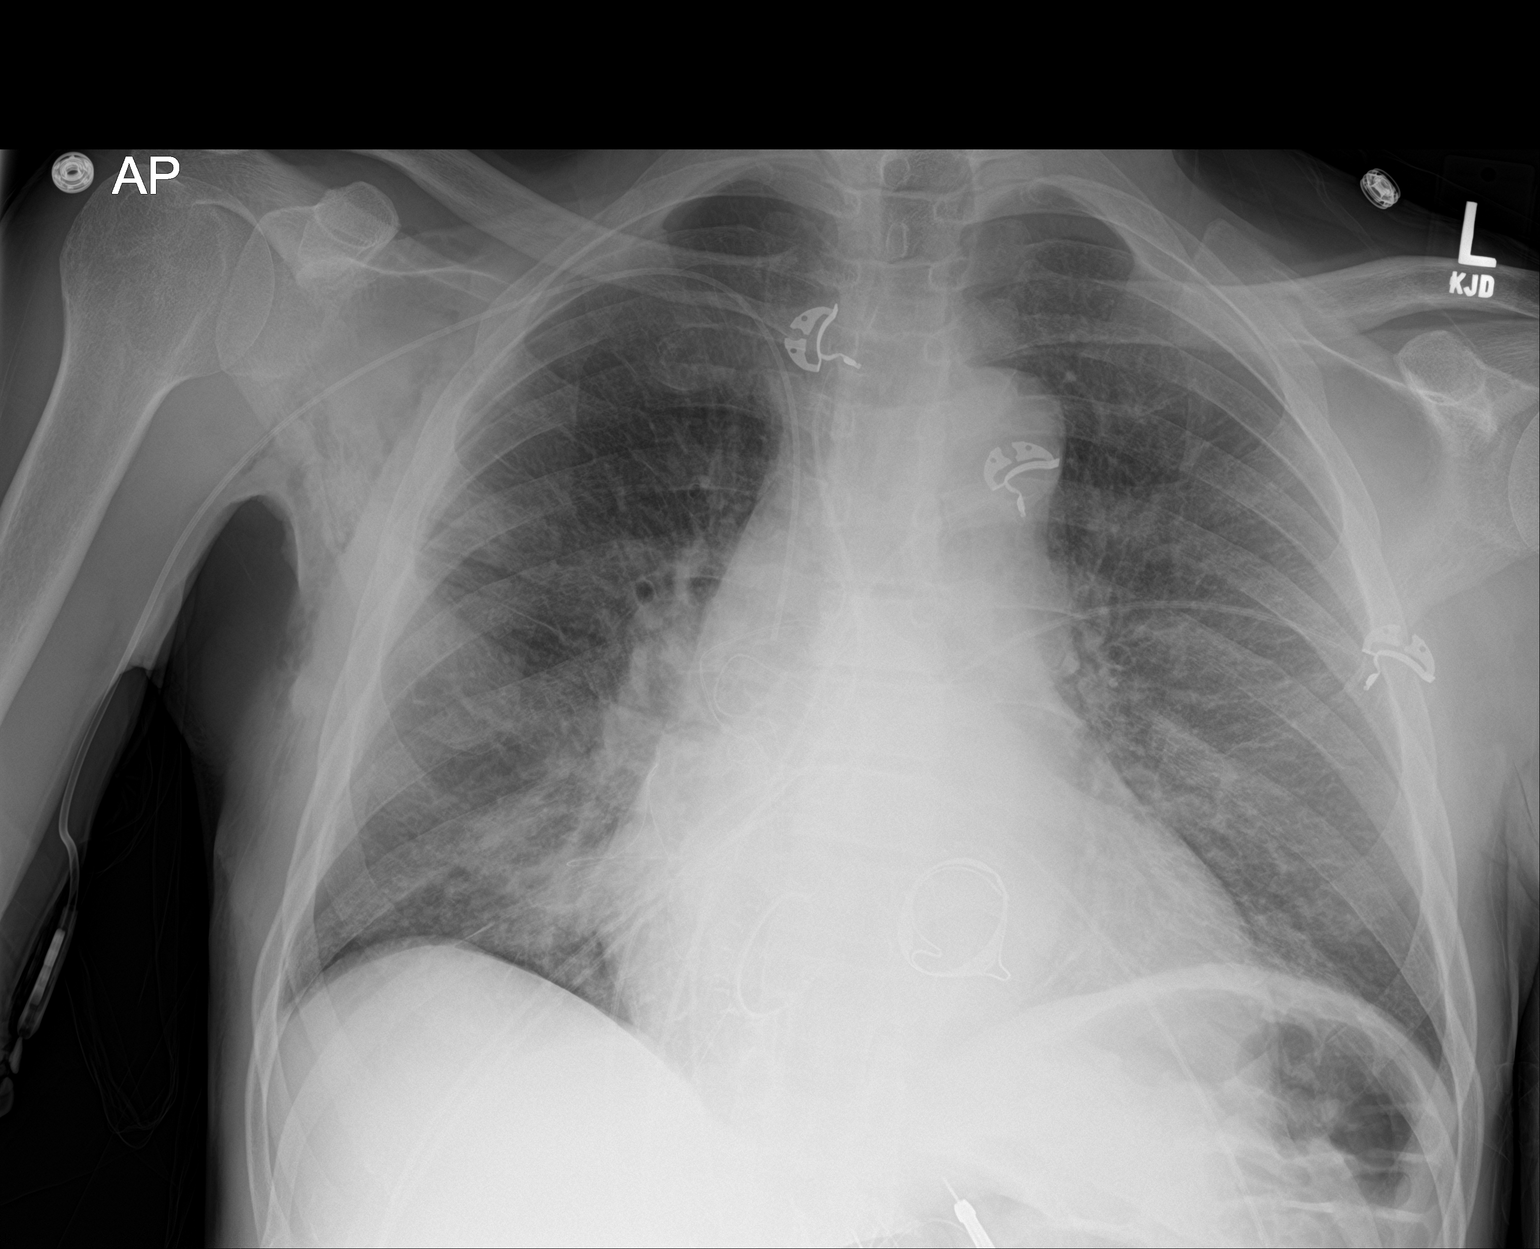

[2 of 2 positions shown; findings below may reference images not displayed]

FINDINGS: Trace medial and RIGHT basilar suspected pneumothorax. No
mediastinal shift. Cardiac silhouette is similarly enlarged, status
post cardiac valve replacement. Mediastinal silhouette is
nonsuspicious. Decreased interstitial and alveolar airspace
opacities. No pleural effusion. RIGHT PICC distal tip projects in
mid superior vena cava. Similar RIGHT chest wall subcutaneous
emphysema.
IMPRESSION: Trace suspected RIGHT medial and basilar pneumothorax.

Stable cardiomegaly. Increasing interstitial and alveolar airspace
opacities.

Stable appearance of RIGHT PICC.
